# Patient Record
Sex: Female | Born: 1950 | Race: White | Hispanic: No | Smoking: Never smoker
Health system: Southern US, Community
[De-identification: ages and names within clinical notes are randomized; demographics above are authoritative.]

## PROBLEM LIST (undated history)

## (undated) DIAGNOSIS — E785 Hyperlipidemia, unspecified: Secondary | ICD-10-CM

## (undated) DIAGNOSIS — K219 Gastro-esophageal reflux disease without esophagitis: Secondary | ICD-10-CM

## (undated) DIAGNOSIS — Z8719 Personal history of other diseases of the digestive system: Secondary | ICD-10-CM

## (undated) DIAGNOSIS — I10 Essential (primary) hypertension: Secondary | ICD-10-CM

## (undated) DIAGNOSIS — M797 Fibromyalgia: Secondary | ICD-10-CM

## (undated) DIAGNOSIS — M1990 Unspecified osteoarthritis, unspecified site: Secondary | ICD-10-CM

## (undated) HISTORY — DX: Fibromyalgia: M79.7

## (undated) HISTORY — DX: Hyperlipidemia, unspecified: E78.5

## (undated) HISTORY — DX: Gastro-esophageal reflux disease without esophagitis: K21.9

## (undated) HISTORY — PX: TUBAL LIGATION: SHX77

## (undated) HISTORY — PX: UPPER GASTROINTESTINAL ENDOSCOPY: SHX188

## (undated) HISTORY — PX: CARPAL TUNNEL RELEASE: SHX101

## (undated) HISTORY — PX: OTHER SURGICAL HISTORY: SHX169

## (undated) HISTORY — PX: JOINT REPLACEMENT: SHX530

## (undated) HISTORY — PX: REDUCTION MAMMAPLASTY: SUR839

## (undated) HISTORY — PX: CHOLECYSTECTOMY: SHX55

## (undated) HISTORY — DX: Essential (primary) hypertension: I10

---

## 1997-12-29 ENCOUNTER — Other Ambulatory Visit: Admission: RE | Admit: 1997-12-29 | Discharge: 1997-12-29 | Payer: Self-pay | Admitting: Otolaryngology

## 1998-10-02 ENCOUNTER — Other Ambulatory Visit: Admission: RE | Admit: 1998-10-02 | Discharge: 1998-10-02 | Payer: Self-pay | Admitting: Family Medicine

## 1998-11-11 ENCOUNTER — Encounter: Payer: Self-pay | Admitting: Family Medicine

## 1998-11-11 ENCOUNTER — Encounter: Admission: RE | Admit: 1998-11-11 | Discharge: 1998-11-11 | Payer: Self-pay | Admitting: Family Medicine

## 1999-01-18 HISTORY — PX: COLONOSCOPY: SHX174

## 1999-06-23 ENCOUNTER — Encounter: Payer: Self-pay | Admitting: Emergency Medicine

## 1999-06-23 ENCOUNTER — Emergency Department (HOSPITAL_COMMUNITY): Admission: EM | Admit: 1999-06-23 | Discharge: 1999-06-23 | Payer: Self-pay | Admitting: Emergency Medicine

## 1999-11-02 ENCOUNTER — Other Ambulatory Visit: Admission: RE | Admit: 1999-11-02 | Discharge: 1999-11-02 | Payer: Self-pay | Admitting: Family Medicine

## 1999-11-16 ENCOUNTER — Other Ambulatory Visit: Admission: RE | Admit: 1999-11-16 | Discharge: 1999-11-16 | Payer: Self-pay | Admitting: Gastroenterology

## 1999-11-16 ENCOUNTER — Encounter (INDEPENDENT_AMBULATORY_CARE_PROVIDER_SITE_OTHER): Payer: Self-pay

## 1999-11-17 ENCOUNTER — Ambulatory Visit (HOSPITAL_BASED_OUTPATIENT_CLINIC_OR_DEPARTMENT_OTHER): Admission: RE | Admit: 1999-11-17 | Discharge: 1999-11-17 | Payer: Self-pay | Admitting: *Deleted

## 1999-11-23 ENCOUNTER — Encounter: Payer: Self-pay | Admitting: Family Medicine

## 1999-11-23 ENCOUNTER — Encounter: Admission: RE | Admit: 1999-11-23 | Discharge: 1999-11-23 | Payer: Self-pay | Admitting: Family Medicine

## 2000-04-04 ENCOUNTER — Other Ambulatory Visit: Admission: RE | Admit: 2000-04-04 | Discharge: 2000-04-04 | Payer: Self-pay | Admitting: Family Medicine

## 2000-08-17 ENCOUNTER — Encounter: Payer: Self-pay | Admitting: Internal Medicine

## 2000-08-17 ENCOUNTER — Ambulatory Visit (HOSPITAL_COMMUNITY): Admission: RE | Admit: 2000-08-17 | Discharge: 2000-08-17 | Payer: Self-pay | Admitting: Internal Medicine

## 2000-09-05 ENCOUNTER — Encounter: Admission: RE | Admit: 2000-09-05 | Discharge: 2000-09-05 | Payer: Self-pay | Admitting: Family Medicine

## 2000-09-05 ENCOUNTER — Encounter: Payer: Self-pay | Admitting: Family Medicine

## 2001-02-05 ENCOUNTER — Other Ambulatory Visit: Admission: RE | Admit: 2001-02-05 | Discharge: 2001-02-05 | Payer: Self-pay | Admitting: Dermatology

## 2001-04-23 ENCOUNTER — Other Ambulatory Visit: Admission: RE | Admit: 2001-04-23 | Discharge: 2001-04-23 | Payer: Self-pay | Admitting: Family Medicine

## 2001-10-19 ENCOUNTER — Encounter: Admission: RE | Admit: 2001-10-19 | Discharge: 2001-10-19 | Payer: Self-pay | Admitting: Family Medicine

## 2001-10-19 ENCOUNTER — Encounter: Payer: Self-pay | Admitting: Family Medicine

## 2001-11-15 ENCOUNTER — Encounter: Payer: Self-pay | Admitting: Rheumatology

## 2001-11-15 ENCOUNTER — Encounter (HOSPITAL_COMMUNITY): Admission: RE | Admit: 2001-11-15 | Discharge: 2001-12-15 | Payer: Self-pay | Admitting: Rheumatology

## 2002-02-21 ENCOUNTER — Encounter (HOSPITAL_COMMUNITY): Admission: RE | Admit: 2002-02-21 | Discharge: 2002-03-23 | Payer: Self-pay | Admitting: Rheumatology

## 2002-04-25 ENCOUNTER — Encounter: Payer: Self-pay | Admitting: Rheumatology

## 2002-04-25 ENCOUNTER — Encounter (HOSPITAL_COMMUNITY): Admission: RE | Admit: 2002-04-25 | Discharge: 2002-05-25 | Payer: Self-pay | Admitting: Rheumatology

## 2002-05-14 ENCOUNTER — Other Ambulatory Visit: Admission: RE | Admit: 2002-05-14 | Discharge: 2002-05-14 | Payer: Self-pay | Admitting: Obstetrics and Gynecology

## 2002-05-22 ENCOUNTER — Other Ambulatory Visit: Admission: RE | Admit: 2002-05-22 | Discharge: 2002-05-22 | Payer: Self-pay | Admitting: Dermatology

## 2002-07-25 ENCOUNTER — Encounter (HOSPITAL_COMMUNITY): Admission: RE | Admit: 2002-07-25 | Discharge: 2002-08-24 | Payer: Self-pay | Admitting: Rheumatology

## 2002-09-12 ENCOUNTER — Encounter (HOSPITAL_COMMUNITY): Admission: RE | Admit: 2002-09-12 | Discharge: 2002-10-12 | Payer: Self-pay | Admitting: Rheumatology

## 2002-11-07 ENCOUNTER — Encounter (HOSPITAL_COMMUNITY): Admission: RE | Admit: 2002-11-07 | Discharge: 2002-12-07 | Payer: Self-pay | Admitting: Rheumatology

## 2003-05-19 ENCOUNTER — Encounter: Admission: RE | Admit: 2003-05-19 | Discharge: 2003-05-19 | Payer: Self-pay | Admitting: Obstetrics and Gynecology

## 2003-06-05 ENCOUNTER — Other Ambulatory Visit: Admission: RE | Admit: 2003-06-05 | Discharge: 2003-06-05 | Payer: Self-pay | Admitting: Obstetrics and Gynecology

## 2004-06-21 ENCOUNTER — Other Ambulatory Visit: Admission: RE | Admit: 2004-06-21 | Discharge: 2004-06-21 | Payer: Self-pay | Admitting: Obstetrics and Gynecology

## 2004-06-28 ENCOUNTER — Encounter: Admission: RE | Admit: 2004-06-28 | Discharge: 2004-06-28 | Payer: Self-pay | Admitting: Obstetrics and Gynecology

## 2005-07-19 ENCOUNTER — Encounter: Admission: RE | Admit: 2005-07-19 | Discharge: 2005-07-19 | Payer: Self-pay | Admitting: Obstetrics and Gynecology

## 2005-08-03 ENCOUNTER — Encounter: Admission: RE | Admit: 2005-08-03 | Discharge: 2005-08-03 | Payer: Self-pay | Admitting: Obstetrics and Gynecology

## 2007-01-22 ENCOUNTER — Emergency Department (HOSPITAL_COMMUNITY): Admission: EM | Admit: 2007-01-22 | Discharge: 2007-01-23 | Payer: Self-pay | Admitting: Family Medicine

## 2008-09-08 ENCOUNTER — Ambulatory Visit (HOSPITAL_COMMUNITY): Admission: RE | Admit: 2008-09-08 | Discharge: 2008-09-08 | Payer: Self-pay | Admitting: Obstetrics and Gynecology

## 2009-05-15 ENCOUNTER — Emergency Department (HOSPITAL_COMMUNITY): Admission: EM | Admit: 2009-05-15 | Discharge: 2009-05-15 | Payer: Self-pay | Admitting: Emergency Medicine

## 2009-08-31 ENCOUNTER — Ambulatory Visit: Payer: Self-pay | Admitting: Obstetrics & Gynecology

## 2009-09-01 ENCOUNTER — Encounter: Payer: Self-pay | Admitting: Obstetrics & Gynecology

## 2009-09-01 LAB — CONVERTED CEMR LAB

## 2009-09-04 ENCOUNTER — Ambulatory Visit (HOSPITAL_COMMUNITY): Admission: RE | Admit: 2009-09-04 | Discharge: 2009-09-04 | Payer: Self-pay | Admitting: Family Medicine

## 2009-09-11 ENCOUNTER — Ambulatory Visit (HOSPITAL_COMMUNITY): Admission: RE | Admit: 2009-09-11 | Discharge: 2009-09-11 | Payer: Self-pay | Admitting: Obstetrics and Gynecology

## 2009-09-17 ENCOUNTER — Ambulatory Visit: Payer: Self-pay | Admitting: Obstetrics & Gynecology

## 2010-04-06 LAB — URINALYSIS, ROUTINE W REFLEX MICROSCOPIC
Glucose, UA: NEGATIVE mg/dL
Ketones, ur: NEGATIVE mg/dL
Nitrite: NEGATIVE
Urobilinogen, UA: 0.2 mg/dL (ref 0.0–1.0)
pH: 6.5 (ref 5.0–8.0)

## 2010-04-06 LAB — DIFFERENTIAL
Basophils Absolute: 0 10*3/uL (ref 0.0–0.1)
Basophils Relative: 1 % (ref 0–1)
Eosinophils Relative: 0 % (ref 0–5)
Lymphs Abs: 3.1 10*3/uL (ref 0.7–4.0)
Monocytes Absolute: 0.6 10*3/uL (ref 0.1–1.0)
Neutro Abs: 4.4 10*3/uL (ref 1.7–7.7)

## 2010-04-06 LAB — BASIC METABOLIC PANEL
Chloride: 106 mEq/L (ref 96–112)
Creatinine, Ser: 0.79 mg/dL (ref 0.4–1.2)
GFR calc Af Amer: >60 mL/min (ref >60)
Potassium: 3.7 mEq/L (ref 3.5–5.1)

## 2010-04-06 LAB — CBC
MCHC: 35.5 g/dL (ref 30.0–36.0)
MCV: 88.4 fL (ref 78.0–100.0)
WBC: 8.2 10*3/uL (ref 4.0–10.5)

## 2010-04-06 LAB — URINE MICROSCOPIC-ADD ON

## 2010-06-01 NOTE — Consult Note (Signed)
NAMECHARLIEE, KRENZ NO.:  [id number]   MEDICAL RECORD NO.:  [id number]          PATIENT TYPE:  EMS   LOCATION:  [facility]JO                         FACILITY:  [facility]   PHYSICIAN:  Alvy Beal, MD    DATE OF BIRTH:  04-25-1950   DATE OF CONSULTATION:  01/23/2007  DATE OF DISCHARGE:                                 CONSULTATION   HISTORY:  She was seen at urgent care after a fall while cleaning out  the gutters.  She presented there to Dr. Penni Bombard with complaints of  significant sacral pain.  X-rays demonstrated a sacral S4 fracture.  I  was asked to evaluate the patient.  I reviewed the films and I indicated  that she needed a CT scan.  The patient was sent to the emergency room,  a CT scan was done, and I evaluated the patient in the ER.   At this point in time, she has no other significant complaints, just the  gluteal pain.   PAST MEDICAL SURGICAL FAMILY SOCIAL HISTORY:  She has no known drug  allergies.  She has a history of hypertension, arthritis, hiatal hernia,  hyperlipidemia.  She has had a carpal tunnel release, cholecystectomy,  tubal ligation.  She is a nonsmoker.   She is on no current medications.   Per Dr. Markus Jarvis review, her rectal tone was normal, perianal sensation  was intact.   PHYSICAL EXAMINATION:  On my physical examination, she is alert and  oriented x3.  She has no upper or lower extremity tenderness.  She has  full range of motion in the hip, knee and ankle with no pain.  She is  neurologically intact with no focal motor or sensory deficits.  She has  a moderate-sized hematoma on the right gluteal interfold which is  tender.  There is a small superficial abrasion.   LABORATORY DATA:  CT scan demonstrates an angulated S4 fracture that  does involve the sacral foramen, it is a transverse fracture but is  minimally displaced.   ASSESSMENT AND RECOMMENDATIONS:  At this point in time, the patient is  neurologically intact with no  evidence of any sacral/neurological  deficits.  At this point, she will be discharged to home with  appropriate instructions.  She will ice, try and keep from direct  pressure over the site.  She already has crutches.  She will use them as  needed for assisted ambulation.  She will follow-up with me on Friday  for repeat wound evaluation to ensure that the hematoma is not  expanding.  If she has incontinence of bowel and bladder or pain with  bowel movements or increasing difficulties, she knows she can contact my  office and be seen sooner.      Alvy Beal, MD  Electronically Signed     DDB/MEDQ  D:  01/23/2007  T:  01/23/2007  Job:  [id number]

## 2010-06-04 NOTE — Consult Note (Signed)
NAMEMYLEE, FALIN NO.:  [id number]   MEDICAL RECORD NO.:  [id number]                   PATIENT TYPE:   LOCATION:                                       FACILITY:   PHYSICIAN:  Aundra Dubin, M.D.            DATE OF BIRTH:   DATE OF CONSULTATION:  11/07/2002  DATE OF DISCHARGE:                                   CONSULTATION   CHIEF COMPLAINT:  Hands, insomnia.   Ms. Picariello returns reporting that she is doing fairly well.  She has mild  achiness to the knees and hands.  She finds the Clinoril works well for  these areas.  Her weight is stable although up one pound.  There is no URIs,  fever, cough, shortness of breath, or rashes.  She sleeps well with the  Ambien.   MEDICATIONS:  1. Ambien 10 mg h.s.  2. Clinoril 200 mg every day - b.i.d. p.r.n.  3. Pravachol every day.  4. Aciphex 20 mg every day.  5. Effexor.  6. Tylenol #3 p.r.n.  7. HCTZ every day.   PHYSICAL EXAMINATION:  Weight 164 pounds, blood pressure 120/60,  respirations 16.  GENERAL:  She appears well.  LUNGS:  Clear.  NECK:  Negative JVD.  HEART:  Regular.  LOWER EXTREMITIES:  No edema.  MUSCULOSKELETAL:  There is some mild degenerative nodularity to the DIPs and  slightly to the PIPs.  These areas are mildly tender.  The MCPs have  fullness and some mild tenderness.  The wrists are non-swollen and  nontender.  Elbows, shoulders, knees, ankles and feet are all cool, non-  swollen, and nontender with full range of motion.   ASSESSMENT AND PLAN:  1. Mild osteoarthritis.  This is becoming more evident to the fingers.  I do     not believe that she has an inflammatory arthritis at this time.  She     will continue with the Clinoril every day - b.i.d.  She is cautioned that     this medicine can bother her stomach.  The Aciphex will also help against     this potential side effect.  2. Insomnia.  She will continue with the Ambien p.r.n.   I believe she has stable  problems.  I have worked with her for about one  year.  She does not have an inflammatory arthritis.  I will return her care  back to her PCP.  She will return on a p.r.n. basis.      ___________________________________________                                            Aundra Dubin, M.D.   WWT/MEDQ  D:  11/07/2002  T:  11/07/2002  Job:  [id number]   cc:  Magnus Sinning. Dimple Casey, M.D.  [address] [place]  [place] [address]  Fax: [phone]

## 2010-06-04 NOTE — Consult Note (Signed)
NAME:  [name], [name]                         ACCOUNT NO.:  [id number]   MEDICAL RECORD NO.:  [id number]                   PATIENT TYPE:  PREC   LOCATION:                                       FACILITY:  [facility]   PHYSICIAN:  Aundra Dubin, M.D.            DATE OF BIRTH:  November 23, 1950   DATE OF CONSULTATION:  04/25/2002  DATE OF DISCHARGE:                                   CONSULTATION   CHIEF COMPLAINT:  Hand, neck and knee pain.   HISTORY OF PRESENT ILLNESS:  The patient returns reporting that the Ambien  is helping her to sleep.  However, she is still aching and great deal around  the shoulders and neck.  Sometimes, the pain will keep her awake.  She tries  heat to this area.  She is also aching in the hands and she feels that they  are somewhat swollen.  Her knees also ache, but there has been no swelling.  Her weight is down 10 pounds.  There has been no URI, fever, cough, rashes,  chest pain or shortness of breath.   MEDICATIONS:  1. Tylenol #3 one daily  2. Ambien 10 mg q.h.s.  3. Pravachol 80 mg daily.  4. Aciphex 20 mg b.i.d.  5. Effexor 75 mg daily.  6. HCTZ 12.5 mg daily.  7. ASA 81 mg daily.  8. Valium 10 mg p.r.n.  9. Ditropan daily.  10.      Colace p.r.n.   PHYSICAL EXAMINATION:  VITAL SIGNS:  Weight 162 pounds, blood pressure  100/60, respirations 16.  GENERAL APPEARANCE:  No distress.  LUNGS:  Clear.  HEART:  Regular, no murmur.  EXTREMITIES:  Lower extremities, no edema.  Trigger points to the elbow,  shoulder, occiput, anterior chest and upper paraspinous muscles have mild  discomfort and wincing.  MUSCULOSKELETAL:  The hands show slight degenerative nodularity to several  DIP's.  They were tender.  PIPs and MCPs were non-swollen and nontender.  Wrist, elbows, shoulders, knees, ankles and feet have a good range of  motion, all were cool and nontender.   ASSESSMENT/PLAN:  1. Polyarthralgia and insomnia.  I have discussed again that her symptoms  suggest a fibromyalgia-type syndrome.  She will continue with Ambien and     Tylenol #3 as above.  2. Possible OA of hands.  We will have her hands x-rayed.  I will try on her     Clinoril 200 mg b.i.d. presently.  3.     Neck pain.  She has significantly tight, sore muscles.  We will have her go      to physical therapy for one or two sessions for her to learn a range of     motion to try and help with this.   FOLLOWUP:  She will return in three months.  Aundra Dubin, M.D.    WWT/MEDQ  D:  04/25/2002  T:  04/26/2002  Job:  [id number]   cc:   Denny Peon. Ulice Brilliant, D.P.M.  [address]  [place]  [place] [address]  Fax: [phone]   Magnus Sinning. Dimple Casey, M.D.  [address] [place]  [place] [address]  Fax: [phone]

## 2010-06-04 NOTE — Consult Note (Signed)
NAME:  ALAURA, [name] NO.:  [id number]   MEDICAL RECORD NO.:  [id number]                   PATIENT TYPE:   LOCATION:                                       FACILITY:   PHYSICIAN:  Aundra Dubin, M.D.            DATE OF BIRTH:   DATE OF CONSULTATION:  07/25/2002  DATE OF DISCHARGE:                                   CONSULTATION   CHIEF COMPLAINT:  Hands and feet, hips and back.   [name] returns informing me that she is still aching in a great deal of  joint areas.  Her elbows and hands are some of the worst areas.  She did go  to physical therapy and her neck is slightly better but about the same.  I  reviewed her hand x-rays which showed slight soft tissue swelling at the  PIPs and the PIPs have slight narrowing.  The MCPs are unremarkable.  There  could be slight DIP joint narrowing.  Feet are also unremarkable except for  some minor degenerative changes at the first MTPs.  Her weight is down 3  pounds.  There has been no fever, cough, URIs, rashes, or significant  headaches.   MEDICINES:  1. Ditropan 5 mg daily.  2. Pravachol 80 mg daily.  3. Aciphex 20 mg b.i.d.  4. Effexor 150 mg daily.  5. Tylenol No. 3 p.r.n.  6. Ambien p.r.n.   PHYSICAL EXAMINATION:  VITAL SIGNS:  Weight 159 pounds, blood pressure  120/70, respirations 16.  GENERAL:  No distress.  SKIN:  Clear.  LUNGS:  Clear.  HEART:  Regular with no murmur.  MUSCULOSKELETAL:  The hands are warm bilaterally.  There seems to be  swelling of the MCPs and she is tender at these areas.  Also, the PIPs  appear more swollen than I have seen at other times and they are tender.  The wrists also have some mild swelling and are tender.  Elbows are tender  at the trigger points and at the epicondyles.  Shoulders move well without  stiffness.  Knees are cool and flex easily to 130 degrees.  The ankles were  nontender.  There was mild MTP tenderness.   ASSESSMENT AND PLAN:  1.  Questionable polyarthritis versus continued polyarthralgia.  I am     electing to place her on low-dose prednisone to see how she will respond.     She will take prednisone 30 mg for three days, then 20 mg and 15 mg each     for one-week intervals, and lower to 10 mg a day.  She is cautioned that     prednisone can increase her appetite and be associated with weight gain.     She is advised to snack on whole fruits to avoid weight gain.  As I     reviewed the x-     rays of her hands, there is a  possibility of slight periarticular     osteopenia of the MCPs in addition.  2. Insomnia.  She will continue with p.r.n. Ambien.   She will return in three to four weeks.                                               Aundra Dubin, M.D.    WWT/MEDQ  D:  07/25/2002  T:  07/25/2002  Job:  [id number]   cc:   [name], M.D.  [address] [place]  [place] [address]  Fax: [phone]

## 2010-06-04 NOTE — Op Note (Signed)
[place]  Patient:    [name], [name]                      MRN: [id number] Proc. Date: 11/17/99 Adm. Date:  [id number] Attending:  Kendell Bane                           Operative Report  PREOPERATIVE DIAGNOSIS:  Left carpal tunnel syndrome.  POSTOPERATIVE DIAGNOSIS:  Left carpal tunnel syndrome.  PROCEDURE:  Decompression of median nerve, left carpal tunnel.  SURGEON:  Lowell Bouton, M.D.  ANESTHESIA:  Half percent Marcaine local with sedation.  OPERATIVE FINDINGS:  The patient had a fairly narrow carpal canal.  There were no masses present and the motor branch was intact.  DESCRIPTION OF PROCEDURE:  Under 0.5% Marcaine local anesthesia with the tourniquet on the left arm, the left hand was prepped and draped in the usual fashion.  After exsanguinating the limb, the tourniquet was inflated to 250 mmHg.  A 3 cm longitudinal incision was made in the palm just ulnar to the thenar crease.  Sharp dissection was carried through the subcutaneous tissues and bleeding points were coagulated.  Blunt dissection was carried through the superficial palmar fascia ______ to the transverse carpal ligament.  A hemostat was then placed in the carpal canal under the transverse carpal ligament, up against the hook of the hamate.  The transverse carpal ligament was then divided sharply on the ulnar border of the median nerve.  The proximal end of the ligament was divided with the scissors after dissecting the nerve away from the under surface of the ligament.  The carpal canal was then palpated and was found to be adequately decompressed.  The nerve was examined and the motor branch was identified.  There were no masses present in the carpal tunnel.  The wound was irrigated with saline and the skin was closed with 4-0 nylon suture.  Sterile dressings were applied followed by a volar wrist splint.  The patient tolerated the procedure  well and went to the recovery room awake, stable, and in good condition. DD:  11/17/99 TD:  11/17/99 Job: [id number]

## 2010-06-04 NOTE — Group Therapy Note (Signed)
NAMEMARGIA, WIESEN NO.:  [id number]   MEDICAL RECORD NO.:  [id number]                   PATIENT TYPE:   LOCATION:                                       FACILITY:  [facility]   PHYSICIAN:  Aundra Dubin, M.D.            DATE OF BIRTH:   DATE OF PROCEDURE:  09/12/2002  DATE OF DISCHARGE:                                   PROGRESS NOTE   CHIEF COMPLAINT:  Polyarthralgia versus polyarthritis.   HISTORY:  As I put Ms. Whitsel on the prednisone she improved.  She felt that  she was nearly 100% better.  She did not return as scheduled and cancelled  and moved an appointment.  She has now run out of the prednisone and says  she is aching more.  Her hands and wrists seem to be the worst, along with  across her toes.  She has lost two pounds during this period of being on  prednisone.  She continues to have some difficulties with sleep.  She finds  that Clinoril helps her knees.  There has been no URI, fever, cough,  shortness of breath, polyuria or polydipsia.   MEDICATIONS:  1. Ditropan 5 mg daily.  2. Clinoril 200 mg b.i.d.  3. Pravachol 80 mg daily.  4. Effexor 150 mg daily.   PHYSICAL EXAMINATION:  VITAL SIGNS:  Weight 157 pounds, blood pressure  120/70, respirations 18.  GENERAL:  No distress.  LUNGS:  Clear.  HEART:  Regular.  No murmur.  EXTREMITIES:  Lower extremities:  No edema.  MUSCULOSKELETAL:  There is fullness and also mild tenderness across the  MCPs.  There may be slight warmth.  The wrist also had mild tenderness.  She  has nodularity to the DIPs.  Elbows, shoulders good range of motion.  Back  nontender.  Knees flex easily to 130 degrees, and they are nontender.  The  ankles are nontender.  She has bunion formation and moderate tenderness  across the MTPs.   ASSESSMENT/PLAN:  1. Polyarthralgia versus polyarthritis:  As I have discussed with Ms. Boulanger     each time that I have set an appointment, something has occurred and I  see her back at a longer point.  I am expecting her to keep her     appointments on time as best she can.  I would not place her back on     prednisone at this time, but I am having some concerns that this is an     inflammatory arthritis that not only needs prednisone for a period of     time but possibly a rheumatic medicine.  She could possibly have     rheumatoid arthritis.  This seems to have developed over time, and it was     felt that this was more of a fibromyalgia-type process until the last     couple of times of  seeing her.  She will continue with the Clinoril.  2. Insomnia:  I have written for Ambien 10 mg at bedtime.   I have encouraged her to return and have set up an appointment for two  months.                                               Aundra Dubin, M.D.    WWT/MEDQ  D:  09/12/2002  T:  09/12/2002  Job:  [id number]   cc:   Magnus Sinning. Dimple Casey, M.D.  [address] [place]  [place] [address]  Fax: [phone]

## 2010-06-04 NOTE — Consult Note (Signed)
NAME:  [name], [name].                        ACCOUNT NO.:  [id number]   MEDICAL RECORD NO.:  [id number]                   PATIENT TYPE:   LOCATION:                                       FACILITY:   PHYSICIAN:  Aundra Dubin, M.D.            DATE OF BIRTH:   DATE OF CONSULTATION:  02/21/2002  DATE OF DISCHARGE:                                   CONSULTATION   CHIEF COMPLAINT:  Feet, polyarthralgia.   HISTORY OF PRESENT ILLNESS:  This patient did not followup for her  appointment in early January.  She is here today.  She did not find that the  Flexeril greatly helped with her sleep.  She still had some nonrestorative  sleep, but this is not as pronounced as it was.  The injection the right  third trigger finger helped a great deal and this is not bothering her.  She  is still aching a great deal diffusely around the shoulder neck, low back  and the legs.  Her feet continue to ache.  I reviewed the bilateral feet x-  rays which show slight degenerative changes to the first metatarsal head.  There is mild calcaneal spurring.  Her weight is up 6 pounds.  There is no  fever.  She has had some headaches and relates this to sinus problems.  She  has been on antibiotics for a tooth extraction. Her energy level is  moderately low.  Her knees ache a great deal.  She feels that she is swollen  to the hands and points to the MCPs.   MEDICATIONS:  1. Pravachol 80 mg daily.  2. Aciphex 20 mg b.i.d.  3. Effexor 75 mg daily.  4. Tylenol #3 p.r.n.  5. HCTZ 12.5 mg daily.  6. ASA 81 mg daily.  7. Valium 10 mg p.r.n.  8. Ditropan daily.  9. Colace p.r.n.   PAST MEDICAL HISTORY, ALLERGIES, SOCIAL HISTORY:  The past medical history,  drug intolerances are all reviewed and unchanged from 11/15/01.   PHYSICAL EXAMINATION:  VITAL SIGNS:  Weight 173 pounds.  Blood pressure  108/64, respirations 16, pulse 102.  GENERAL:  No distress.  SKIN:  Clear.  HEENT:  PERL/EOMI.  Mouth clear.  NECK:  Negative JVD.  Normal thyroid.  LUNGS:  Clear.  HEART:  Regular.  No murmurs.  ABDOMEN:  Negative HSM, nontender.  MUSCULOSKELETAL: The hands show mild degenerative changes to the DIPs with  nodularity.  The PIPs are not swollen and nontender.  There is fullness and  some tenderness across the MCPs.  The wrists are nonswollen and nontender.  Elbows and shoulders have good range of motion.  The neck is mildly stiff.  He has slight tenderness. Trigger points of the elbows, shoulders, neck,  __________ anterior chest, or paraspinal muscles are tender.  The muscles of  the neck are tight.  The low back was also tender.  The knees had  trace  warmth.  They were not swollen, but stiff.  The ankles and feet had  tenderness, but no swelling.  NEUROLOGIC:  Strength 5/5.  DTRs 2+ throughout negative SLRs.   ASSESSMENT AND PLAN:  1. Mild OA.  Seeing the nodularity to the DIPs suggests this.  She has been     on Vioxx in the past, but has been taken off for this.  I am going to     allow her to use Tylenol No. 3 b.i.d. because this has worked better for     her.  She felt that the Darvocet did not particularly help.  2. Polyarthralgia and insomnia.  She has positive trigger points.  I suspect     that there is some degree of a fibromyalgia-type process going on.  I     placed her on Ambien 10 mg h.s. to see how this may help.  3. Foot pain.  This is a separate issue and I suspected that Dr. Ulice Brilliant will     be able to help her more with these types of problems than I will.  May     consider adding an NSAID on return.  4. Trigger finger.  This is now resolved.  5. Positive ANA.  6. She will return in 2 months.                                               Aundra Dubin, M.D.    WWT/MEDQ  D:  02/21/2002  T:  02/21/2002  Job:  [id number]   cc:   Denny Peon. Ulice Brilliant, D.P.M.  [address]  [place]  [place] [address]  Fax: [phone]   Magnus Sinning. Dimple Casey, M.D.  [address] [place]  [place]  [address]  Fax: [phone]

## 2010-06-04 NOTE — Consult Note (Signed)
NAME:  [name], [name].                        ACCOUNT NO.:  [id number]   MEDICAL RECORD NO.:  [id number]                   PATIENT TYPE:   LOCATION:                                       FACILITY:   PHYSICIAN:  Aundra Dubin, M.D.            DATE OF BIRTH:   DATE OF CONSULTATION:  11/15/2001  DATE OF DISCHARGE:                                   CONSULTATION   CHIEF COMPLAINT:  Arthritis.   Dear Dr. Ulice Brilliant:   Thank you for this consultation.  [name] is a 60 year old white female  who has a significant number of varied musculoskeletal problems.  As you are  well aware, she has hurt in her feet for many years and this is as much as  10-15 years.  She hurts with walking.  She particularly hurts in the big  toes.  This is a chronic everyday type of achiness and does not suggest  gout.  She had previously been treated by Dr. Pricilla Holm and Dr. Barnett Applebaum.  She  feels that the feet have never been swollen.  Another problem is a likely  trigger finger to the right third MCP.  In the last two months, she has had  to pry the finger open about 6-8 times.  She feels that the hands have been  swollen and hurts mostly over the PIP's.  She is here also for an evaluation  of possible lupus.  Labs on September 07, 2001 showed an ANA 1:80, speckle.  She had a negative RF and an ESR was 17 and a CRP was 0.2.   Early on in our conversation, she says she hurt all over.  She aches in the  neck and shoulder and she feels that there is some grinding to the neck.  She also hurts to the lateral right hip area and this can hurt quite  severely.  Sometimes, driving will make this worse.  She had been on Zocor  but this was stopped six weeks ago.   She describes her energy level as being low and terrible.  She has  frequent nonrestorative sleep because she cannot become comfortable.  She is  stiff in the mornings for 2-3 hours.  There has been no fever or rashes.  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  There has been no  psoriasis, photosensitivity, oral ulcers, alopecia, pleurisy, or Raynaud's.  She rarely has headaches.  There is no problem with diarrhea, constipation,  blood, or mucus to the bowel movement.  She has chest pain but attributes  this to her hiatal hernia.  No shortness of breath.  Her low back hurts a  mild degree.   PAST MEDICAL/SURGICAL HISTORY:  1. Lichen planus.  2. Hypertension.  3. Bilateral carpal tunnel surgery in 1997, 2000.  4. Cholecystectomy in 1997.  5. Tubal ligation in 1981.  6. Hypercholesterolemia.   MEDICATIONS:  1. Pravachol  40 mg q.d.  2. Mobic 7.5 mg p.r.n.  3. Aciphex 20 mg b.i.d.  4. Effexor 75 mg q.d.  5. Tylenol No.3 p.r.n.  6. Benadryl p.r.n.  7. Folic acid 1 mg q.d.  8. Aspirin 81 mg q.d.  9. Valium 10 mg p.r.n.   DRUG INTOLERANCES:  MERIDIA.   FAMILY HISTORY:  Her father is 70 years of age and has had a CABG.  Her  mother is 66 years of age and has had an MI and surgery to one of her heart  valves.   SOCIAL HISTORY:  She is a native of [facility].  She has been widowed  for 14 years.  She has two grown daughters and grandchildren.  She never  smoked and drinks a glass of wine occasionally.  She is presently not  working.   PHYSICAL EXAMINATION:  VITAL SIGNS:  Weight 166 pounds, blood pressure  130/80, respirations 16.  GENERAL:  She is healthy appearing.  SKIN:  There is no malar rash, nailfold dilatation, or sclerodermal changes.  HEENT:  Normal hair pattern.  PERL/EOMI.  Mouth clear.  NECK:  Negative JVD.  Normal thyroid.  LUNGS:  Clear.  HEART:  Regular.  No murmur.  ABDOMEN:  Negative HSM, nontender.  MUSCULOSKELETAL:  She has some triggering and click to the right third  palmar MCP consistent with a trigger finger.  The MCP's appear vaguely full  versus swollen.  The PIP's have mild possible swelling.  Wrists, elbows,  shoulders:  Good range of motion.  The neck had some mild stiffness but  was  nontender.  Trigger points at the elbows, shoulder, neck, occiput, anterior  chest, and upper paraspinous muscles were tender.  The low back was  nontender.  She had good forward flexion and could touch her toes fairly  easily.  Hips, knees, ankles all had a good range of motion and showed no  synovitis.  The dorsal right foot was slightly warm but not tender.  Movement of the big toes was quite tender but there was no real tenderness.  Palpation across the MTP's showed mild tenderness but this may have been  pain at the first toes.  Palpation into the midfoot also found tenderness.  NEUROLOGIC:  Strength is 5/5.  DTR's are 2+ throughout.  Negative SLR's.   PROCEDURE:  Right third trigger finger injection.  The skin was cleaned with  multiple alcohol swabs and cooled with ethyl chloride.  Using a 27-gauge  needle, 80 mg of Depo-Medrol and 0.25 cc of 2% lidocaine was injected.   ASSESSMENT AND PLAN:  1. Myalgias and polyarthralgia.  I am not seeing a great deal of swelling to     her joints, although there is some sense of swelling to the hands.  I     gave some consideration and discussed with her using prednisone; however,     the injection for the trigger finger is large enough that if this were an     inflammatory arthritis she would see some improvement transiently.  She     has mild to moderately positive trigger points and moderate insomnia.  I     am placing her on a small amount of Flexeril, which she might increase to     10-20 mg h.s. p.r.n.  I have also given her Darvocet.  She can use 1-2     tablets a day p.r.n.  She has been on several nonsteroidals without much     improvement.  2. Positive ANA.  At the present time, I would not diagnosis lupus and have     some mild sense that she could have an inflammatory arthritis.  I will     need to follow her course to see if anything changes.  She is 60 years of    age, which starts to make a diagnosis of lupus somewhat less  likely.  I     am not seeing rashes and other symptoms to greatly suggest this.  3. Right trigger finger.  This has been injected, as above.  4.     Foot pain.  I will have her feet x-rayed to evaluate for arthritic changes.   5. Hypercholesterolemia.  I suspect the type of achiness that she has had is     not related to prior use of Zocor.   She will return in about six weeks.                                               Aundra Dubin, M.D.    WWT/MEDQ  D:  11/15/2001  T:  11/16/2001  Job:  [id number]   cc:   Denny Peon. Ulice Brilliant, M.D.  [address]  [place]  [place] [address]  Fax: [phone]   Magnus Sinning. Dimple Casey, M.D.  [address] [place]  [place] [address]  Fax: [phone]

## 2010-08-02 ENCOUNTER — Other Ambulatory Visit (HOSPITAL_COMMUNITY): Payer: Self-pay | Admitting: Family Medicine

## 2010-08-02 DIAGNOSIS — Z139 Encounter for screening, unspecified: Secondary | ICD-10-CM

## 2010-09-14 ENCOUNTER — Ambulatory Visit (HOSPITAL_COMMUNITY)
Admission: RE | Admit: 2010-09-14 | Discharge: 2010-09-14 | Disposition: A | Discharge status: Home / Self Care | Payer: Self-pay | Source: Ambulatory Visit | Attending: Family Medicine | Admitting: Family Medicine

## 2010-09-14 DIAGNOSIS — Z139 Encounter for screening, unspecified: Secondary | ICD-10-CM

## 2011-01-03 ENCOUNTER — Other Ambulatory Visit: Payer: Self-pay | Admitting: Obstetrics and Gynecology

## 2011-01-03 DIAGNOSIS — Z1231 Encounter for screening mammogram for malignant neoplasm of breast: Secondary | ICD-10-CM

## 2011-01-04 ENCOUNTER — Ambulatory Visit (INDEPENDENT_AMBULATORY_CARE_PROVIDER_SITE_OTHER): Payer: Self-pay | Admitting: *Deleted

## 2011-01-04 ENCOUNTER — Ambulatory Visit (HOSPITAL_COMMUNITY): Payer: Self-pay | Attending: Obstetrics and Gynecology

## 2011-01-04 VITALS — BP 130/76 | HR 76 | Temp 97.7°F | Resp 16 | Ht 64.0 in | Wt 184.8 lb

## 2011-01-04 DIAGNOSIS — Z01419 Encounter for gynecological examination (general) (routine) without abnormal findings: Secondary | ICD-10-CM

## 2011-01-04 NOTE — Patient Instructions (Signed)
Taught patient how to perform BSE and gave educational materials to take home. Patient did not need a mammogram today since last mammogram was 09/14/10 and was normal. Last Pap smear was 04/13/08. Completed Pap smear today due to patient stating has a history of abnormal Pap smears. Patient thinks last Pap smear was normal. Let patient know will not need another Pap smear for 2-3 years if this Pap smear comes back normal.  Let patient know will follow up with her within the next couple weeks with results by letter or phone. Patient verbalized understanding.

## 2011-01-04 NOTE — Progress Notes (Signed)
Patient complained of 2 episodes of AUB in last 8 years. Patient stated did follow up after episodes and everything came back normal. Offered to refer patient for follow up and patient refused. Patient stated has followed up and will call me if has anymore bleeding.  Pap Smear: Completed Pap smear today. Last Pap smear was 04/13/08 and was normal. Patient stated has a history of an abnormal Pap smear with colposcopy and questionable LEEP for follow up. Pap smear result is in EPIC from 04/13/08.   Physical exam: Breasts Breasts symmetrical. No skin abnormalities bilateral breasts. No nipple retraction bilateral breasts. No nipple discharge bilateral breasts. No lymphadenopathy. No lumps palpated bilateral breasts. No complaints of pain or tenderness on palpation. Last mammogram was 09/14/10 at [facility] and normal.          Pelvic/Bimanual   Ext Genitalia No lesions, no swelling and no discharge observed on external genitalia.         Vagina Vagina pink and normal texture. No lesions or discharge observed in vagina.          Cervix Cervix is present. Cervix pink and of normal texture. No discharge observed from cervical os.       Uterus Uterus is present and palpable. Uterus in normal position and normal size.       Adnexae Bilateral ovaries present and palpable. No tenderness on palpation.        Rectovaginal No rectal exam completed today since patient had no rectal complaints. No skin abnormalities observed.

## 2011-01-19 ENCOUNTER — Encounter: Payer: Self-pay | Admitting: Obstetrics and Gynecology

## 2011-01-21 ENCOUNTER — Telehealth: Payer: Self-pay | Admitting: *Deleted

## 2011-01-21 NOTE — Telephone Encounter (Signed)
Patient called and left me voicemail to follow up on her Pap smear results. Called patient back and let her know her Pap smear was normal. Told patient she will need her next Pap smear in 3 years since this Pap smear was normal and her pap smear in 2010 was normal. Patient does not want to wait three years. Patient stated she has had an abnormal Pap smear in the past. Told patient she can come to one of our free Pap smear screenings in February 2014 if feels uncomfortable waiting 3 years. Also, reminded patient she will need a mammogram the end of August. Patient verbalized understanding.

## 2011-02-21 DIAGNOSIS — D499 Neoplasm of unspecified behavior of unspecified site: Secondary | ICD-10-CM | POA: Insufficient documentation

## 2011-02-25 DIAGNOSIS — E785 Hyperlipidemia, unspecified: Secondary | ICD-10-CM | POA: Insufficient documentation

## 2011-03-15 DIAGNOSIS — D480 Neoplasm of uncertain behavior of bone and articular cartilage: Secondary | ICD-10-CM | POA: Insufficient documentation

## 2011-10-04 ENCOUNTER — Other Ambulatory Visit: Payer: Self-pay | Admitting: Family Medicine

## 2011-10-21 ENCOUNTER — Other Ambulatory Visit: Payer: Self-pay | Admitting: Family Medicine

## 2011-10-21 DIAGNOSIS — Z1231 Encounter for screening mammogram for malignant neoplasm of breast: Secondary | ICD-10-CM

## 2011-11-10 ENCOUNTER — Ambulatory Visit (HOSPITAL_COMMUNITY)
Admission: RE | Admit: 2011-11-10 | Discharge: 2011-11-10 | Disposition: A | Discharge status: Home / Self Care | Payer: Self-pay | Source: Ambulatory Visit | Attending: Family Medicine | Admitting: Family Medicine

## 2011-11-10 DIAGNOSIS — Z1231 Encounter for screening mammogram for malignant neoplasm of breast: Secondary | ICD-10-CM

## 2012-10-18 ENCOUNTER — Other Ambulatory Visit: Payer: Self-pay | Admitting: Physician Assistant

## 2012-10-18 DIAGNOSIS — Z1231 Encounter for screening mammogram for malignant neoplasm of breast: Secondary | ICD-10-CM

## 2012-11-12 ENCOUNTER — Ambulatory Visit (HOSPITAL_COMMUNITY): Payer: Self-pay | Attending: Physician Assistant

## 2012-11-20 ENCOUNTER — Ambulatory Visit (HOSPITAL_COMMUNITY)
Admission: RE | Admit: 2012-11-20 | Discharge: 2012-11-20 | Disposition: A | Discharge status: Home / Self Care | Payer: BC Managed Care – PPO | Source: Ambulatory Visit | Attending: Physician Assistant | Admitting: Physician Assistant

## 2012-11-20 DIAGNOSIS — Z1231 Encounter for screening mammogram for malignant neoplasm of breast: Secondary | ICD-10-CM | POA: Insufficient documentation

## 2012-11-28 ENCOUNTER — Ambulatory Visit (INDEPENDENT_AMBULATORY_CARE_PROVIDER_SITE_OTHER): Payer: BC Managed Care – PPO | Admitting: General Practice

## 2012-11-28 ENCOUNTER — Ambulatory Visit (INDEPENDENT_AMBULATORY_CARE_PROVIDER_SITE_OTHER): Payer: BC Managed Care – PPO

## 2012-11-28 ENCOUNTER — Encounter (INDEPENDENT_AMBULATORY_CARE_PROVIDER_SITE_OTHER): Payer: Self-pay

## 2012-11-28 ENCOUNTER — Encounter: Payer: Self-pay | Admitting: General Practice

## 2012-11-28 VITALS — BP 147/87 | HR 93 | Temp 98.5°F | Ht 64.0 in | Wt 189.0 lb

## 2012-11-28 DIAGNOSIS — R0789 Other chest pain: Secondary | ICD-10-CM

## 2012-11-28 DIAGNOSIS — Z23 Encounter for immunization: Secondary | ICD-10-CM

## 2012-11-28 DIAGNOSIS — R071 Chest pain on breathing: Secondary | ICD-10-CM

## 2012-11-28 NOTE — Progress Notes (Signed)
  Subjective:    Patient ID: [name], female    DOB: Sep 08, 1950, 62 y.o.   MRN: [id number]  HPI Patient presents today for shingles vaccination. She reports a history of hypertension, GERD, and hyperlipidemia. She reports chronic health conditions are being managed by local health department. She is considering seeking a primary provider at this office. She reports having left lateral chest pain, that has been present for five years and continues to worsen. She denies shortness of breath or radiating of pain. Also reports periodic ankle swelling, which is noticed after sitting several hours at work. Reports once getting up and moving around, swelling decreases and is totally resolved upon waking in am.     Review of Systems  Constitutional: Negative for fever and chills.  Respiratory: Negative for chest tightness and shortness of breath.   Cardiovascular: Negative for chest pain and palpitations.  Gastrointestinal: Negative for vomiting, abdominal pain, diarrhea and constipation.  Genitourinary: Negative for hematuria and difficulty urinating.  Musculoskeletal: Negative for back pain.  Neurological: Negative for dizziness, weakness and headaches.  Psychiatric/Behavioral: The patient is not nervous/anxious.        Objective:   Physical Exam  Constitutional: She is oriented to person, place, and time. She appears well-developed and well-nourished.  Cardiovascular: Normal rate, regular rhythm and normal heart sounds.   Pulmonary/Chest: Effort normal and breath sounds normal.  Neurological: She is alert and oriented to person, place, and time.  Skin: Skin is warm and dry.    .[facility] reading (PRIMARY) by Coralie Keens, FNP-C, no acute process noted.       Assessment & Plan:  1. Need for shingles vaccine -information sheet provided about vaccination  2. Left-sided chest wall pain  - DG Chest 2 View; Future RTO if symptoms worsen or unresolved -Patient verbalized  understanding -Coralie Keens, FNP-C

## 2012-11-28 NOTE — Patient Instructions (Signed)
Shingles Vaccine  What You Need to Know  WHAT IS SHINGLES?  · Shingles is a painful skin rash, often with blisters. It is also called Herpes Zoster or just Zoster.  · A shingles rash usually appears on one side of the face or body and lasts from 2 to 4 weeks. Its main symptom is pain, which can be quite severe. Other symptoms of shingles can include fever, headache, chills, and upset stomach. Very rarely, a shingles infection can lead to pneumonia, hearing problems, blindness, brain inflammation (encephalitis), or death.  · For about 1 person in 5, severe pain can continue even after the rash clears up. This is called post-herpetic neuralgia.  · Shingles is caused by the Varicella Zoster virus. This is the same virus that causes chickenpox. Only someone who has had a case of chickenpox or rarely, has gotten chickenpox vaccine, can get shingles. The virus stays in your body. It can reappear many years later to cause a case of shingles.  · You cannot catch shingles from another person with shingles. However, a person who has never had chickenpox (or chickenpox vaccine) could get chickenpox from someone with shingles. This is not very common.  · Shingles is far more common in people 50 and older than in younger people. It is also more common in people whose immune systems are weakened because of a disease such as cancer or drugs such as steroids or chemotherapy.  · At least 1 million people get shingles per year in the [place].  SHINGLES VACCINE  · A vaccine for shingles was licensed in 2006. In clinical trials, the vaccine reduced the risk of shingles by 50%. It can also reduce the pain in people who still get shingles after being vaccinated.  · A single dose of shingles vaccine is recommended for adults 60 years of age and older.  SOME PEOPLE SHOULD NOT GET SHINGLES VACCINE OR SHOULD WAIT  A person should not get shingles vaccine if he or she:  · Has ever had a life-threatening allergic reaction to gelatin, the  antibiotic neomycin, or any other component of shingles vaccine. Tell your caregiver if you have any severe allergies.  · Has a weakened immune system because of current:  · AIDS or another disease that affects the immune system.  · Treatment with drugs that affect the immune system, such as prolonged use of high-dose steroids.  · Cancer treatment, such as radiation or chemotherapy.  · Cancer affecting the bone marrow or lymphatic system, such as leukemia or lymphoma.  · Is pregnant, or might be pregnant. Women should not become pregnant until at least 4 weeks after getting shingles vaccine.  Someone with a minor illness, such as a cold, may be vaccinated. Anyone with a moderate or severe acute illness should usually wait until he or she recovers before getting the vaccine. This includes anyone with a temperature of 101.3° F (38° C) or higher.  WHAT ARE THE RISKS FROM SHINGLES VACCINE?  · A vaccine, like any medicine, could possibly cause serious problems, such as severe allergic reactions. However, the risk of a vaccine causing serious harm, or death, is extremely small.  · No serious problems have been identified with shingles vaccine.  Mild Problems  · Redness, soreness, swelling, or itching at the site of the injection (about 1 person in 3).  · Headache (about 1 person in 70).  Like all vaccines, shingles vaccine is being closely monitored for unusual or severe problems.  WHAT IF   THERE IS A MODERATE OR SEVERE REACTION?  What should I look for?  Any unusual condition, such as a severe allergic reaction or a high fever. If a severe allergic reaction occurred, it would be within a few minutes to an hour after the shot. Signs of a serious allergic reaction can include difficulty breathing, weakness, hoarseness or wheezing, a fast heartbeat, hives, dizziness, paleness, or swelling of the throat.  What should I do?  · Call your caregiver, or get the person to a caregiver right away.  · Tell the caregiver what  happened, the date and time it happened, and when the vaccination was given.  · Ask the caregiver to report the reaction by filing a Vaccine Adverse Event Reporting System (VAERS) form. Or, you can file this report through the VAERS web site at www.vaers.hhs.gov or by calling 1-800-822-7967.  VAERS does not provide medical advice.  HOW CAN I LEARN MORE?  · Ask your caregiver. He or she can give you the vaccine package insert or suggest other sources of information.  · Contact the Centers for Disease Control and Prevention (CDC):  · Call 1-800-232-4636 (1-800-CDC-INFO).  · Visit the CDC website at www.cdc.gov/vaccines  CDC Shingles Vaccine VIS (10/23/07)  Document Released: 10/31/2005 Document Revised: 03/28/2011 Document Reviewed: 04/25/2012  ExitCare® Patient Information ©2014 [employer].

## 2013-11-12 ENCOUNTER — Other Ambulatory Visit: Payer: Self-pay | Admitting: Physician Assistant

## 2013-11-12 DIAGNOSIS — Z1231 Encounter for screening mammogram for malignant neoplasm of breast: Secondary | ICD-10-CM

## 2013-11-18 ENCOUNTER — Encounter: Payer: Self-pay | Admitting: General Practice

## 2013-11-21 ENCOUNTER — Ambulatory Visit (HOSPITAL_COMMUNITY)
Admission: RE | Admit: 2013-11-21 | Discharge: 2013-11-21 | Disposition: A | Discharge status: Home / Self Care | Payer: BC Managed Care – PPO | Source: Ambulatory Visit | Attending: Physician Assistant | Admitting: Physician Assistant

## 2013-11-21 DIAGNOSIS — Z1231 Encounter for screening mammogram for malignant neoplasm of breast: Secondary | ICD-10-CM | POA: Insufficient documentation

## 2014-02-17 ENCOUNTER — Telehealth: Payer: Self-pay | Admitting: Family

## 2014-02-17 NOTE — Telephone Encounter (Signed)
Pt given appt with Christy 2/5 @ 9:30.

## 2014-02-21 ENCOUNTER — Encounter: Payer: Self-pay | Admitting: Family

## 2014-02-21 ENCOUNTER — Ambulatory Visit (INDEPENDENT_AMBULATORY_CARE_PROVIDER_SITE_OTHER): Payer: 59 | Admitting: Family

## 2014-02-21 VITALS — BP 142/101 | HR 79 | Temp 97.6°F | Ht 64.0 in | Wt 178.2 lb

## 2014-02-21 DIAGNOSIS — I10 Essential (primary) hypertension: Secondary | ICD-10-CM

## 2014-02-21 DIAGNOSIS — E785 Hyperlipidemia, unspecified: Secondary | ICD-10-CM | POA: Insufficient documentation

## 2014-02-21 DIAGNOSIS — Z1321 Encounter for screening for nutritional disorder: Secondary | ICD-10-CM

## 2014-02-21 DIAGNOSIS — Z1211 Encounter for screening for malignant neoplasm of colon: Secondary | ICD-10-CM

## 2014-02-21 DIAGNOSIS — K219 Gastro-esophageal reflux disease without esophagitis: Secondary | ICD-10-CM

## 2014-02-21 MED ORDER — PRAVASTATIN SODIUM 20 MG PO TABS: 20.0000 mg | ORAL_TABLET | Freq: Every day | ORAL | Status: DC

## 2014-02-21 MED ORDER — OMEPRAZOLE 20 MG PO CPDR: 20.0000 mg | DELAYED_RELEASE_CAPSULE | Freq: Every day | ORAL | Status: DC

## 2014-02-21 MED ORDER — HYDROCHLOROTHIAZIDE 25 MG PO TABS: 25.0000 mg | ORAL_TABLET | Freq: Every day | ORAL | Status: DC

## 2014-02-21 MED ORDER — METOPROLOL SUCCINATE ER 50 MG PO TB24: 50.0000 mg | ORAL_TABLET | Freq: Every day | ORAL | Status: DC

## 2014-02-21 NOTE — Patient Instructions (Signed)

## 2014-02-21 NOTE — Progress Notes (Signed)
Subjective:    Patient ID: [name], female    DOB: February 06, 1950, 64 y.o.   MRN: [id number]  Hypertension This is a chronic problem. The current episode started more than 1 year ago. The problem has been waxing and waning ("Pt has not taken meds the last 4 weeks) since onset. The problem is uncontrolled. Associated symptoms include peripheral edema ("at times"). Pertinent negatives include no anxiety, blurred vision, headaches, palpitations or shortness of breath. Risk factors for coronary artery disease include dyslipidemia, obesity, sedentary lifestyle and family history. Past treatments include diuretics and beta blockers. There is no history of kidney disease, CAD/MI, CVA, heart failure or a thyroid problem. There is no history of sleep apnea.  Hyperlipidemia This is a chronic problem. The current episode started more than 1 year ago. The problem is controlled. Recent lipid tests were reviewed and are normal. She has no history of diabetes or hypothyroidism. Pertinent negatives include no leg pain, myalgias or shortness of breath. Current antihyperlipidemic treatment includes statins. The current treatment provides significant improvement of lipids. Risk factors for coronary artery disease include dyslipidemia, diabetes mellitus, hypertension, family history and a sedentary lifestyle.  Gastrophageal Reflux She reports no belching, no coughing, no heartburn, no sore throat or no wheezing. This is a chronic problem. The current episode started more than 1 year ago. The problem occurs rarely. The problem has been resolved. The symptoms are aggravated by certain foods. Pertinent negatives include no fatigue. She has tried a PPI for the symptoms. The treatment provided significant relief.      Review of Systems  Constitutional: Negative.  Negative for fatigue.  HENT: Negative.  Negative for sore throat.   Eyes: Negative.  Negative for blurred vision.  Respiratory: Negative.  Negative for  cough, shortness of breath and wheezing.   Cardiovascular: Negative.  Negative for palpitations.  Gastrointestinal: Negative.  Negative for heartburn.  Endocrine: Negative.   Genitourinary: Negative.   Musculoskeletal: Negative.  Negative for myalgias.  Neurological: Negative.  Negative for headaches.  Hematological: Negative.   Psychiatric/Behavioral: Negative.   All other systems reviewed and are negative.      Objective:   Physical Exam  Constitutional: She is oriented to person, place, and time. She appears well-developed and well-nourished. No distress.  HENT:  Head: Normocephalic and atraumatic.  Right Ear: External ear normal.  Left Ear: External ear normal.  Nose: Nose normal.  Mouth/Throat: Oropharynx is clear and moist.  Eyes: Pupils are equal, round, and reactive to light.  Neck: Normal range of motion. Neck supple. No thyromegaly present.  Cardiovascular: Normal rate, regular rhythm, normal heart sounds and intact distal pulses.   No murmur heard. Pulmonary/Chest: Effort normal and breath sounds normal. No respiratory distress. She has no wheezes.  Abdominal: Soft. Bowel sounds are normal. She exhibits no distension. There is no tenderness.  Musculoskeletal: Normal range of motion. She exhibits no edema or tenderness.  Neurological: She is alert and oriented to person, place, and time. She has normal reflexes. No cranial nerve deficit.  Skin: Skin is warm and dry.  Psychiatric: She has a normal mood and affect. Her behavior is normal. Judgment and thought content normal.  Vitals reviewed.    BP 142/101 mmHg  Pulse 79  Temp(Src) 97.6 F (36.4 C) (Oral)  Ht 5' 4" (1.626 m)  Wt 178 lb 3.2 oz (80.831 kg)  BMI 30.57 kg/m2      Assessment & Plan:  1. Essential hypertension -Pt has not taken any BP  meds for the last 4-5 weeks-RX sent to pharamcy and pt to RTO in 2 weeks to recheck BP  CMP14+EGFR - hydrochlorothiazide (HYDRODIURIL) 25 MG tablet; Take 1 tablet (25  mg total) by mouth daily.  Dispense: 90 tablet; Refill: 4 - metoprolol succinate (TOPROL-XL) 50 MG 24 hr tablet; Take 1 tablet (50 mg total) by mouth daily.  Dispense: 90 tablet; Refill: 4  2. Hyperlipidemia - CMP14+EGFR - Lipid panel - pravastatin (PRAVACHOL) 20 MG tablet; Take 1 tablet (20 mg total) by mouth daily.  Dispense: 90 tablet; Refill: 4  3. Gastroesophageal reflux disease, esophagitis presence not specified - CMP14+EGFR - omeprazole (PRILOSEC) 20 MG capsule; Take 1 capsule (20 mg total) by mouth daily. Over the counter.  Dispense: 90 capsule; Refill: 4  4. Encounter for vitamin deficiency screening - Vit D  25 hydroxy (rtn osteoporosis monitoring)   Continue all meds Labs pending Health Maintenance reviewed- Pt to call about TDAP Diet and exercise encouraged RTO 2 weeks for blood pressure recheck  Evelina Dun, FNP

## 2014-02-22 LAB — CMP14+EGFR
ALK PHOS: 62 IU/L (ref 39–117)
ALT: 34 IU/L — AB (ref 0–32)
AST: 22 IU/L (ref 0–40)
Albumin/Globulin Ratio: 2.2 (ref 1.1–2.5)
Albumin: 4.4 g/dL (ref 3.6–4.8)
BUN / CREAT RATIO: 13 (ref 11–26)
BUN: 11 mg/dL (ref 8–27)
CO2: 24 mmol/L (ref 18–29)
Calcium: 9.9 mg/dL (ref 8.7–10.3)
Chloride: 101 mmol/L (ref 97–108)
Creatinine, Ser: 0.86 mg/dL (ref 0.57–1.00)
GFR calc Af Amer: 84 mL/min/{1.73_m2} (ref >59)
GFR, EST NON AFRICAN AMERICAN: 73 mL/min/{1.73_m2} (ref >59)
GLOBULIN, TOTAL: 2 g/dL (ref 1.5–4.5)
Glucose: 107 mg/dL — ABNORMAL HIGH (ref 65–99)
POTASSIUM: 4 mmol/L (ref 3.5–5.2)
SODIUM: 141 mmol/L (ref 134–144)
Total Bilirubin: 0.2 mg/dL (ref 0.0–1.2)
Total Protein: 6.4 g/dL (ref 6.0–8.5)

## 2014-02-22 LAB — VITAMIN D 25 HYDROXY (VIT D DEFICIENCY, FRACTURES): VIT D 25 HYDROXY: 13 ng/mL — AB (ref 30.0–100.0)

## 2014-02-22 LAB — LIPID PANEL
CHOLESTEROL TOTAL: 226 mg/dL — AB (ref 100–199)
Chol/HDL Ratio: 4.6 ratio units — ABNORMAL HIGH (ref 0.0–4.4)
HDL: 49 mg/dL (ref >39)
LDL Calculated: 141 mg/dL — ABNORMAL HIGH (ref 0–99)
Triglycerides: 180 mg/dL — ABNORMAL HIGH (ref 0–149)
VLDL CHOLESTEROL CAL: 36 mg/dL (ref 5–40)

## 2014-02-24 ENCOUNTER — Other Ambulatory Visit: Payer: Self-pay | Admitting: Family

## 2014-02-24 DIAGNOSIS — E559 Vitamin D deficiency, unspecified: Secondary | ICD-10-CM | POA: Insufficient documentation

## 2014-02-24 MED ORDER — ATORVASTATIN CALCIUM 40 MG PO TABS: 40.0000 mg | ORAL_TABLET | Freq: Every day | ORAL | Status: DC

## 2014-02-24 MED ORDER — VITAMIN D (ERGOCALCIFEROL) 1.25 MG (50000 UNIT) PO CAPS: 50000.0000 [IU] | ORAL_CAPSULE | ORAL | Status: DC

## 2014-02-25 ENCOUNTER — Telehealth: Payer: Self-pay | Admitting: Family

## 2014-02-25 NOTE — Telephone Encounter (Signed)
Spoke with patient.

## 2014-03-03 ENCOUNTER — Encounter: Payer: Self-pay | Admitting: Internal Medicine

## 2014-03-10 ENCOUNTER — Encounter: Payer: Self-pay | Admitting: Family

## 2014-03-10 ENCOUNTER — Ambulatory Visit (INDEPENDENT_AMBULATORY_CARE_PROVIDER_SITE_OTHER): Payer: 59 | Admitting: Family

## 2014-03-10 VITALS — BP 124/71 | HR 83 | Temp 98.1°F | Ht 64.0 in | Wt 182.0 lb

## 2014-03-10 DIAGNOSIS — Z111 Encounter for screening for respiratory tuberculosis: Secondary | ICD-10-CM

## 2014-03-10 DIAGNOSIS — Z23 Encounter for immunization: Secondary | ICD-10-CM

## 2014-03-10 DIAGNOSIS — I10 Essential (primary) hypertension: Secondary | ICD-10-CM

## 2014-03-10 NOTE — Progress Notes (Signed)
   Subjective:    Patient ID: [name], female    DOB: 10/19/1950, 64 y.o.   MRN: [id number]  Pt presents to the office for follow up on hypertension. Pt's BP is at goal today! Hypertension This is a chronic problem. The current episode started more than 1 year ago. The problem has been resolved since onset. The problem is controlled. Associated symptoms include peripheral edema ("some"). Pertinent negatives include no anxiety, headaches, palpitations or shortness of breath. Risk factors for coronary artery disease include dyslipidemia, post-menopausal state, sedentary lifestyle and family history. Past treatments include beta blockers and diuretics. The current treatment provides moderate improvement. There is no history of kidney disease, CAD/MI, CVA, heart failure or a thyroid problem. There is no history of sleep apnea.      Review of Systems  Constitutional: Negative.   HENT: Negative.   Eyes: Negative.   Respiratory: Negative.  Negative for shortness of breath.   Cardiovascular: Negative.  Negative for palpitations.  Gastrointestinal: Negative.   Endocrine: Negative.   Genitourinary: Negative.   Musculoskeletal: Negative.   Neurological: Negative.  Negative for headaches.  Hematological: Negative.   Psychiatric/Behavioral: Negative.   All other systems reviewed and are negative.      Objective:   Physical Exam  Constitutional: She is oriented to person, place, and time. She appears well-developed and well-nourished. No distress.  HENT:  Head: Normocephalic and atraumatic.  Right Ear: External ear normal.  Mouth/Throat: Oropharynx is clear and moist.  Eyes: Pupils are equal, round, and reactive to light.  Neck: Normal range of motion. Neck supple. No thyromegaly present.  Cardiovascular: Normal rate, regular rhythm, normal heart sounds and intact distal pulses.   No murmur heard. Pulmonary/Chest: Effort normal and breath sounds normal. No respiratory distress. She  has no wheezes.  Abdominal: Soft. Bowel sounds are normal. She exhibits no distension. There is no tenderness.  Musculoskeletal: Normal range of motion. She exhibits no edema or tenderness.  Neurological: She is alert and oriented to person, place, and time. She has normal reflexes. No cranial nerve deficit.  Skin: Skin is warm and dry.  Psychiatric: She has a normal mood and affect. Her behavior is normal. Judgment and thought content normal.  Vitals reviewed.   BP 124/71 mmHg  Pulse 83  Temp(Src) 98.1 F (36.7 C)  Ht 5\' 4"  (1.626 m)  Wt 182 lb (82.555 kg)  BMI 31.22 kg/m2       Assessment & Plan:  1. Essential hypertension -Daily blood pressure log given with instructions on how to fill out and told to bring to next visit -Dash diet information given -Exercise encouraged - Stress Management  -Continue current meds     Continue all meds Health Maintenance reviewed-TDAP given today Diet and exercise encouraged RTO 6 months  Evelina Dun, FNP

## 2014-03-10 NOTE — Addendum Note (Signed)
Addended by: Jamelle Haring on: 03/10/2014 03:54 PM   Modules accepted: Orders, SmartSet

## 2014-03-10 NOTE — Patient Instructions (Signed)

## 2014-03-12 ENCOUNTER — Encounter: Payer: Self-pay | Admitting: *Deleted

## 2014-03-12 LAB — TB SKIN TEST
INDURATION: 0 mm
TB SKIN TEST: NEGATIVE

## 2014-04-16 ENCOUNTER — Ambulatory Visit (AMBULATORY_SURGERY_CENTER): Payer: Self-pay | Admitting: *Deleted

## 2014-04-16 VITALS — Ht 64.0 in | Wt 188.0 lb

## 2014-04-16 DIAGNOSIS — Z1211 Encounter for screening for malignant neoplasm of colon: Secondary | ICD-10-CM

## 2014-04-16 MED ORDER — MOVIPREP 100 G PO SOLR: ORAL | Status: DC

## 2014-04-16 NOTE — Progress Notes (Signed)
Patient denies any allergies to eggs or soy. Patient denies any problems with anesthesia/sedation. Patient denies any oxygen use at home and does not take any diet/weight loss medications. Patient declined EMMI information.

## 2014-04-28 ENCOUNTER — Encounter: Payer: Self-pay | Admitting: Internal Medicine

## 2014-04-28 ENCOUNTER — Ambulatory Visit (AMBULATORY_SURGERY_CENTER): Payer: 59 | Admitting: Internal Medicine

## 2014-04-28 VITALS — BP 127/79 | HR 69 | Temp 96.8°F | Resp 15 | Ht 64.0 in | Wt 188.0 lb

## 2014-04-28 DIAGNOSIS — Z1211 Encounter for screening for malignant neoplasm of colon: Secondary | ICD-10-CM | POA: Diagnosis present

## 2014-04-28 MED ORDER — SODIUM CHLORIDE 0.9 % IV SOLN: 500.0000 mL | INTRAVENOUS | Status: DC

## 2014-04-28 NOTE — Op Note (Signed)
[facility]  [address] [place], [id number]   COLONOSCOPY PROCEDURE REPORT  PATIENT: [name], [name]  MR#: [id number] BIRTHDATE: 1950-04-23 , 64  yrs. old GENDER: female ENDOSCOPIST: Eustace Quail, MD REFERRED HQ:[id number] [place], [place]:  04/28/2014 PROCEDURE:   Colonoscopy, screening First Screening Colonoscopy - Avg.  risk and is 50 yrs.  old or older - No.  Prior Negative Screening - Now for repeat screening. 10 or more years since last screening  History of Adenoma - Now for follow-up colonoscopy & has been > or = to 3 yrs.  N/A ASA CLASS:   Class II INDICATIONS:Screening for colonic neoplasia and Colorectal Neoplasm Risk Assessment for this procedure is average risk1. Colonoscopy with Dr. Velora Heckler 2001 negative for neoplasia. Patient reports colonoscopy elsewhere 10 years ago (results unknown). MEDICATIONS: Monitored anesthesia care and Propofol 250 mg IV  DESCRIPTION OF PROCEDURE:   After the risks benefits and alternatives of the procedure were thoroughly explained, informed consent was obtained.  The digital rectal exam revealed no abnormalities of the rectum.   The [id number]  endoscope was introduced through the anus and advanced to the cecum, which was identified by both the appendix and ileocecal valve. No adverse events experienced.   The quality of the prep was good.  (MoviPrep was used)  The instrument was then slowly withdrawn as the colon was fully examined.    COLON FINDINGS: There was moderate diverticulosis noted in the left colon.   The examination was otherwise normal.  Retroflexed views revealed internal hemorrhoids. The time to cecum = 4.3 Withdrawal time = 11.7   The scope was withdrawn and the procedure completed. COMPLICATIONS: There were no immediate complications.  ENDOSCOPIC IMPRESSION: 1.   Moderate diverticulosis was noted in the left colon 2.   The examination was otherwise  normal  RECOMMENDATIONS: 1. Continue current colorectal screening recommendations for "routine risk" patients with a repeat colonoscopy in 10 years.  eSigned:  Eustace Quail, MD 04/28/2014 10:48 AM   cc: The Patient and Evelina Dun, FNP

## 2014-04-28 NOTE — Patient Instructions (Signed)
YOU HAD AN ENDOSCOPIC PROCEDURE TODAY AT THE [facility]:   Refer to the procedure report that was given to you for any specific questions about what was found during the examination.  If the procedure report does not answer your questions, please call your gastroenterologist to clarify.  If you requested that your care partner not be given the details of your procedure findings, then the procedure report has been included in a sealed envelope for you to review at your convenience later.  YOU SHOULD EXPECT: Some feelings of bloating in the abdomen. Passage of more gas than usual.  Walking can help get rid of the air that was put into your GI tract during the procedure and reduce the bloating. If you had a lower endoscopy (such as a colonoscopy or flexible sigmoidoscopy) you may notice spotting of blood in your stool or on the toilet paper. If you underwent a bowel prep for your procedure, you may not have a normal bowel movement for a few days.  Please Note:  You might notice some irritation and congestion in your nose or some drainage.  This is from the oxygen used during your procedure.  There is no need for concern and it should clear up in a day or so.  SYMPTOMS TO REPORT IMMEDIATELY:   Following lower endoscopy (colonoscopy or flexible sigmoidoscopy):  Excessive amounts of blood in the stool  Significant tenderness or worsening of abdominal pains  Swelling of the abdomen that is new, acute  Fever of 100F or higher   For urgent or emergent issues, a gastroenterologist can be reached at any hour by calling [phone].   DIET: Your first meal following the procedure should be a small meal and then it is ok to progress to your normal diet. Heavy or fried foods are harder to digest and may make you feel nauseous or bloated.  Likewise, meals heavy in dairy and vegetables can increase bloating.  Drink plenty of fluids but you should avoid alcoholic beverages for 24  hours.  ACTIVITY:  You should plan to take it easy for the rest of today and you should NOT DRIVE or use heavy machinery until tomorrow (because of the sedation medicines used during the test).    FOLLOW UP: Our staff will call the number listed on your records the next business day following your procedure to check on you and address any questions or concerns that you may have regarding the information given to you following your procedure. If we do not reach you, we will leave a message.  However, if you are feeling well and you are not experiencing any problems, there is no need to return our call.  We will assume that you have returned to your regular daily activities without incident.  If any biopsies were taken you will be contacted by phone or by letter within the next 1-3 weeks.  Please call us at [phone] if you have not heard about the biopsies in 3 weeks.    SIGNATURES/CONFIDENTIALITY: You and/or your care partner have signed paperwork which will be entered into your electronic medical record.  These signatures attest to the fact that that the information above on your After Visit Summary has been reviewed and is understood.  Full responsibility of the confidentiality of this discharge information lies with you and/or your care-partner. 

## 2014-04-28 NOTE — Progress Notes (Signed)
To recovery, report to [place], RN, VSS 

## 2014-04-29 ENCOUNTER — Telehealth: Payer: Self-pay

## 2014-04-29 NOTE — Telephone Encounter (Signed)
  Follow up Call-  Call back number 04/28/2014  Post procedure Call Back phone  # [phone]  Permission to leave phone message Yes     Patient questions:  Do you have a fever, pain , or abdominal swelling? No. Pain Score  0 *  Have you tolerated food without any problems? Yes.    Have you been able to return to your normal activities? Yes.    Do you have any questions about your discharge instructions: Diet   No. Medications  No. Follow up visit  No.  Do you have questions or concerns about your Care? No.  Actions: * If pain score is 4 or above: No action needed, pain <4.

## 2014-05-13 ENCOUNTER — Telehealth: Payer: Self-pay | Admitting: Family

## 2014-05-13 DIAGNOSIS — M79673 Pain in unspecified foot: Secondary | ICD-10-CM

## 2014-05-13 NOTE — Telephone Encounter (Signed)
Referral sent per pt's request

## 2014-05-26 ENCOUNTER — Telehealth: Payer: Self-pay | Admitting: Family

## 2014-06-09 ENCOUNTER — Ambulatory Visit (INDEPENDENT_AMBULATORY_CARE_PROVIDER_SITE_OTHER): Payer: 59 | Admitting: Family

## 2014-06-09 ENCOUNTER — Encounter: Payer: Self-pay | Admitting: Family

## 2014-06-09 ENCOUNTER — Ambulatory Visit: Payer: 59 | Admitting: Family

## 2014-06-09 VITALS — BP 135/75 | HR 79 | Temp 96.5°F | Ht 64.0 in | Wt 188.4 lb

## 2014-06-09 DIAGNOSIS — Z01419 Encounter for gynecological examination (general) (routine) without abnormal findings: Secondary | ICD-10-CM | POA: Diagnosis not present

## 2014-06-09 DIAGNOSIS — R5383 Other fatigue: Secondary | ICD-10-CM

## 2014-06-09 DIAGNOSIS — E559 Vitamin D deficiency, unspecified: Secondary | ICD-10-CM

## 2014-06-09 DIAGNOSIS — K219 Gastro-esophageal reflux disease without esophagitis: Secondary | ICD-10-CM

## 2014-06-09 DIAGNOSIS — E785 Hyperlipidemia, unspecified: Secondary | ICD-10-CM

## 2014-06-09 DIAGNOSIS — I10 Essential (primary) hypertension: Secondary | ICD-10-CM

## 2014-06-09 DIAGNOSIS — Z0000 Encounter for general adult medical examination without abnormal findings: Secondary | ICD-10-CM

## 2014-06-09 LAB — POCT UA - MICROSCOPIC ONLY
Bacteria, U Microscopic: NEGATIVE
Casts, Ur, LPF, POC: NEGATIVE
Crystals, Ur, HPF, POC: NEGATIVE
MUCUS UA: NEGATIVE
RBC, urine, microscopic: NEGATIVE
Yeast, UA: NEGATIVE

## 2014-06-09 LAB — POCT URINALYSIS DIPSTICK
BILIRUBIN UA: NEGATIVE
Glucose, UA: NEGATIVE
Ketones, UA: NEGATIVE
Nitrite, UA: NEGATIVE
PH UA: 7
PROTEIN UA: NEGATIVE
RBC UA: NEGATIVE
SPEC GRAV UA: 1.01
Urobilinogen, UA: NEGATIVE

## 2014-06-09 NOTE — Progress Notes (Signed)
Subjective:    Patient ID: [name], female    DOB: 1950-09-30, 64 y.o.   MRN: [id number]  Pt presents to the office today CPE with pap.  Gynecologic Exam Pertinent negatives include no headaches or sore throat.  Hypertension This is a chronic problem. The current episode started more than 1 year ago. The problem has been waxing and waning ("Pt has not taken meds the last 4 weeks) since onset. The problem is uncontrolled. Associated symptoms include peripheral edema ("at times"). Pertinent negatives include no anxiety, blurred vision, headaches, palpitations or shortness of breath. Risk factors for coronary artery disease include dyslipidemia, obesity, sedentary lifestyle and family history. Past treatments include diuretics and beta blockers. There is no history of kidney disease, CAD/MI, CVA, heart failure or a thyroid problem. There is no history of sleep apnea.  Hyperlipidemia This is a chronic problem. The current episode started more than 1 year ago. The problem is controlled. Recent lipid tests were reviewed and are normal. She has no history of diabetes or hypothyroidism. Pertinent negatives include no leg pain, myalgias or shortness of breath. Current antihyperlipidemic treatment includes statins. The current treatment provides significant improvement of lipids. Risk factors for coronary artery disease include dyslipidemia, diabetes mellitus, hypertension, family history and a sedentary lifestyle.  Gastrophageal Reflux She reports no belching, no coughing, no heartburn, no sore throat or no wheezing. This is a chronic problem. The current episode started more than 1 year ago. The problem occurs rarely. The problem has been resolved. The symptoms are aggravated by certain foods. Associated symptoms include fatigue. She has tried a PPI for the symptoms. The treatment provided significant relief.      Review of Systems  Constitutional: Positive for fatigue.  HENT: Negative.   Negative for sore throat.   Eyes: Negative.  Negative for blurred vision.  Respiratory: Negative.  Negative for cough, shortness of breath and wheezing.   Cardiovascular: Negative.  Negative for palpitations.  Gastrointestinal: Negative.  Negative for heartburn.  Endocrine: Negative.   Genitourinary: Negative.   Musculoskeletal: Negative.  Negative for myalgias.  Neurological: Negative.  Negative for headaches.  Hematological: Negative.   Psychiatric/Behavioral: Negative.   All other systems reviewed and are negative.      Objective:   Physical Exam  Constitutional: She is oriented to person, place, and time. She appears well-developed and well-nourished. No distress.  HENT:  Head: Normocephalic and atraumatic.  Right Ear: External ear normal.  Left Ear: External ear normal.  Nose: Nose normal.  Mouth/Throat: Oropharynx is clear and moist.  Eyes: Pupils are equal, round, and reactive to light.  Neck: Normal range of motion. Neck supple. No thyromegaly present.  Cardiovascular: Normal rate, regular rhythm, normal heart sounds and intact distal pulses.   No murmur heard. Pulmonary/Chest: Effort normal and breath sounds normal. No respiratory distress. She has no wheezes. Right breast exhibits no inverted nipple, no mass, no nipple discharge, no skin change and no tenderness. Left breast exhibits no inverted nipple, no mass, no nipple discharge, no skin change and no tenderness. Breasts are symmetrical.  Abdominal: Soft. Bowel sounds are normal. She exhibits no distension. There is no tenderness.  Genitourinary: Vagina normal.  Bimanual exam- no adnexal masses or tenderness, ovaries nonpalpable   Cervix parous and pink- No discharge   Musculoskeletal: Normal range of motion. She exhibits no edema or tenderness.  Neurological: She is alert and oriented to person, place, and time. She has normal reflexes. No cranial nerve deficit.  Skin:  Skin is warm and dry.  Psychiatric: She has a  normal mood and affect. Her behavior is normal. Judgment and thought content normal.  Vitals reviewed.    BP 135/75 mmHg  Pulse 79  Temp(Src) 96.5 F (35.8 C) (Oral)  Ht _0  (1.626 m)  Wt 188 lb 6.4 oz (85.458 kg)  BMI 32.32 kg/m2      Assessment & Plan:  1. Encounter for routine gynecological examination - POCT UA - Microscopic Only - POCT urinalysis dipstick - CMP14+EGFR - Pap IG w/ reflex to HPV when ASC-U  2. Essential hypertension - CMP14+EGFR  3. Gastroesophageal reflux disease, esophagitis presence not specified - CMP14+EGFR  4. Hyperlipidemia - CMP14+EGFR - Lipid panel  5. Vitamin D deficiency - CMP14+EGFR - Vit D  25 hydroxy (rtn osteoporosis monitoring)  6. Annual physical exam - CMP14+EGFR - Lipid panel - Thyroid Panel With TSH - Vit D  25 hydroxy (rtn osteoporosis monitoring) - Anemia Profile B - Pap IG w/ reflex to HPV when ASC-U  7. Other fatigue - CMP14+EGFR - Thyroid Panel With TSH - Anemia Profile B   Continue all meds Labs pending Health Maintenance reviewed Diet and exercise encouraged RTO 6 months  Evelina Dun, FNP

## 2014-06-09 NOTE — Patient Instructions (Signed)

## 2014-06-10 LAB — ANEMIA PROFILE B
Basophils Absolute: 0 10*3/uL (ref 0.0–0.2)
Basos: 0 %
EOS (ABSOLUTE): 0.1 10*3/uL (ref 0.0–0.4)
EOS: 1 %
FOLATE: 11.1 ng/mL (ref >3.0)
Ferritin: 40 ng/mL (ref 15–150)
HEMATOCRIT: 40.2 % (ref 34.0–46.6)
HEMOGLOBIN: 13.7 g/dL (ref 11.1–15.9)
Immature Grans (Abs): 0 10*3/uL (ref 0.0–0.1)
Immature Granulocytes: 0 %
Iron Saturation: 30 % (ref 15–55)
Iron: 118 ug/dL (ref 27–139)
LYMPHS: 45 %
Lymphocytes Absolute: 3.7 10*3/uL — ABNORMAL HIGH (ref 0.7–3.1)
MCH: 29.8 pg (ref 26.6–33.0)
MCHC: 34.1 g/dL (ref 31.5–35.7)
MCV: 88 fL (ref 79–97)
MONOCYTES: 6 %
Monocytes Absolute: 0.5 10*3/uL (ref 0.1–0.9)
NEUTROS PCT: 48 %
Neutrophils Absolute: 3.8 10*3/uL (ref 1.4–7.0)
PLATELETS: 385 10*3/uL — AB (ref 150–379)
RBC: 4.59 x10E6/uL (ref 3.77–5.28)
RDW: 14.1 % (ref 12.3–15.4)
Retic Ct Pct: 1.3 % (ref 0.6–2.6)
TIBC: 391 ug/dL (ref 250–450)
UIBC: 273 ug/dL (ref 118–369)
Vitamin B-12: 398 pg/mL (ref 211–946)
WBC: 8.1 10*3/uL (ref 3.4–10.8)

## 2014-06-10 LAB — LIPID PANEL
Chol/HDL Ratio: 3 ratio units (ref 0.0–4.4)
Cholesterol, Total: 188 mg/dL (ref 100–199)
HDL: 62 mg/dL (ref >39)
LDL Calculated: 91 mg/dL (ref 0–99)
Triglycerides: 174 mg/dL — ABNORMAL HIGH (ref 0–149)
VLDL Cholesterol Cal: 35 mg/dL (ref 5–40)

## 2014-06-10 LAB — CMP14+EGFR
A/G RATIO: 2.2 (ref 1.1–2.5)
ALT: 29 IU/L (ref 0–32)
AST: 22 IU/L (ref 0–40)
Albumin: 4.6 g/dL (ref 3.6–4.8)
Alkaline Phosphatase: 62 IU/L (ref 39–117)
BUN/Creatinine Ratio: 13 (ref 11–26)
BUN: 11 mg/dL (ref 8–27)
Bilirubin Total: 0.4 mg/dL (ref 0.0–1.2)
CHLORIDE: 98 mmol/L (ref 97–108)
CO2: 26 mmol/L (ref 18–29)
CREATININE: 0.87 mg/dL (ref 0.57–1.00)
Calcium: 10.1 mg/dL (ref 8.7–10.3)
GFR calc Af Amer: 82 mL/min/{1.73_m2} (ref >59)
GFR, EST NON AFRICAN AMERICAN: 71 mL/min/{1.73_m2} (ref >59)
Globulin, Total: 2.1 g/dL (ref 1.5–4.5)
Glucose: 118 mg/dL — ABNORMAL HIGH (ref 65–99)
POTASSIUM: 3.9 mmol/L (ref 3.5–5.2)
SODIUM: 143 mmol/L (ref 134–144)
Total Protein: 6.7 g/dL (ref 6.0–8.5)

## 2014-06-10 LAB — THYROID PANEL WITH TSH
Free Thyroxine Index: 2.4 (ref 1.2–4.9)
T3 UPTAKE RATIO: 24 % (ref 24–39)
T4, Total: 10 ug/dL (ref 4.5–12.0)
TSH: 2.18 u[IU]/mL (ref 0.450–4.500)

## 2014-06-10 LAB — VITAMIN D 25 HYDROXY (VIT D DEFICIENCY, FRACTURES): Vit D, 25-Hydroxy: 35.7 ng/mL (ref 30.0–100.0)

## 2014-06-11 LAB — PAP IG W/ RFLX HPV ASCU: PAP SMEAR COMMENT: 0

## 2014-12-03 ENCOUNTER — Other Ambulatory Visit: Payer: Self-pay

## 2014-12-03 DIAGNOSIS — Z1231 Encounter for screening mammogram for malignant neoplasm of breast: Secondary | ICD-10-CM

## 2014-12-15 ENCOUNTER — Ambulatory Visit: Payer: 59 | Admitting: Family

## 2015-01-07 ENCOUNTER — Ambulatory Visit: Payer: 59

## 2015-01-09 ENCOUNTER — Other Ambulatory Visit: Payer: Self-pay

## 2015-01-09 DIAGNOSIS — Z1231 Encounter for screening mammogram for malignant neoplasm of breast: Secondary | ICD-10-CM

## 2015-01-15 ENCOUNTER — Ambulatory Visit (INDEPENDENT_AMBULATORY_CARE_PROVIDER_SITE_OTHER): Payer: 59 | Admitting: Family

## 2015-01-15 ENCOUNTER — Encounter: Payer: Self-pay | Admitting: Family

## 2015-01-15 ENCOUNTER — Ambulatory Visit: Payer: 59 | Admitting: Family

## 2015-01-15 VITALS — BP 133/74 | HR 78 | Temp 97.4°F | Ht 64.0 in | Wt 194.0 lb

## 2015-01-15 DIAGNOSIS — E559 Vitamin D deficiency, unspecified: Secondary | ICD-10-CM

## 2015-01-15 DIAGNOSIS — Z1159 Encounter for screening for other viral diseases: Secondary | ICD-10-CM | POA: Diagnosis not present

## 2015-01-15 DIAGNOSIS — K219 Gastro-esophageal reflux disease without esophagitis: Secondary | ICD-10-CM

## 2015-01-15 DIAGNOSIS — I10 Essential (primary) hypertension: Secondary | ICD-10-CM | POA: Diagnosis not present

## 2015-01-15 DIAGNOSIS — E785 Hyperlipidemia, unspecified: Secondary | ICD-10-CM | POA: Diagnosis not present

## 2015-01-15 DIAGNOSIS — Z23 Encounter for immunization: Secondary | ICD-10-CM | POA: Diagnosis not present

## 2015-01-15 MED ORDER — HYDROCHLOROTHIAZIDE 25 MG PO TABS: 25.0000 mg | ORAL_TABLET | Freq: Every day | ORAL | Status: DC

## 2015-01-15 MED ORDER — METOPROLOL SUCCINATE ER 50 MG PO TB24: 50.0000 mg | ORAL_TABLET | Freq: Every day | ORAL | Status: DC

## 2015-01-15 MED ORDER — GABAPENTIN 100 MG PO CAPS: 100.0000 mg | ORAL_CAPSULE | Freq: Two times a day (BID) | ORAL | Status: DC

## 2015-01-15 MED ORDER — VITAMIN D (ERGOCALCIFEROL) 1.25 MG (50000 UNIT) PO CAPS: 50000.0000 [IU] | ORAL_CAPSULE | ORAL | Status: DC

## 2015-01-15 MED ORDER — ATORVASTATIN CALCIUM 40 MG PO TABS: 40.0000 mg | ORAL_TABLET | Freq: Every day | ORAL | Status: DC

## 2015-01-15 MED ORDER — OMEPRAZOLE 20 MG PO CPDR: 20.0000 mg | DELAYED_RELEASE_CAPSULE | Freq: Every day | ORAL | Status: DC

## 2015-01-15 NOTE — Progress Notes (Signed)
Subjective:    Patient ID: [name], female    DOB: 1950-02-20, 64 y.o.   MRN: [id number]  Pt presents to the office today for chronic follow up.  Hypertension This is a chronic problem. The current episode started more than 1 year ago. The problem has been resolved since onset. The problem is controlled. Associated symptoms include peripheral edema ("at times"). Pertinent negatives include no anxiety, blurred vision, headaches, palpitations or shortness of breath. Risk factors for coronary artery disease include dyslipidemia, obesity, sedentary lifestyle and family history. Past treatments include diuretics and beta blockers. There is no history of kidney disease, CAD/MI, CVA, heart failure or a thyroid problem. There is no history of sleep apnea.  Hyperlipidemia This is a chronic problem. The current episode started more than 1 year ago. The problem is controlled. Recent lipid tests were reviewed and are normal. She has no history of diabetes or hypothyroidism. Pertinent negatives include no leg pain, myalgias or shortness of breath. Current antihyperlipidemic treatment includes statins. The current treatment provides significant improvement of lipids. Risk factors for coronary artery disease include dyslipidemia, diabetes mellitus, hypertension, family history and a sedentary lifestyle.  Gastroesophageal Reflux She reports no belching, no coughing, no heartburn or no wheezing. This is a chronic problem. The current episode started more than 1 year ago. The problem occurs rarely. The problem has been resolved. The symptoms are aggravated by certain foods. Associated symptoms include fatigue. She has tried a PPI for the symptoms. The treatment provided significant relief.      Review of Systems  Constitutional: Positive for fatigue.  HENT: Negative.   Eyes: Negative.  Negative for blurred vision.  Respiratory: Negative.  Negative for cough, shortness of breath and wheezing.     Cardiovascular: Negative.  Negative for palpitations.  Gastrointestinal: Negative.  Negative for heartburn.  Endocrine: Negative.   Genitourinary: Negative.   Musculoskeletal: Negative.  Negative for myalgias.  Neurological: Negative.  Negative for headaches.  Hematological: Negative.   Psychiatric/Behavioral: Negative.   All other systems reviewed and are negative.      Objective:   Physical Exam  Constitutional: She is oriented to person, place, and time. She appears well-developed and well-nourished. No distress.  HENT:  Head: Normocephalic and atraumatic.  Right Ear: External ear normal.  Mouth/Throat: Oropharynx is clear and moist.  Eyes: Pupils are equal, round, and reactive to light.  Neck: Normal range of motion. Neck supple. No thyromegaly present.  Cardiovascular: Normal rate, regular rhythm, normal heart sounds and intact distal pulses.   No murmur heard. Pulmonary/Chest: Effort normal and breath sounds normal. No respiratory distress. She has no wheezes.  Abdominal: Soft. Bowel sounds are normal. She exhibits no distension. There is no tenderness.  Musculoskeletal: Normal range of motion. She exhibits no edema or tenderness.  Neurological: She is alert and oriented to person, place, and time. She has normal reflexes. No cranial nerve deficit.  Skin: Skin is warm and dry.  Psychiatric: She has a normal mood and affect. Her behavior is normal. Judgment and thought content normal.  Vitals reviewed.   BP 133/74 mmHg  Pulse 78  Temp(Src) 97.4 F (36.3 C) (Oral)  Ht '5\' 4"'  (1.626 m)  Wt 194 lb (87.998 kg)  BMI 33.28 kg/m2       Assessment & Plan:  1. Essential hypertension - CMP14+EGFR - hydrochlorothiazide (HYDRODIURIL) 25 MG tablet; Take 1 tablet (25 mg total) by mouth daily.  Dispense: 90 tablet; Refill: 4 - metoprolol succinate (TOPROL-XL) 50  MG 24 hr tablet; Take 1 tablet (50 mg total) by mouth daily.  Dispense: 90 tablet; Refill: 4  2. Gastroesophageal  reflux disease, esophagitis presence not specified - CMP14+EGFR - omeprazole (PRILOSEC) 20 MG capsule; Take 1 capsule (20 mg total) by mouth daily. Over the counter.  Dispense: 90 capsule; Refill: 4  3. Vitamin D deficiency - CMP14+EGFR - VITAMIN D 25 Hydroxy (Vit-D Deficiency, Fractures) - Vitamin D, Ergocalciferol, (DRISDOL) 50000 units CAPS capsule; Take 1 capsule (50,000 Units total) by mouth every 7 (seven) days.  Dispense: 12 capsule; Refill: 3  4. Hyperlipidemia - CMP14+EGFR - Lipid panel - atorvastatin (LIPITOR) 40 MG tablet; Take 1 tablet (40 mg total) by mouth daily.  Dispense: 90 tablet; Refill: 3  5. Need for hepatitis C screening test  - CMP14+EGFR - Hepatitis C antibody   Continue all meds Labs pending Health Maintenance reviewed Diet and exercise encouraged RTO 6 months  Evelina Dun, FNP

## 2015-01-15 NOTE — Patient Instructions (Signed)
Health Maintenance, Female Adopting a healthy lifestyle and getting preventive care can go a long way to promote health and wellness. Talk with your health care provider about what schedule of regular examinations is right for you. This is a good chance for you to check in with your provider about disease prevention and staying healthy. In between checkups, there are plenty of things you can do on your own. Experts have done a lot of research about which lifestyle changes and preventive measures are most likely to keep you healthy. Ask your health care provider for more information. WEIGHT AND DIET  Eat a healthy diet  Be sure to include plenty of vegetables, fruits, low-fat dairy products, and lean protein.  Do not eat a lot of foods high in solid fats, added sugars, or salt.  Get regular exercise. This is one of the most important things you can do for your health.  Most adults should exercise for at least 150 minutes each week. The exercise should increase your heart rate and make you sweat (moderate-intensity exercise).  Most adults should also do strengthening exercises at least twice a week. This is in addition to the moderate-intensity exercise.  Maintain a healthy weight  Body mass index (BMI) is a measurement that can be used to identify possible weight problems. It estimates body fat based on height and weight. Your health care provider can help determine your BMI and help you achieve or maintain a healthy weight.  For females 20 years of age and older:   A BMI below 18.5 is considered underweight.  A BMI of 18.5 to 24.9 is normal.  A BMI of 25 to 29.9 is considered overweight.  A BMI of 30 and above is considered obese.  Watch levels of cholesterol and blood lipids  You should start having your blood tested for lipids and cholesterol at 63 years of age, then have this test every 5 years.  You may need to have your cholesterol levels checked more often if:  Your lipid  or cholesterol levels are high.  You are older than 63 years of age.  You are at high risk for heart disease.  CANCER SCREENING   Lung Cancer  Lung cancer screening is recommended for adults 55-80 years old who are at high risk for lung cancer because of a history of smoking.  A yearly low-dose CT scan of the lungs is recommended for people who:  Currently smoke.  Have quit within the past 15 years.  Have at least a 30-pack-year history of smoking. A pack year is smoking an average of one pack of cigarettes a day for 1 year.  Yearly screening should continue until it has been 15 years since you quit.  Yearly screening should stop if you develop a health problem that would prevent you from having lung cancer treatment.  Breast Cancer  Practice breast self-awareness. This means understanding how your breasts normally appear and feel.  It also means doing regular breast self-exams. Let your health care provider know about any changes, no matter how small.  If you are in your 20s or 30s, you should have a clinical breast exam (CBE) by a health care provider every 1-3 years as part of a regular health exam.  If you are 40 or older, have a CBE every year. Also consider having a breast X-ray (mammogram) every year.  If you have a family history of breast cancer, talk to your health care provider about genetic screening.  If you   are at high risk for breast cancer, talk to your health care provider about having an MRI and a mammogram every year.  Breast cancer gene (BRCA) assessment is recommended for women who have family members with BRCA-related cancers. BRCA-related cancers include:  Breast.  Ovarian.  Tubal.  Peritoneal cancers.  Results of the assessment will determine the need for genetic counseling and BRCA1 and BRCA2 testing. Cervical Cancer Your health care provider may recommend that you be screened regularly for cancer of the pelvic organs (ovaries, uterus, and  vagina). This screening involves a pelvic examination, including checking for microscopic changes to the surface of your cervix (Pap test). You may be encouraged to have this screening done every 3 years, beginning at age 21.  For women ages 30-65, health care providers may recommend pelvic exams and Pap testing every 3 years, or they may recommend the Pap and pelvic exam, combined with testing for human papilloma virus (HPV), every 5 years. Some types of HPV increase your risk of cervical cancer. Testing for HPV may also be done on women of any age with unclear Pap test results.  Other health care providers may not recommend any screening for nonpregnant women who are considered low risk for pelvic cancer and who do not have symptoms. Ask your health care provider if a screening pelvic exam is right for you.  If you have had past treatment for cervical cancer or a condition that could lead to cancer, you need Pap tests and screening for cancer for at least 20 years after your treatment. If Pap tests have been discontinued, your risk factors (such as having a new sexual partner) need to be reassessed to determine if screening should resume. Some women have medical problems that increase the chance of getting cervical cancer. In these cases, your health care provider may recommend more frequent screening and Pap tests. Colorectal Cancer  This type of cancer can be detected and often prevented.  Routine colorectal cancer screening usually begins at 63 years of age and continues through 63 years of age.  Your health care provider may recommend screening at an earlier age if you have risk factors for colon cancer.  Your health care provider may also recommend using home test kits to check for hidden blood in the stool.  A small camera at the end of a tube can be used to examine your colon directly (sigmoidoscopy or colonoscopy). This is done to check for the earliest forms of colorectal  cancer.  Routine screening usually begins at age 50.  Direct examination of the colon should be repeated every 5-10 years through 63 years of age. However, you may need to be screened more often if early forms of precancerous polyps or small growths are found. Skin Cancer  Check your skin from head to toe regularly.  Tell your health care provider about any new moles or changes in moles, especially if there is a change in a mole's shape or color.  Also tell your health care provider if you have a mole that is larger than the size of a pencil eraser.  Always use sunscreen. Apply sunscreen liberally and repeatedly throughout the day.  Protect yourself by wearing long sleeves, pants, a wide-brimmed hat, and sunglasses whenever you are outside. HEART DISEASE, DIABETES, AND HIGH BLOOD PRESSURE   High blood pressure causes heart disease and increases the risk of stroke. High blood pressure is more likely to develop in:  People who have blood pressure in the high end   of the normal range (130-139/85-89 mm Hg).  People who are overweight or obese.  People who are African American.  If you are 38-23 years of age, have your blood pressure checked every 3-5 years. If you are 61 years of age or older, have your blood pressure checked every year. You should have your blood pressure measured twice--once when you are at a hospital or clinic, and once when you are not at a hospital or clinic. Record the average of the two measurements. To check your blood pressure when you are not at a hospital or clinic, you can use:  An automated blood pressure machine at a pharmacy.  A home blood pressure monitor.  If you are between 45 years and 39 years old, ask your health care provider if you should take aspirin to prevent strokes.  Have regular diabetes screenings. This involves taking a blood sample to check your fasting blood sugar level.  If you are at a normal weight and have a low risk for diabetes,  have this test once every three years after 63 years of age.  If you are overweight and have a high risk for diabetes, consider being tested at a younger age or more often. PREVENTING INFECTION  Hepatitis B  If you have a higher risk for hepatitis B, you should be screened for this virus. You are considered at high risk for hepatitis B if:  You were born in a country where hepatitis B is common. Ask your health care provider which countries are considered high risk.  Your parents were born in a high-risk country, and you have not been immunized against hepatitis B (hepatitis B vaccine).  You have HIV or AIDS.  You use needles to inject street drugs.  You live with someone who has hepatitis B.  You have had sex with someone who has hepatitis B.  You get hemodialysis treatment.  You take certain medicines for conditions, including cancer, organ transplantation, and autoimmune conditions. Hepatitis C  Blood testing is recommended for:  Everyone born from [birth year] through 1964.  Anyone with known risk factors for hepatitis C. Sexually transmitted infections (STIs)  You should be screened for sexually transmitted infections (STIs) including gonorrhea and chlamydia if:  You are sexually active and are younger than 63 years of age.  You are older than 63 years of age and your health care provider tells you that you are at risk for this type of infection.  Your sexual activity has changed since you were last screened and you are at an increased risk for chlamydia or gonorrhea. Ask your health care provider if you are at risk.  If you do not have HIV, but are at risk, it may be recommended that you take a prescription medicine daily to prevent HIV infection. This is called pre-exposure prophylaxis (PrEP). You are considered at risk if:  You are sexually active and do not regularly use condoms or know the HIV status of your partner(s).  You take drugs by injection.  You are sexually  active with a partner who has HIV. Talk with your health care provider about whether you are at high risk of being infected with HIV. If you choose to begin PrEP, you should first be tested for HIV. You should then be tested every 3 months for as long as you are taking PrEP.  PREGNANCY   If you are premenopausal and you may become pregnant, ask your health care provider about preconception counseling.  If you may  become pregnant, take 400 to 800 micrograms (mcg) of folic acid every day.  If you want to prevent pregnancy, talk to your health care provider about birth control (contraception). OSTEOPOROSIS AND MENOPAUSE   Osteoporosis is a disease in which the bones lose minerals and strength with aging. This can result in serious bone fractures. Your risk for osteoporosis can be identified using a bone density scan.  If you are 61 years of age or older, or if you are at risk for osteoporosis and fractures, ask your health care provider if you should be screened.  Ask your health care provider whether you should take a calcium or vitamin D supplement to lower your risk for osteoporosis.  Menopause may have certain physical symptoms and risks.  Hormone replacement therapy may reduce some of these symptoms and risks. Talk to your health care provider about whether hormone replacement therapy is right for you.  HOME CARE INSTRUCTIONS   Schedule regular health, dental, and eye exams.  Stay current with your immunizations.   Do not use any tobacco products including cigarettes, chewing tobacco, or electronic cigarettes.  If you are pregnant, do not drink alcohol.  If you are breastfeeding, limit how much and how often you drink alcohol.  Limit alcohol intake to no more than 1 drink per day for nonpregnant women. One drink equals 12 ounces of beer, 5 ounces of wine, or 1 ounces of hard liquor.  Do not use street drugs.  Do not share needles.  Ask your health care provider for help if  you need support or information about quitting drugs.  Tell your health care provider if you often feel depressed.  Tell your health care provider if you have ever been abused or do not feel safe at home.   This information is not intended to replace advice given to you by your health care provider. Make sure you discuss any questions you have with your health care provider.   Document Released: 07/19/2010 Document Revised: 01/24/2014 Document Reviewed: 12/05/2012 Elsevier Interactive Patient Education [employer].

## 2015-01-16 ENCOUNTER — Ambulatory Visit
Admission: RE | Admit: 2015-01-16 | Discharge: 2015-01-16 | Disposition: A | Discharge status: Home / Self Care | Payer: 59 | Source: Ambulatory Visit

## 2015-01-16 DIAGNOSIS — Z1231 Encounter for screening mammogram for malignant neoplasm of breast: Secondary | ICD-10-CM

## 2015-01-16 LAB — CMP14+EGFR
ALK PHOS: 60 IU/L (ref 39–117)
ALT: 35 IU/L — ABNORMAL HIGH (ref 0–32)
AST: 23 IU/L (ref 0–40)
Albumin/Globulin Ratio: 2.4 (ref 1.1–2.5)
Albumin: 4.4 g/dL (ref 3.6–4.8)
BUN/Creatinine Ratio: 10 — ABNORMAL LOW (ref 11–26)
BUN: 8 mg/dL (ref 8–27)
Bilirubin Total: 0.4 mg/dL (ref 0.0–1.2)
CO2: 27 mmol/L (ref 18–29)
CREATININE: 0.83 mg/dL (ref 0.57–1.00)
Calcium: 9.8 mg/dL (ref 8.7–10.3)
Chloride: 97 mmol/L (ref 96–106)
GFR calc non Af Amer: 75 mL/min/{1.73_m2} (ref >59)
GFR, EST AFRICAN AMERICAN: 87 mL/min/{1.73_m2} (ref >59)
GLUCOSE: 92 mg/dL (ref 65–99)
Globulin, Total: 1.8 g/dL (ref 1.5–4.5)
Potassium: 4.1 mmol/L (ref 3.5–5.2)
Sodium: 141 mmol/L (ref 134–144)
Total Protein: 6.2 g/dL (ref 6.0–8.5)

## 2015-01-16 LAB — LIPID PANEL
CHOLESTEROL TOTAL: 177 mg/dL (ref 100–199)
Chol/HDL Ratio: 3.3 ratio units (ref 0.0–4.4)
HDL: 53 mg/dL (ref >39)
LDL CALC: 75 mg/dL (ref 0–99)
Triglycerides: 245 mg/dL — ABNORMAL HIGH (ref 0–149)
VLDL CHOLESTEROL CAL: 49 mg/dL — AB (ref 5–40)

## 2015-01-16 LAB — VITAMIN D 25 HYDROXY (VIT D DEFICIENCY, FRACTURES): VIT D 25 HYDROXY: 28.9 ng/mL — AB (ref 30.0–100.0)

## 2015-01-16 LAB — HEPATITIS C ANTIBODY: HEP C VIRUS AB: 0.1 {s_co_ratio} (ref 0.0–0.9)

## 2015-01-16 NOTE — Progress Notes (Signed)
Patient aware.

## 2015-07-17 ENCOUNTER — Ambulatory Visit: Payer: 59 | Admitting: Family

## 2015-08-04 DIAGNOSIS — M25551 Pain in right hip: Secondary | ICD-10-CM | POA: Insufficient documentation

## 2015-08-12 ENCOUNTER — Ambulatory Visit: Payer: Self-pay | Admitting: Family

## 2015-11-12 ENCOUNTER — Telehealth: Payer: Self-pay | Admitting: Family

## 2015-11-12 ENCOUNTER — Encounter: Payer: Self-pay | Admitting: Family

## 2015-11-12 ENCOUNTER — Ambulatory Visit (INDEPENDENT_AMBULATORY_CARE_PROVIDER_SITE_OTHER): Payer: BLUE CROSS/BLUE SHIELD | Admitting: Family

## 2015-11-12 VITALS — BP 133/77 | HR 77 | Temp 97.2°F | Ht 64.0 in | Wt 197.8 lb

## 2015-11-12 DIAGNOSIS — K219 Gastro-esophageal reflux disease without esophagitis: Secondary | ICD-10-CM

## 2015-11-12 DIAGNOSIS — Z0000 Encounter for general adult medical examination without abnormal findings: Secondary | ICD-10-CM | POA: Diagnosis not present

## 2015-11-12 DIAGNOSIS — E782 Mixed hyperlipidemia: Secondary | ICD-10-CM

## 2015-11-12 DIAGNOSIS — Z23 Encounter for immunization: Secondary | ICD-10-CM

## 2015-11-12 DIAGNOSIS — I10 Essential (primary) hypertension: Secondary | ICD-10-CM

## 2015-11-12 DIAGNOSIS — E669 Obesity, unspecified: Secondary | ICD-10-CM

## 2015-11-12 DIAGNOSIS — E559 Vitamin D deficiency, unspecified: Secondary | ICD-10-CM

## 2015-11-12 DIAGNOSIS — E663 Overweight: Secondary | ICD-10-CM | POA: Insufficient documentation

## 2015-11-12 MED ORDER — GABAPENTIN 100 MG PO CAPS: 100.0000 mg | ORAL_CAPSULE | Freq: Two times a day (BID) | ORAL | 2 refills | Status: DC

## 2015-11-12 MED ORDER — ATORVASTATIN CALCIUM 40 MG PO TABS: 40.0000 mg | ORAL_TABLET | Freq: Every day | ORAL | 3 refills | Status: DC

## 2015-11-12 MED ORDER — HYDROCHLOROTHIAZIDE 25 MG PO TABS: 25.0000 mg | ORAL_TABLET | Freq: Every day | ORAL | 4 refills | Status: DC

## 2015-11-12 MED ORDER — VITAMIN D (ERGOCALCIFEROL) 1.25 MG (50000 UNIT) PO CAPS: 50000.0000 [IU] | ORAL_CAPSULE | ORAL | 3 refills | Status: DC

## 2015-11-12 MED ORDER — METOPROLOL SUCCINATE ER 50 MG PO TB24: 50.0000 mg | ORAL_TABLET | Freq: Every day | ORAL | 4 refills | Status: DC

## 2015-11-12 NOTE — Telephone Encounter (Signed)
Refilled medications sent to pharmacy

## 2015-11-12 NOTE — Progress Notes (Signed)
Subjective:    Patient ID: [name], female    DOB: 16-Sep-1950, 65 y.o.   MRN: [id number]  Pt presents to the office today for CPE without pap. Hypertension  This is a chronic problem. The current episode started more than 1 year ago. The problem has been resolved since onset. The problem is controlled. Associated symptoms include peripheral edema ("at times"). Pertinent negatives include no anxiety, blurred vision, headaches, palpitations or shortness of breath. Risk factors for coronary artery disease include dyslipidemia, obesity, sedentary lifestyle and family history. Past treatments include diuretics and beta blockers. There is no history of kidney disease, CAD/MI, CVA, heart failure or a thyroid problem. There is no history of sleep apnea.  Hyperlipidemia  This is a chronic problem. The current episode started more than 1 year ago. The problem is controlled. Recent lipid tests were reviewed and are normal. She has no history of diabetes or hypothyroidism. Pertinent negatives include no leg pain, myalgias or shortness of breath. Current antihyperlipidemic treatment includes statins. The current treatment provides significant improvement of lipids. Risk factors for coronary artery disease include dyslipidemia, diabetes mellitus, hypertension, family history and a sedentary lifestyle.  Gastroesophageal Reflux  She reports no belching, no coughing, no heartburn or no wheezing. This is a chronic problem. The current episode started more than 1 year ago. The problem occurs rarely. The problem has been resolved. The symptoms are aggravated by certain foods. Associated symptoms include fatigue. She has tried a PPI for the symptoms. The treatment provided significant relief.      Review of Systems  Constitutional: Positive for fatigue.  HENT: Negative.   Eyes: Negative.  Negative for blurred vision.  Respiratory: Negative.  Negative for cough, shortness of breath and wheezing.     Cardiovascular: Negative.  Negative for palpitations.  Gastrointestinal: Negative.  Negative for heartburn.  Endocrine: Negative.   Genitourinary: Negative.   Musculoskeletal: Negative.  Negative for myalgias.  Neurological: Negative.  Negative for headaches.  Hematological: Negative.   Psychiatric/Behavioral: Negative.   All other systems reviewed and are negative.      Objective:   Physical Exam  Constitutional: She is oriented to person, place, and time. She appears well-developed and well-nourished. No distress.  HENT:  Head: Normocephalic and atraumatic.  Right Ear: External ear normal.  Mouth/Throat: Oropharynx is clear and moist.  Eyes: Pupils are equal, round, and reactive to light.  Neck: Normal range of motion. Neck supple. No thyromegaly present.  Cardiovascular: Normal rate, regular rhythm, normal heart sounds and intact distal pulses.   No murmur heard. Pulmonary/Chest: Effort normal and breath sounds normal. No respiratory distress. She has no wheezes.  Abdominal: Soft. Bowel sounds are normal. She exhibits no distension. There is no tenderness.  Musculoskeletal: Normal range of motion. She exhibits no edema or tenderness.  Neurological: She is alert and oriented to person, place, and time. She has normal reflexes. No cranial nerve deficit.  Skin: Skin is warm and dry.  Psychiatric: She has a normal mood and affect. Her behavior is normal. Judgment and thought content normal.  Vitals reviewed.   BP 133/77   Pulse 77   Temp 97.2 F (36.2 C) (Oral)   Ht '5\' 4"'  (1.626 m)   Wt 197 lb 12.8 oz (89.7 kg)   BMI 33.95 kg/m        Assessment & Plan:  1. Essential hypertension - CMP14+EGFR  2. Gastroesophageal reflux disease, esophagitis presence not specified - CMP14+EGFR  3. Mixed hyperlipidemia - CMP14+EGFR -  Lipid panel  4. Vitamin D deficiency - CMP14+EGFR - VITAMIN D 25 Hydroxy (Vit-D Deficiency, Fractures)  5. Obesity (BMI 30-39.9) -  CMP14+EGFR - Lipid panel  6. Annual physical exam - CMP14+EGFR - Lipid panel - VITAMIN D 25 Hydroxy (Vit-D Deficiency, Fractures) - Thyroid Panel With TSH - CBC with Differential/Platelet   Continue all meds Labs pending Health Maintenance reviewed- FLU and Prevnar vaccine given today Diet and exercise encouraged RTO 6 months  Evelina Dun, FNP

## 2015-11-12 NOTE — Addendum Note (Signed)
Addended by: Shelbie Ammons on: 11/12/2015 02:12 PM   Modules accepted: Orders

## 2015-11-12 NOTE — Patient Instructions (Signed)
Health Maintenance, Female Adopting a healthy lifestyle and getting preventive care can go a long way to promote health and wellness. Talk with your health care provider about what schedule of regular examinations is right for you. This is a good chance for you to check in with your provider about disease prevention and staying healthy. In between checkups, there are plenty of things you can do on your own. Experts have done a lot of research about which lifestyle changes and preventive measures are most likely to keep you healthy. Ask your health care provider for more information. WEIGHT AND DIET  Eat a healthy diet  Be sure to include plenty of vegetables, fruits, low-fat dairy products, and lean protein.  Do not eat a lot of foods high in solid fats, added sugars, or salt.  Get regular exercise. This is one of the most important things you can do for your health.  Most adults should exercise for at least 150 minutes each week. The exercise should increase your heart rate and make you sweat (moderate-intensity exercise).  Most adults should also do strengthening exercises at least twice a week. This is in addition to the moderate-intensity exercise.  Maintain a healthy weight  Body mass index (BMI) is a measurement that can be used to identify possible weight problems. It estimates body fat based on height and weight. Your health care provider can help determine your BMI and help you achieve or maintain a healthy weight.  For females 20 years of age and older:   A BMI below 18.5 is considered underweight.  A BMI of 18.5 to 24.9 is normal.  A BMI of 25 to 29.9 is considered overweight.  A BMI of 30 and above is considered obese.  Watch levels of cholesterol and blood lipids  You should start having your blood tested for lipids and cholesterol at 64 years of age, then have this test every 5 years.  You may need to have your cholesterol levels checked more often if:  Your lipid  or cholesterol levels are high.  You are older than 64 years of age.  You are at high risk for heart disease.  CANCER SCREENING   Lung Cancer  Lung cancer screening is recommended for adults 55-80 years old who are at high risk for lung cancer because of a history of smoking.  A yearly low-dose CT scan of the lungs is recommended for people who:  Currently smoke.  Have quit within the past 15 years.  Have at least a 30-pack-year history of smoking. A pack year is smoking an average of one pack of cigarettes a day for 1 year.  Yearly screening should continue until it has been 15 years since you quit.  Yearly screening should stop if you develop a health problem that would prevent you from having lung cancer treatment.  Breast Cancer  Practice breast self-awareness. This means understanding how your breasts normally appear and feel.  It also means doing regular breast self-exams. Let your health care provider know about any changes, no matter how small.  If you are in your 20s or 30s, you should have a clinical breast exam (CBE) by a health care provider every 1-3 years as part of a regular health exam.  If you are 40 or older, have a CBE every year. Also consider having a breast X-ray (mammogram) every year.  If you have a family history of breast cancer, talk to your health care provider about genetic screening.  If you   are at high risk for breast cancer, talk to your health care provider about having an MRI and a mammogram every year.  Breast cancer gene (BRCA) assessment is recommended for women who have family members with BRCA-related cancers. BRCA-related cancers include:  Breast.  Ovarian.  Tubal.  Peritoneal cancers.  Results of the assessment will determine the need for genetic counseling and BRCA1 and BRCA2 testing. Cervical Cancer Your health care provider may recommend that you be screened regularly for cancer of the pelvic organs (ovaries, uterus, and  vagina). This screening involves a pelvic examination, including checking for microscopic changes to the surface of your cervix (Pap test). You may be encouraged to have this screening done every 3 years, beginning at age 21.  For women ages 30-65, health care providers may recommend pelvic exams and Pap testing every 3 years, or they may recommend the Pap and pelvic exam, combined with testing for human papilloma virus (HPV), every 5 years. Some types of HPV increase your risk of cervical cancer. Testing for HPV may also be done on women of any age with unclear Pap test results.  Other health care providers may not recommend any screening for nonpregnant women who are considered low risk for pelvic cancer and who do not have symptoms. Ask your health care provider if a screening pelvic exam is right for you.  If you have had past treatment for cervical cancer or a condition that could lead to cancer, you need Pap tests and screening for cancer for at least 20 years after your treatment. If Pap tests have been discontinued, your risk factors (such as having a new sexual partner) need to be reassessed to determine if screening should resume. Some women have medical problems that increase the chance of getting cervical cancer. In these cases, your health care provider may recommend more frequent screening and Pap tests. Colorectal Cancer  This type of cancer can be detected and often prevented.  Routine colorectal cancer screening usually begins at 64 years of age and continues through 64 years of age.  Your health care provider may recommend screening at an earlier age if you have risk factors for colon cancer.  Your health care provider may also recommend using home test kits to check for hidden blood in the stool.  A small camera at the end of a tube can be used to examine your colon directly (sigmoidoscopy or colonoscopy). This is done to check for the earliest forms of colorectal  cancer.  Routine screening usually begins at age 50.  Direct examination of the colon should be repeated every 5-10 years through 64 years of age. However, you may need to be screened more often if early forms of precancerous polyps or small growths are found. Skin Cancer  Check your skin from head to toe regularly.  Tell your health care provider about any new moles or changes in moles, especially if there is a change in a mole's shape or color.  Also tell your health care provider if you have a mole that is larger than the size of a pencil eraser.  Always use sunscreen. Apply sunscreen liberally and repeatedly throughout the day.  Protect yourself by wearing long sleeves, pants, a wide-brimmed hat, and sunglasses whenever you are outside. HEART DISEASE, DIABETES, AND HIGH BLOOD PRESSURE   High blood pressure causes heart disease and increases the risk of stroke. High blood pressure is more likely to develop in:  People who have blood pressure in the high end   of the normal range (130-139/85-89 mm Hg).  People who are overweight or obese.  People who are African American.  If you are 38-23 years of age, have your blood pressure checked every 3-5 years. If you are 61 years of age or older, have your blood pressure checked every year. You should have your blood pressure measured twice--once when you are at a hospital or clinic, and once when you are not at a hospital or clinic. Record the average of the two measurements. To check your blood pressure when you are not at a hospital or clinic, you can use:  An automated blood pressure machine at a pharmacy.  A home blood pressure monitor.  If you are between 45 years and 39 years old, ask your health care provider if you should take aspirin to prevent strokes.  Have regular diabetes screenings. This involves taking a blood sample to check your fasting blood sugar level.  If you are at a normal weight and have a low risk for diabetes,  have this test once every three years after 64 years of age.  If you are overweight and have a high risk for diabetes, consider being tested at a younger age or more often. PREVENTING INFECTION  Hepatitis B  If you have a higher risk for hepatitis B, you should be screened for this virus. You are considered at high risk for hepatitis B if:  You were born in a country where hepatitis B is common. Ask your health care provider which countries are considered high risk.  Your parents were born in a high-risk country, and you have not been immunized against hepatitis B (hepatitis B vaccine).  You have HIV or AIDS.  You use needles to inject street drugs.  You live with someone who has hepatitis B.  You have had sex with someone who has hepatitis B.  You get hemodialysis treatment.  You take certain medicines for conditions, including cancer, organ transplantation, and autoimmune conditions. Hepatitis C  Blood testing is recommended for:  Everyone born from [birth year] through 1964.  Anyone with known risk factors for hepatitis C. Sexually transmitted infections (STIs)  You should be screened for sexually transmitted infections (STIs) including gonorrhea and chlamydia if:  You are sexually active and are younger than 64 years of age.  You are older than 64 years of age and your health care provider tells you that you are at risk for this type of infection.  Your sexual activity has changed since you were last screened and you are at an increased risk for chlamydia or gonorrhea. Ask your health care provider if you are at risk.  If you do not have HIV, but are at risk, it may be recommended that you take a prescription medicine daily to prevent HIV infection. This is called pre-exposure prophylaxis (PrEP). You are considered at risk if:  You are sexually active and do not regularly use condoms or know the HIV status of your partner(s).  You take drugs by injection.  You are sexually  active with a partner who has HIV. Talk with your health care provider about whether you are at high risk of being infected with HIV. If you choose to begin PrEP, you should first be tested for HIV. You should then be tested every 3 months for as long as you are taking PrEP.  PREGNANCY   If you are premenopausal and you may become pregnant, ask your health care provider about preconception counseling.  If you may  become pregnant, take 400 to 800 micrograms (mcg) of folic acid every day.  If you want to prevent pregnancy, talk to your health care provider about birth control (contraception). OSTEOPOROSIS AND MENOPAUSE   Osteoporosis is a disease in which the bones lose minerals and strength with aging. This can result in serious bone fractures. Your risk for osteoporosis can be identified using a bone density scan.  If you are 61 years of age or older, or if you are at risk for osteoporosis and fractures, ask your health care provider if you should be screened.  Ask your health care provider whether you should take a calcium or vitamin D supplement to lower your risk for osteoporosis.  Menopause may have certain physical symptoms and risks.  Hormone replacement therapy may reduce some of these symptoms and risks. Talk to your health care provider about whether hormone replacement therapy is right for you.  HOME CARE INSTRUCTIONS   Schedule regular health, dental, and eye exams.  Stay current with your immunizations.   Do not use any tobacco products including cigarettes, chewing tobacco, or electronic cigarettes.  If you are pregnant, do not drink alcohol.  If you are breastfeeding, limit how much and how often you drink alcohol.  Limit alcohol intake to no more than 1 drink per day for nonpregnant women. One drink equals 12 ounces of beer, 5 ounces of wine, or 1 ounces of hard liquor.  Do not use street drugs.  Do not share needles.  Ask your health care provider for help if  you need support or information about quitting drugs.  Tell your health care provider if you often feel depressed.  Tell your health care provider if you have ever been abused or do not feel safe at home.   This information is not intended to replace advice given to you by your health care provider. Make sure you discuss any questions you have with your health care provider.   Document Released: 07/19/2010 Document Revised: 01/24/2014 Document Reviewed: 12/05/2012 Elsevier Interactive Patient Education [employer].

## 2015-11-13 LAB — CMP14+EGFR
A/G RATIO: 1.9 (ref 1.2–2.2)
ALT: 40 IU/L — AB (ref 0–32)
AST: 23 IU/L (ref 0–40)
Albumin: 4.2 g/dL (ref 3.6–4.8)
Alkaline Phosphatase: 54 IU/L (ref 39–117)
BUN/Creatinine Ratio: 11 — ABNORMAL LOW (ref 12–28)
BUN: 10 mg/dL (ref 8–27)
Bilirubin Total: 0.3 mg/dL (ref 0.0–1.2)
CALCIUM: 9.7 mg/dL (ref 8.7–10.3)
CHLORIDE: 101 mmol/L (ref 96–106)
CO2: 26 mmol/L (ref 18–29)
Creatinine, Ser: 0.9 mg/dL (ref 0.57–1.00)
GFR calc Af Amer: 78 mL/min/{1.73_m2} (ref >59)
GFR, EST NON AFRICAN AMERICAN: 68 mL/min/{1.73_m2} (ref >59)
GLUCOSE: 101 mg/dL — AB (ref 65–99)
Globulin, Total: 2.2 g/dL (ref 1.5–4.5)
POTASSIUM: 3.6 mmol/L (ref 3.5–5.2)
Sodium: 142 mmol/L (ref 134–144)
Total Protein: 6.4 g/dL (ref 6.0–8.5)

## 2015-11-13 LAB — CBC WITH DIFFERENTIAL/PLATELET
BASOS ABS: 0 10*3/uL (ref 0.0–0.2)
Basos: 0 %
EOS (ABSOLUTE): 0.2 10*3/uL (ref 0.0–0.4)
Eos: 2 %
Hematocrit: 35.1 % (ref 34.0–46.6)
Hemoglobin: 11.2 g/dL (ref 11.1–15.9)
IMMATURE GRANS (ABS): 0 10*3/uL (ref 0.0–0.1)
IMMATURE GRANULOCYTES: 0 %
LYMPHS: 42 %
Lymphocytes Absolute: 3.8 10*3/uL — ABNORMAL HIGH (ref 0.7–3.1)
MCH: 27.9 pg (ref 26.6–33.0)
MCHC: 31.9 g/dL (ref 31.5–35.7)
MCV: 87 fL (ref 79–97)
Monocytes Absolute: 0.5 10*3/uL (ref 0.1–0.9)
Monocytes: 6 %
NEUTROS PCT: 50 %
Neutrophils Absolute: 4.4 10*3/uL (ref 1.4–7.0)
PLATELETS: 366 10*3/uL (ref 150–379)
RBC: 4.02 x10E6/uL (ref 3.77–5.28)
RDW: 14.7 % (ref 12.3–15.4)
WBC: 9 10*3/uL (ref 3.4–10.8)

## 2015-11-13 LAB — THYROID PANEL WITH TSH
FREE THYROXINE INDEX: 2.1 (ref 1.2–4.9)
T3 UPTAKE RATIO: 25 % (ref 24–39)
T4, Total: 8.2 ug/dL (ref 4.5–12.0)
TSH: 2.51 u[IU]/mL (ref 0.450–4.500)

## 2015-11-13 LAB — LIPID PANEL
CHOL/HDL RATIO: 3.2 ratio (ref 0.0–4.4)
Cholesterol, Total: 151 mg/dL (ref 100–199)
HDL: 47 mg/dL (ref >39)
LDL Calculated: 76 mg/dL (ref 0–99)
TRIGLYCERIDES: 139 mg/dL (ref 0–149)
VLDL Cholesterol Cal: 28 mg/dL (ref 5–40)

## 2015-11-13 LAB — VITAMIN D 25 HYDROXY (VIT D DEFICIENCY, FRACTURES): Vit D, 25-Hydroxy: 44.1 ng/mL (ref 30.0–100.0)

## 2016-01-07 ENCOUNTER — Telehealth: Payer: Self-pay | Admitting: Family

## 2016-01-07 NOTE — Telephone Encounter (Signed)
Pt was told by Dr in [name] that she has advanced arthritis in hips and will eventually need surgery she is wondering if she could get an Rx for Celebrex sent in to pharmacy.

## 2016-01-08 MED ORDER — CELECOXIB 100 MG PO CAPS
100.0000 mg | ORAL_CAPSULE | Freq: Two times a day (BID) | ORAL | 1 refills | Status: DC
Start: 2016-01-08 — End: 2017-05-18

## 2016-01-08 NOTE — Telephone Encounter (Signed)
Prescription sent to pharmacy.

## 2016-01-08 NOTE — Telephone Encounter (Signed)
Patient aware.

## 2016-02-02 ENCOUNTER — Ambulatory Visit (INDEPENDENT_AMBULATORY_CARE_PROVIDER_SITE_OTHER): Payer: Self-pay

## 2016-02-02 ENCOUNTER — Encounter (INDEPENDENT_AMBULATORY_CARE_PROVIDER_SITE_OTHER): Payer: Self-pay

## 2016-02-02 ENCOUNTER — Ambulatory Visit (INDEPENDENT_AMBULATORY_CARE_PROVIDER_SITE_OTHER): Payer: BLUE CROSS/BLUE SHIELD | Admitting: Orthopaedic Surgery

## 2016-02-02 DIAGNOSIS — G8929 Other chronic pain: Secondary | ICD-10-CM

## 2016-02-02 DIAGNOSIS — M25562 Pain in left knee: Secondary | ICD-10-CM

## 2016-02-02 DIAGNOSIS — M1712 Unilateral primary osteoarthritis, left knee: Secondary | ICD-10-CM | POA: Diagnosis not present

## 2016-02-02 DIAGNOSIS — M25552 Pain in left hip: Secondary | ICD-10-CM | POA: Diagnosis not present

## 2016-02-02 DIAGNOSIS — M25561 Pain in right knee: Secondary | ICD-10-CM | POA: Diagnosis not present

## 2016-02-02 DIAGNOSIS — M1612 Unilateral primary osteoarthritis, left hip: Secondary | ICD-10-CM | POA: Diagnosis not present

## 2016-02-02 DIAGNOSIS — M25551 Pain in right hip: Secondary | ICD-10-CM | POA: Diagnosis not present

## 2016-02-02 DIAGNOSIS — M1611 Unilateral primary osteoarthritis, right hip: Secondary | ICD-10-CM

## 2016-02-02 DIAGNOSIS — M1711 Unilateral primary osteoarthritis, right knee: Secondary | ICD-10-CM | POA: Diagnosis not present

## 2016-02-02 NOTE — Progress Notes (Signed)
Office Visit Note   Patient: [name]           Date of Birth: 03/22/1950           MRN: [id number] Visit Date: 02/02/2016              Requested by: [address], [place], [place] [address] PCP: Evelina Dun, FNP   Assessment & Plan: Visit Diagnoses:  1. Left hip pain   2. Pain in right hip   3. Chronic pain of left knee   4. Chronic pain of right knee   5. Unilateral primary osteoarthritis, left knee   6. Unilateral primary osteoarthritis, right knee   7. Unilateral primary osteoarthritis, left hip   8. Unilateral primary osteoarthritis, right hip     Plan: At this point I do feel that at some point she will need bilateral knee replacement surgeries and bilateral hip replacement surgery. Her right knee is lavaged her the most so we proceed with a right total knee arthroplasty first. She would need a revision component as a primary component of the tibia side due to her previous surgery that involve removing a bony tumor and replacement of that with bone cement. That would make that surgery little bit more, located and involved revision components. I spent a considerable amount time with her in this visit. We showed her knee and hip models and her x-rays and spent a significant amount of time explaining the risks and benefits of surgery and our goals. At this point she is failed all forms conservative treatment. Her pain is been 10 out of 10. It is daily and is detrimentally affected her activities daily living, her quality of life, and her mobility. She will call when she decides to have surgery on the right knee set up  Follow-Up Instructions: Return if symptoms worsen or fail to improve.   Orders:  Orders Placed This Encounter  Procedures  . XR Knee 1-2 Views Left  . XR Knee 1-2 Views Right  . XR HIP UNILAT W OR W/O PELVIS 1V LEFT  . XR HIP UNILAT W OR W/O PELVIS 1V RIGHT   No orders of the defined types were placed in this encounter.     Procedures: No procedures performed   Clinical Data: No additional findings.   Subjective: No chief complaint on file. She is a very pleasant individual comes in with chief complaint of bilateral hip pain and bilateral knee pain. The right knee hurts worse than the right hip and the right knee hurts worse than the left knee. The right hip hurts worse than the left hip. Her pain is daily and is detrimentally affected her mobility and her quality of life. 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  Physical Exam She is alert and oriented 3 in no acute distress Ortho Exam examination of her right knee shows painful range of motion of that knee. Involves the medial and lateral joint lines of the patellofemoral joint with [place] crepitation. The knee is ligamentously stable. Examination of left knee shows a ligamentously stable knee with medial joint line tenderness and patellofemoral crepitation with a mild varus deformity. Range of motion full that he is ligamentously stable. Examination of  both hips have pain in the groin with pain to internal/external rotation of both hips. Specialty Comments:  No specialty comments available.  Imaging: Xr Hip Unilat W Or W/o Pelvis 1v Left  Result Date: 02/02/2016 AP pelvis and lateral of her left hip shows significant arthritis. There is periarticular osteophytes and flattening of the femoral head. This appeared lateral joint space narrowing. They're sclerotic changes as well.  Xr Hip Unilat W Or W/o Pelvis 1v Right  Result Date: 02/02/2016 An AP pelvis lateral of her right hip show severe end-stage arthritis. Cystic changes in the femoral head. There is superior lateral joint space narrowing. There are sclerotic changes and  para-articular osteophytes.  Xr Knee 1-2 Views Left  Result Date: 02/02/2016 Her left knee shows a mild varus deformity. There is tricompartmental arthritic changes involving mainly the patellofemoral joint and the medial compartment of her knee.  Xr Knee 1-2 Views Right  Result Date: 02/02/2016 AP and lateral of the right knee shows tricompartmental arthritic changes. There is a large area of methylmethacrylate in her knee from where previous tumor surgery was performed. The overall alignment is well maintained.    PMFS History: Patient Active Problem List   Diagnosis Date Noted  . Unilateral primary osteoarthritis, left knee 02/02/2016  . Unilateral primary osteoarthritis, right hip 02/02/2016  . Obesity (BMI 30-39.9) 11/12/2015  . Vitamin D deficiency 02/24/2014  . Essential hypertension 02/21/2014  . Hyperlipidemia 02/21/2014  . GERD (gastroesophageal reflux disease) 02/21/2014   Past Medical History:  Diagnosis Date  . GERD (gastroesophageal reflux disease)   . Hiatal hernia   . Hyperlipidemia   . Hypertension     Family History  Problem Relation Age of Onset  . Hypertension Father   . Hypertension Mother   . Hypertension Brother   . Hypertension Sister   . Colon cancer Neg Hx     Past Surgical History:  Procedure Laterality Date  . CHOLECYSTECTOMY    . COLONOSCOPY  2001  . TUBAL LIGATION    . 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    . UPPER GASTROINTESTINAL ENDOSCOPY     Social History   Occupational History  . Not on file.   Social History Main Topics  . Smoking status: Never Smoker  . Smokeless tobacco: Never Used  . Alcohol use No  . Drug use: No  . Sexual activity: Not Currently

## 2016-04-04 ENCOUNTER — Other Ambulatory Visit: Payer: Self-pay | Admitting: Family

## 2016-04-22 ENCOUNTER — Other Ambulatory Visit: Payer: Self-pay | Admitting: Physician Assistant

## 2016-04-22 DIAGNOSIS — Z1231 Encounter for screening mammogram for malignant neoplasm of breast: Secondary | ICD-10-CM

## 2016-05-10 ENCOUNTER — Ambulatory Visit
Admission: RE | Admit: 2016-05-10 | Discharge: 2016-05-10 | Disposition: A | Discharge status: Home / Self Care | Payer: Medicare HMO | Source: Ambulatory Visit | Attending: Physician Assistant | Admitting: Physician Assistant

## 2016-05-10 DIAGNOSIS — Z1231 Encounter for screening mammogram for malignant neoplasm of breast: Secondary | ICD-10-CM

## 2016-05-11 ENCOUNTER — Other Ambulatory Visit: Payer: Self-pay | Admitting: Physician Assistant

## 2016-05-11 DIAGNOSIS — N63 Unspecified lump in unspecified breast: Secondary | ICD-10-CM

## 2016-05-12 ENCOUNTER — Ambulatory Visit (INDEPENDENT_AMBULATORY_CARE_PROVIDER_SITE_OTHER): Payer: Medicare HMO | Admitting: Family

## 2016-05-12 ENCOUNTER — Encounter: Payer: Self-pay | Admitting: Family

## 2016-05-12 VITALS — BP 120/74 | HR 85 | Temp 97.5°F | Ht 64.0 in | Wt 197.6 lb

## 2016-05-12 DIAGNOSIS — I10 Essential (primary) hypertension: Secondary | ICD-10-CM | POA: Diagnosis not present

## 2016-05-12 DIAGNOSIS — N6459 Other signs and symptoms in breast: Secondary | ICD-10-CM

## 2016-05-12 DIAGNOSIS — K219 Gastro-esophageal reflux disease without esophagitis: Secondary | ICD-10-CM | POA: Diagnosis not present

## 2016-05-12 DIAGNOSIS — R609 Edema, unspecified: Secondary | ICD-10-CM

## 2016-05-12 DIAGNOSIS — Z780 Asymptomatic menopausal state: Secondary | ICD-10-CM | POA: Diagnosis not present

## 2016-05-12 DIAGNOSIS — E559 Vitamin D deficiency, unspecified: Secondary | ICD-10-CM | POA: Diagnosis not present

## 2016-05-12 DIAGNOSIS — E782 Mixed hyperlipidemia: Secondary | ICD-10-CM | POA: Diagnosis not present

## 2016-05-12 DIAGNOSIS — E669 Obesity, unspecified: Secondary | ICD-10-CM

## 2016-05-12 DIAGNOSIS — M1611 Unilateral primary osteoarthritis, right hip: Secondary | ICD-10-CM

## 2016-05-12 DIAGNOSIS — M1712 Unilateral primary osteoarthritis, left knee: Secondary | ICD-10-CM

## 2016-05-12 LAB — CMP14+EGFR
A/G RATIO: 1.9 (ref 1.2–2.2)
ALBUMIN: 4.4 g/dL (ref 3.6–4.8)
ALT: 30 IU/L (ref 0–32)
AST: 21 IU/L (ref 0–40)
Alkaline Phosphatase: 70 IU/L (ref 39–117)
BILIRUBIN TOTAL: 0.3 mg/dL (ref 0.0–1.2)
BUN / CREAT RATIO: 12 (ref 12–28)
BUN: 10 mg/dL (ref 8–27)
CHLORIDE: 99 mmol/L (ref 96–106)
CO2: 26 mmol/L (ref 18–29)
Calcium: 9.9 mg/dL (ref 8.7–10.3)
Creatinine, Ser: 0.86 mg/dL (ref 0.57–1.00)
GFR calc non Af Amer: 71 mL/min/{1.73_m2} (ref >59)
GFR, EST AFRICAN AMERICAN: 82 mL/min/{1.73_m2} (ref >59)
GLUCOSE: 103 mg/dL — AB (ref 65–99)
Globulin, Total: 2.3 g/dL (ref 1.5–4.5)
Potassium: 4.5 mmol/L (ref 3.5–5.2)
Sodium: 142 mmol/L (ref 134–144)
TOTAL PROTEIN: 6.7 g/dL (ref 6.0–8.5)

## 2016-05-12 LAB — LIPID PANEL
CHOL/HDL RATIO: 3 ratio (ref 0.0–4.4)
Cholesterol, Total: 153 mg/dL (ref 100–199)
HDL: 51 mg/dL (ref >39)
LDL Calculated: 69 mg/dL (ref 0–99)
Triglycerides: 165 mg/dL — ABNORMAL HIGH (ref 0–149)
VLDL CHOLESTEROL CAL: 33 mg/dL (ref 5–40)

## 2016-05-12 NOTE — Patient Instructions (Signed)

## 2016-05-12 NOTE — Progress Notes (Signed)
Subjective:    Patient ID: [name], female    DOB: 1950/04/05, 66 y.o.   MRN: [id number]  Pt presents to the office today for chronic follow up. Pt states she went to have her mammogram two days ago and states she told them she thought she felt a "spot" on left breast. PT was told she needed to follow up her.  Hypertension  This is a chronic problem. The current episode started more than 1 year ago. The problem has been resolved since onset. The problem is controlled. Associated symptoms include peripheral edema. Pertinent negatives include no headaches or shortness of breath. Past treatments include beta blockers and diuretics. The current treatment provides moderate improvement.  Hyperlipidemia  This is a chronic problem. The current episode started more than 1 year ago. The problem is controlled. Recent lipid tests were reviewed and are normal. Pertinent negatives include no shortness of breath. Current antihyperlipidemic treatment includes statins. The current treatment provides moderate improvement of lipids.  Gastroesophageal Reflux  She complains of heartburn. She reports no belching. This is a chronic problem. The current episode started more than 1 year ago. The problem occurs occasionally. Risk factors include obesity. She has tried a PPI for the symptoms. The treatment provided significant relief.  Arthritis  Presents for follow-up visit. She complains of pain and stiffness. The symptoms have been worsening. Affected locations include the right hip and left hip. Her pain is at a severity of 10/10.  Peripheral Edema PT states she has trace amount of swelling in BLE. She sits for long periods of time.     Review of Systems  Respiratory: Negative for shortness of breath.   Gastrointestinal: Positive for heartburn.  Musculoskeletal: Positive for arthralgias, arthritis and stiffness.  Neurological: Negative for headaches.  All other systems reviewed and are negative.        Objective:   Physical Exam  Constitutional: She is oriented to person, place, and time. She appears well-developed and well-nourished. No distress.  HENT:  Head: Normocephalic and atraumatic.  Right Ear: External ear normal.  Left Ear: External ear normal.  Mouth/Throat: Oropharynx is clear and moist.  Eyes: Pupils are equal, round, and reactive to light.  Neck: Normal range of motion. Neck supple. No thyromegaly present.  Cardiovascular: Normal rate, regular rhythm, normal heart sounds and intact distal pulses.   No murmur heard. Pulmonary/Chest: Effort normal and breath sounds normal. No respiratory distress. She has no wheezes. Right breast exhibits no inverted nipple, no mass and no tenderness. Left breast exhibits tenderness. Left breast exhibits no inverted nipple and no mass.  Abdominal: Soft. Bowel sounds are normal. She exhibits no distension. There is no tenderness.  Musculoskeletal: Normal range of motion. She exhibits edema (trace in BLE ankle). She exhibits no tenderness.  Neurological: She is alert and oriented to person, place, and time.  Skin: Skin is warm and dry.  Psychiatric: She has a normal mood and affect. Her behavior is normal. Judgment and thought content normal.  Vitals reviewed.     BP 120/74   Pulse 85   Temp 97.5 F (36.4 C) (Oral)   Ht _0  (1.626 m)   Wt 197 lb 9.6 oz (89.6 kg)   BMI 33.92 kg/m      Assessment & Plan:  1. Essential hypertension - CMP14+EGFR  2. Gastroesophageal reflux disease, esophagitis presence not specified - CMP14+EGFR  3. Unilateral primary osteoarthritis, left knee - CMP14+EGFR  4. Unilateral primary osteoarthritis, right hip - CMP14+EGFR  5. Vitamin D deficiency - CMP14+EGFR  6. Obesity (BMI 30-39.9) - CMP14+EGFR  7. Mixed hyperlipidemia - CMP14+EGFR - Lipid panel  8. Post-menopausal - CMP14+EGFR - DG [facility] DEXA  9. Abnormal breast finding - MM Digital Diagnostic Bilat; Future  10. Peripheral  edema -Low salt diet Keep elevated when possible - Compression stockings   Continue all meds Labs pending Health Maintenance reviewed Diet and exercise encouraged RTO 6 months  Evelina Dun, FNP

## 2016-05-13 ENCOUNTER — Ambulatory Visit: Payer: Self-pay

## 2016-05-19 ENCOUNTER — Other Ambulatory Visit: Payer: Self-pay | Admitting: Physician Assistant

## 2016-05-19 ENCOUNTER — Other Ambulatory Visit: Payer: Self-pay

## 2016-05-19 ENCOUNTER — Ambulatory Visit (INDEPENDENT_AMBULATORY_CARE_PROVIDER_SITE_OTHER): Payer: Medicare HMO

## 2016-05-19 DIAGNOSIS — Z780 Asymptomatic menopausal state: Secondary | ICD-10-CM

## 2016-05-19 DIAGNOSIS — N63 Unspecified lump in unspecified breast: Secondary | ICD-10-CM

## 2016-05-20 DIAGNOSIS — M9906 Segmental and somatic dysfunction of lower extremity: Secondary | ICD-10-CM | POA: Diagnosis not present

## 2016-05-20 DIAGNOSIS — M25551 Pain in right hip: Secondary | ICD-10-CM | POA: Diagnosis not present

## 2016-05-20 DIAGNOSIS — M5441 Lumbago with sciatica, right side: Secondary | ICD-10-CM | POA: Diagnosis not present

## 2016-05-20 DIAGNOSIS — M25561 Pain in right knee: Secondary | ICD-10-CM | POA: Diagnosis not present

## 2016-05-20 DIAGNOSIS — M9903 Segmental and somatic dysfunction of lumbar region: Secondary | ICD-10-CM | POA: Diagnosis not present

## 2016-05-20 DIAGNOSIS — M9902 Segmental and somatic dysfunction of thoracic region: Secondary | ICD-10-CM | POA: Diagnosis not present

## 2016-05-20 DIAGNOSIS — M9905 Segmental and somatic dysfunction of pelvic region: Secondary | ICD-10-CM | POA: Diagnosis not present

## 2016-05-23 DIAGNOSIS — M25561 Pain in right knee: Secondary | ICD-10-CM | POA: Diagnosis not present

## 2016-05-23 DIAGNOSIS — M9903 Segmental and somatic dysfunction of lumbar region: Secondary | ICD-10-CM | POA: Diagnosis not present

## 2016-05-23 DIAGNOSIS — M9905 Segmental and somatic dysfunction of pelvic region: Secondary | ICD-10-CM | POA: Diagnosis not present

## 2016-05-23 DIAGNOSIS — M5441 Lumbago with sciatica, right side: Secondary | ICD-10-CM | POA: Diagnosis not present

## 2016-05-23 DIAGNOSIS — M25551 Pain in right hip: Secondary | ICD-10-CM | POA: Diagnosis not present

## 2016-05-23 DIAGNOSIS — M9902 Segmental and somatic dysfunction of thoracic region: Secondary | ICD-10-CM | POA: Diagnosis not present

## 2016-05-23 DIAGNOSIS — M9906 Segmental and somatic dysfunction of lower extremity: Secondary | ICD-10-CM | POA: Diagnosis not present

## 2016-05-25 DIAGNOSIS — M25561 Pain in right knee: Secondary | ICD-10-CM | POA: Diagnosis not present

## 2016-05-25 DIAGNOSIS — M9902 Segmental and somatic dysfunction of thoracic region: Secondary | ICD-10-CM | POA: Diagnosis not present

## 2016-05-25 DIAGNOSIS — M9905 Segmental and somatic dysfunction of pelvic region: Secondary | ICD-10-CM | POA: Diagnosis not present

## 2016-05-25 DIAGNOSIS — M5441 Lumbago with sciatica, right side: Secondary | ICD-10-CM | POA: Diagnosis not present

## 2016-05-25 DIAGNOSIS — M25551 Pain in right hip: Secondary | ICD-10-CM | POA: Diagnosis not present

## 2016-05-25 DIAGNOSIS — M9903 Segmental and somatic dysfunction of lumbar region: Secondary | ICD-10-CM | POA: Diagnosis not present

## 2016-05-25 DIAGNOSIS — M9906 Segmental and somatic dysfunction of lower extremity: Secondary | ICD-10-CM | POA: Diagnosis not present

## 2016-05-31 DIAGNOSIS — M5441 Lumbago with sciatica, right side: Secondary | ICD-10-CM | POA: Diagnosis not present

## 2016-05-31 DIAGNOSIS — M9902 Segmental and somatic dysfunction of thoracic region: Secondary | ICD-10-CM | POA: Diagnosis not present

## 2016-05-31 DIAGNOSIS — M9905 Segmental and somatic dysfunction of pelvic region: Secondary | ICD-10-CM | POA: Diagnosis not present

## 2016-05-31 DIAGNOSIS — M25561 Pain in right knee: Secondary | ICD-10-CM | POA: Diagnosis not present

## 2016-05-31 DIAGNOSIS — M9903 Segmental and somatic dysfunction of lumbar region: Secondary | ICD-10-CM | POA: Diagnosis not present

## 2016-05-31 DIAGNOSIS — M25551 Pain in right hip: Secondary | ICD-10-CM | POA: Diagnosis not present

## 2016-05-31 DIAGNOSIS — M9906 Segmental and somatic dysfunction of lower extremity: Secondary | ICD-10-CM | POA: Diagnosis not present

## 2016-06-03 ENCOUNTER — Ambulatory Visit
Admission: RE | Admit: 2016-06-03 | Discharge: 2016-06-03 | Disposition: A | Discharge status: Home / Self Care | Payer: Medicare HMO | Source: Ambulatory Visit | Attending: Physician Assistant | Admitting: Physician Assistant

## 2016-06-03 DIAGNOSIS — N63 Unspecified lump in unspecified breast: Secondary | ICD-10-CM

## 2016-06-03 DIAGNOSIS — R928 Other abnormal and inconclusive findings on diagnostic imaging of breast: Secondary | ICD-10-CM | POA: Diagnosis not present

## 2016-06-03 DIAGNOSIS — N6489 Other specified disorders of breast: Secondary | ICD-10-CM | POA: Diagnosis not present

## 2016-06-06 DIAGNOSIS — M5441 Lumbago with sciatica, right side: Secondary | ICD-10-CM | POA: Diagnosis not present

## 2016-06-06 DIAGNOSIS — M9903 Segmental and somatic dysfunction of lumbar region: Secondary | ICD-10-CM | POA: Diagnosis not present

## 2016-06-06 DIAGNOSIS — M9905 Segmental and somatic dysfunction of pelvic region: Secondary | ICD-10-CM | POA: Diagnosis not present

## 2016-06-06 DIAGNOSIS — M25551 Pain in right hip: Secondary | ICD-10-CM | POA: Diagnosis not present

## 2016-06-06 DIAGNOSIS — M9902 Segmental and somatic dysfunction of thoracic region: Secondary | ICD-10-CM | POA: Diagnosis not present

## 2016-06-06 DIAGNOSIS — M25561 Pain in right knee: Secondary | ICD-10-CM | POA: Diagnosis not present

## 2016-06-06 DIAGNOSIS — M9906 Segmental and somatic dysfunction of lower extremity: Secondary | ICD-10-CM | POA: Diagnosis not present

## 2016-06-22 DIAGNOSIS — M5441 Lumbago with sciatica, right side: Secondary | ICD-10-CM | POA: Diagnosis not present

## 2016-06-22 DIAGNOSIS — M9903 Segmental and somatic dysfunction of lumbar region: Secondary | ICD-10-CM | POA: Diagnosis not present

## 2016-06-22 DIAGNOSIS — M9905 Segmental and somatic dysfunction of pelvic region: Secondary | ICD-10-CM | POA: Diagnosis not present

## 2016-06-22 DIAGNOSIS — M25561 Pain in right knee: Secondary | ICD-10-CM | POA: Diagnosis not present

## 2016-06-22 DIAGNOSIS — M9906 Segmental and somatic dysfunction of lower extremity: Secondary | ICD-10-CM | POA: Diagnosis not present

## 2016-06-22 DIAGNOSIS — M9902 Segmental and somatic dysfunction of thoracic region: Secondary | ICD-10-CM | POA: Diagnosis not present

## 2016-06-22 DIAGNOSIS — M25551 Pain in right hip: Secondary | ICD-10-CM | POA: Diagnosis not present

## 2016-08-02 DIAGNOSIS — H5200 Hypermetropia, unspecified eye: Secondary | ICD-10-CM | POA: Diagnosis not present

## 2016-09-08 ENCOUNTER — Other Ambulatory Visit: Payer: Self-pay | Admitting: Family

## 2016-09-08 DIAGNOSIS — E782 Mixed hyperlipidemia: Secondary | ICD-10-CM

## 2016-10-18 DIAGNOSIS — L03031 Cellulitis of right toe: Secondary | ICD-10-CM | POA: Diagnosis not present

## 2016-10-18 DIAGNOSIS — M79674 Pain in right toe(s): Secondary | ICD-10-CM | POA: Diagnosis not present

## 2016-10-25 DIAGNOSIS — R69 Illness, unspecified: Secondary | ICD-10-CM | POA: Diagnosis not present

## 2016-11-01 DIAGNOSIS — M79674 Pain in right toe(s): Secondary | ICD-10-CM | POA: Diagnosis not present

## 2016-11-01 DIAGNOSIS — L03031 Cellulitis of right toe: Secondary | ICD-10-CM | POA: Diagnosis not present

## 2016-11-02 ENCOUNTER — Other Ambulatory Visit: Payer: Self-pay | Admitting: Family

## 2016-11-02 DIAGNOSIS — E559 Vitamin D deficiency, unspecified: Secondary | ICD-10-CM

## 2016-11-22 ENCOUNTER — Encounter (HOSPITAL_COMMUNITY): Payer: Self-pay

## 2017-02-17 ENCOUNTER — Other Ambulatory Visit: Payer: Self-pay | Admitting: *Deleted

## 2017-02-17 DIAGNOSIS — E782 Mixed hyperlipidemia: Secondary | ICD-10-CM

## 2017-02-17 DIAGNOSIS — I10 Essential (primary) hypertension: Secondary | ICD-10-CM

## 2017-02-17 MED ORDER — HYDROCHLOROTHIAZIDE 25 MG PO TABS
25.0000 mg | ORAL_TABLET | Freq: Every day | ORAL | 0 refills | Status: DC
Start: 2017-02-17 — End: 2017-05-19

## 2017-02-17 MED ORDER — METOPROLOL SUCCINATE ER 50 MG PO TB24: 50.0000 mg | ORAL_TABLET | Freq: Every day | ORAL | 0 refills | Status: DC

## 2017-02-17 MED ORDER — ATORVASTATIN CALCIUM 40 MG PO TABS
40.0000 mg | ORAL_TABLET | Freq: Every day | ORAL | 0 refills | Status: DC
Start: 2017-02-17 — End: 2017-05-19

## 2017-04-03 ENCOUNTER — Encounter: Payer: Medicare HMO | Admitting: Family

## 2017-05-18 ENCOUNTER — Encounter: Payer: Self-pay | Admitting: Family

## 2017-05-18 ENCOUNTER — Ambulatory Visit (INDEPENDENT_AMBULATORY_CARE_PROVIDER_SITE_OTHER): Payer: Medicare HMO | Admitting: Family

## 2017-05-18 VITALS — BP 114/64 | HR 74 | Temp 97.1°F | Ht 64.0 in | Wt 185.4 lb

## 2017-05-18 DIAGNOSIS — E559 Vitamin D deficiency, unspecified: Secondary | ICD-10-CM

## 2017-05-18 DIAGNOSIS — Z0000 Encounter for general adult medical examination without abnormal findings: Secondary | ICD-10-CM | POA: Diagnosis not present

## 2017-05-18 DIAGNOSIS — Z23 Encounter for immunization: Secondary | ICD-10-CM

## 2017-05-18 DIAGNOSIS — E782 Mixed hyperlipidemia: Secondary | ICD-10-CM | POA: Diagnosis not present

## 2017-05-18 DIAGNOSIS — I10 Essential (primary) hypertension: Secondary | ICD-10-CM

## 2017-05-18 DIAGNOSIS — E669 Obesity, unspecified: Secondary | ICD-10-CM

## 2017-05-18 DIAGNOSIS — K219 Gastro-esophageal reflux disease without esophagitis: Secondary | ICD-10-CM

## 2017-05-18 NOTE — Progress Notes (Signed)
Subjective:    Patient ID: [name], female    DOB: Jul 25, 1950, 67 y.o.   MRN: [id number]  Chief Complaint  Patient presents with  . Annual Exam    Hypertension  This is a chronic problem. The current episode started more than 1 year ago. The problem has been resolved since onset. The problem is controlled. Pertinent negatives include no headaches, malaise/fatigue, peripheral edema or shortness of breath. Risk factors for coronary artery disease include dyslipidemia and sedentary lifestyle. The current treatment provides moderate improvement. There is no history of kidney disease, CAD/MI, CVA or heart failure.  Hyperlipidemia  This is a chronic problem. The current episode started more than 1 year ago. The problem is controlled. Recent lipid tests were reviewed and are normal. Exacerbating diseases include obesity. Pertinent negatives include no shortness of breath. Current antihyperlipidemic treatment includes statins. The current treatment provides moderate improvement of lipids. Risk factors for coronary artery disease include dyslipidemia, hypertension and a sedentary lifestyle.  Gastroesophageal Reflux  She reports no belching, no coughing or no heartburn. This is a chronic problem. The current episode started more than 1 year ago. The problem occurs occasionally. The problem has been waxing and waning. The symptoms are aggravated by certain foods. Risk factors include obesity. She has tried a PPI for the symptoms. The treatment provided moderate relief.      Review of Systems  Constitutional: Negative for malaise/fatigue.  Respiratory: Negative for cough and shortness of breath.   Gastrointestinal: Negative for heartburn.  Neurological: Negative for headaches.  All other systems reviewed and are negative.      Objective:   Physical Exam  Constitutional: She is oriented to person, place, and time. She appears well-developed and well-nourished. No distress.  HENT:  Head:  Normocephalic and atraumatic.  Right Ear: External ear normal.  Left Ear: External ear normal.  Mouth/Throat: Oropharynx is clear and moist.  Eyes: Pupils are equal, round, and reactive to light.  Neck: Normal range of motion. Neck supple. No thyromegaly present.  Cardiovascular: Normal rate, regular rhythm, normal heart sounds and intact distal pulses.  No murmur heard. Pulmonary/Chest: Effort normal and breath sounds normal. No respiratory distress. She has no wheezes.  Abdominal: Soft. Bowel sounds are normal. She exhibits no distension. There is no tenderness.  Musculoskeletal: Normal range of motion. She exhibits no edema or tenderness.  Neurological: She is alert and oriented to person, place, and time. She has normal reflexes.  Skin: Skin is warm and dry.  Psychiatric: She has a normal mood and affect. Her behavior is normal. Judgment and thought content normal.  Vitals reviewed.    BP 114/64   Pulse 74   Temp (!) 97.1 F (36.2 C) (Oral)   Ht '5\' 4"'  (1.626 m)   Wt 185 lb 6.4 oz (84.1 kg)   BMI 31.82 kg/m      Assessment & Plan:  Sue was seen today for annual exam.  Diagnoses and all orders for this visit:  Annual physical exam -     CBC with Differential/Platelet -     CMP14+EGFR -     Lipid panel -     TSH  Essential hypertension -     CBC with Differential/Platelet -     CMP14+EGFR  Gastroesophageal reflux disease, esophagitis presence not specified -     CBC with Differential/Platelet -     CMP14+EGFR  Mixed hyperlipidemia -     CBC with Differential/Platelet -     CMP14+EGFR -  Lipid panel  Obesity (BMI 30-39.9) -     CBC with Differential/Platelet -     CMP14+EGFR  Vitamin D deficiency -     CBC with Differential/Platelet -     CMP14+EGFR -     VITAMIN D 25 Hydroxy (Vit-D Deficiency, Fractures)   Continue all meds Labs pending Health Maintenance reviewed Diet and exercise encouraged RTO 6 months   Evelina Dun, FNP

## 2017-05-18 NOTE — Patient Instructions (Signed)

## 2017-05-18 NOTE — Addendum Note (Signed)
Addended by: Shelbie Ammons on: 05/18/2017 12:45 PM   Modules accepted: Orders

## 2017-05-19 ENCOUNTER — Other Ambulatory Visit: Payer: Self-pay | Admitting: Family

## 2017-05-19 DIAGNOSIS — E782 Mixed hyperlipidemia: Secondary | ICD-10-CM

## 2017-05-19 DIAGNOSIS — I10 Essential (primary) hypertension: Secondary | ICD-10-CM

## 2017-05-19 LAB — CBC WITH DIFFERENTIAL/PLATELET
Basophils Absolute: 0 x10E3/uL (ref 0.0–0.2)
Basos: 0 %
EOS (ABSOLUTE): 0.1 x10E3/uL (ref 0.0–0.4)
Eos: 1 %
Hematocrit: 39.1 % (ref 34.0–46.6)
Hemoglobin: 12.7 g/dL (ref 11.1–15.9)
Immature Grans (Abs): 0 x10E3/uL (ref 0.0–0.1)
Immature Granulocytes: 0 %
Lymphocytes Absolute: 3.7 x10E3/uL — ABNORMAL HIGH (ref 0.7–3.1)
Lymphs: 48 %
MCH: 27.9 pg (ref 26.6–33.0)
MCHC: 32.5 g/dL (ref 31.5–35.7)
MCV: 86 fL (ref 79–97)
Monocytes Absolute: 0.5 x10E3/uL (ref 0.1–0.9)
Monocytes: 6 %
Neutrophils Absolute: 3.5 x10E3/uL (ref 1.4–7.0)
Neutrophils: 45 %
Platelets: 383 x10E3/uL — ABNORMAL HIGH (ref 150–379)
RBC: 4.56 x10E6/uL (ref 3.77–5.28)
RDW: 16 % — ABNORMAL HIGH (ref 12.3–15.4)
WBC: 7.9 x10E3/uL (ref 3.4–10.8)

## 2017-05-19 LAB — CMP14+EGFR
ALK PHOS: 54 IU/L (ref 39–117)
ALT: 42 IU/L — AB (ref 0–32)
AST: 24 IU/L (ref 0–40)
Albumin/Globulin Ratio: 2.4 — ABNORMAL HIGH (ref 1.2–2.2)
Albumin: 4.7 g/dL (ref 3.6–4.8)
BUN/Creatinine Ratio: 18 (ref 12–28)
BUN: 15 mg/dL (ref 8–27)
Bilirubin Total: 0.4 mg/dL (ref 0.0–1.2)
CO2: 27 mmol/L (ref 20–29)
CREATININE: 0.85 mg/dL (ref 0.57–1.00)
Calcium: 9.8 mg/dL (ref 8.7–10.3)
Chloride: 100 mmol/L (ref 96–106)
GFR calc Af Amer: 83 mL/min/{1.73_m2} (ref >59)
GFR, EST NON AFRICAN AMERICAN: 72 mL/min/{1.73_m2} (ref >59)
Globulin, Total: 2 g/dL (ref 1.5–4.5)
Glucose: 109 mg/dL — ABNORMAL HIGH (ref 65–99)
POTASSIUM: 3.7 mmol/L (ref 3.5–5.2)
Sodium: 141 mmol/L (ref 134–144)
Total Protein: 6.7 g/dL (ref 6.0–8.5)

## 2017-05-19 LAB — LIPID PANEL
Chol/HDL Ratio: 2.6 ratio (ref 0.0–4.4)
Cholesterol, Total: 122 mg/dL (ref 100–199)
HDL: 47 mg/dL
LDL Calculated: 55 mg/dL (ref 0–99)
Triglycerides: 101 mg/dL (ref 0–149)
VLDL Cholesterol Cal: 20 mg/dL (ref 5–40)

## 2017-05-19 LAB — VITAMIN D 25 HYDROXY (VIT D DEFICIENCY, FRACTURES): Vit D, 25-Hydroxy: 59.4 ng/mL (ref 30.0–100.0)

## 2017-05-19 LAB — TSH: TSH: 2.26 u[IU]/mL (ref 0.450–4.500)

## 2017-05-19 MED ORDER — METOPROLOL SUCCINATE ER 50 MG PO TB24
50.0000 mg | ORAL_TABLET | Freq: Every day | ORAL | 1 refills | Status: DC
Start: 2017-05-19 — End: 2017-11-28

## 2017-05-19 MED ORDER — HYDROCHLOROTHIAZIDE 25 MG PO TABS: 25.0000 mg | ORAL_TABLET | Freq: Every day | ORAL | 1 refills | Status: DC

## 2017-05-19 MED ORDER — GABAPENTIN 100 MG PO CAPS: 100.0000 mg | ORAL_CAPSULE | Freq: Two times a day (BID) | ORAL | 1 refills | Status: DC

## 2017-05-19 MED ORDER — ATORVASTATIN CALCIUM 40 MG PO TABS: 40.0000 mg | ORAL_TABLET | Freq: Every day | ORAL | 1 refills | Status: DC

## 2017-05-19 NOTE — Telephone Encounter (Signed)
Pt aware refill sent to pharmacy 

## 2017-05-24 ENCOUNTER — Ambulatory Visit (INDEPENDENT_AMBULATORY_CARE_PROVIDER_SITE_OTHER): Payer: Medicare HMO

## 2017-05-24 VITALS — BP 120/65 | HR 74 | Ht 64.0 in | Wt 184.0 lb

## 2017-05-24 DIAGNOSIS — Z23 Encounter for immunization: Secondary | ICD-10-CM

## 2017-05-24 DIAGNOSIS — Z0000 Encounter for general adult medical examination without abnormal findings: Secondary | ICD-10-CM | POA: Diagnosis not present

## 2017-05-24 DIAGNOSIS — R739 Hyperglycemia, unspecified: Secondary | ICD-10-CM

## 2017-05-24 LAB — BAYER DCA HB A1C WAIVED: HB A1C: 6.3 % (ref <7.0)

## 2017-05-24 NOTE — Progress Notes (Addendum)
Subjective:   [name] is a 67 y.o. female who presents for an Initial Medicare Annual Wellness Visit. She is a pleasant lady who resides in [place]. She lives alone [place] farm there. She is a widow. Her husband passed away 30 years ago. 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 She has two daughters one that lives nearby in [place] and the other in [place]. She visits and talks with them on a regular basis. She has 4 grandchildren. She has recently aquired a cat since her daughters house is being redone after damage from a tree and the daughter needed a place for it to stay. She states that she is very independent and has had trouble with pain in both of her hips for quite some time. She saw an orthopedic quite some time ago and he suggested to get her set up for surgery and possible replacement. She has not went back for follow up. She states that she is too busy and can't handle the down time of recovery. She walks for exercise daily but is worried because of her hip pain and her living alone that she will fall or walk somewhere and be in too much pain to get back home. She had some questions today about recent lab work that she had done. I printed patient a copy and reviewed with her. She was concerned that her blood sugar was slightly elevated the last couple times she has been in for a visit. Went ahead and did a HgB A1C today and the result came back as 6.3. Counseled patient to keep an eye on sugar intake and also advised that I would discuss with her PCP. She verbalized understanding. Went ahead and updated her immunizations today with a shingrix vaccine. Patient tolerated well.   Review of Systems      Cardiac Risk Factors include: advanced age (>61men, >72 women);dyslipidemia;hypertension;obesity (BMI >30kg/m2);sedentary lifestyle     Objective:    Today's Vitals   05/24/17 1109 05/24/17 1110  BP:  120/65   Pulse: 74   Weight: 184 lb (83.5 kg)   Height: 5\' 4"  (1.626 m)   PainSc:  3    Body mass index is 31.58 kg/m.  Advanced Directives 05/24/2017 04/28/2014 04/16/2014  Does Patient Have a Medical Advance Directive? No;Yes No No  Does patient want to make changes to medical advance directive? No - Patient declined - -  Would patient like information on creating a medical advance directive? No - Patient declined No - patient declined information -   Patient states that she has a will but it does need to be updated and she is planning to do so.    Current Medications (verified) Outpatient Encounter Medications as of 05/24/2017  Medication Sig  . aspirin 81 MG tablet Take 162 mg by mouth daily.   Marland Kitchen atorvastatin (LIPITOR) 40 MG tablet Take 1 tablet (40 mg total) by mouth daily.  . Calcium Carbonate-Vitamin D (CALCIUM + D PO) Take 1 tablet by mouth daily.  Marland Kitchen gabapentin (NEURONTIN) 100 MG capsule Take 1 capsule (100 mg total) by mouth 2 (two) times daily.  . hydrochlorothiazide (HYDRODIURIL) 25 MG tablet Take 1 tablet (25 mg total) by mouth daily.  . metoprolol succinate (TOPROL-XL) 50 MG 24 hr tablet Take 1 tablet (50 mg total) by mouth daily.  . Multiple Vitamins-Minerals (HAIR/SKIN/NAILS PO) Take 1 tablet by mouth daily.  . Omega-3 Fatty  Acids (FISH OIL) 1200 MG CAPS Take 1,200 mg by mouth 2 (two) times daily.  . Vitamin D, Ergocalciferol, (DRISDOL) 50000 units CAPS capsule take 1 capsule by mouth every week   No facility-administered encounter medications on file as of 05/24/2017.     Allergies (verified) Patient has no known allergies.   History: Past Medical History:  Diagnosis Date  . Fibromyalgia   . GERD (gastroesophageal reflux disease)   . Hyperlipidemia   . Hypertension    Past Surgical History:  Procedure Laterality Date  . CARPAL TUNNEL RELEASE Bilateral   . CHOLECYSTECTOMY    . COLONOSCOPY  2001  . TUBAL LIGATION    . 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    . UPPER  GASTROINTESTINAL ENDOSCOPY     Family History  Problem Relation Age of Onset  . Hypertension Father   . Heart disease Father   . Cancer Father   . Hypertension Mother   . Heart disease Mother   . Diabetes Mother   . Hypertension Brother   . Hypertension Sister   . Colon cancer Neg Hx    Social History   Socioeconomic History  . Marital status: Widowed    Spouse name: Not on file  . Number of children: 2  . Years of education: Not on file  . Highest education level: High school graduate  Occupational History  . Not on file  Social Needs  . Financial resource strain: Not hard at all  . Food insecurity:    Worry: Never true    Inability: Never true  . Transportation needs:    Medical: No    Non-medical: No  Tobacco Use  . Smoking status: Never Smoker  . Smokeless tobacco: Never Used  Substance and Sexual Activity  . Alcohol use: No  . Drug use: No  . Sexual activity: Not Currently  Lifestyle  . Physical activity:    Days per week: 3 days    Minutes per session: 30 min  . Stress: Not at all  Relationships  . Social connections:    Talks on phone: More than three times a week    Gets together: More than three times a week    Attends religious service: Never    Active member of club or organization: No    Attends meetings of clubs or organizations: Never    Relationship status: Widowed  Other Topics Concern  . Not on file  Social History Narrative  . Not on file    Tobacco Counseling       Patient has never been a smoker   Clinical Intake:  Pre-visit preparation completed: No  Pain : 0-10 Pain Score: 3 (Has pain under left breast. Has been going on for 10 years. Has had it checked out and no one can find anything) Pain Type: Chronic pain Pain Location: Breast Pain Orientation: Left Pain Descriptors / Indicators: Sharp Pain Onset: More than a month ago Pain Frequency: Once a week     BMI - recorded: 31.81 Nutritional Status: BMI > 30   Obese Nutritional Risks: None Diabetes: No  How often do you need to have someone help you when you read instructions, pamphlets, or other written materials from your doctor or pharmacy?: 1 - Never What is the last grade level you completed in school?: High school graduate  Interpreter Needed?: No  Information entered by :: Theodoro Clock LPN   Activities of Daily Living In your present state of health, do you have any difficulty  performing the following activities: 05/24/2017  Hearing? N  Vision? Y  Comment Wears reading glasses  Difficulty concentrating or making decisions? N  Walking or climbing stairs? Y  Comment At times. Has pain in her hips  Dressing or bathing? N  Doing errands, shopping? N  Preparing Food and eating ? N  Using the Toilet? N  In the past six months, have you accidently leaked urine? N  Do you have problems with loss of bowel control? N  Managing your Medications? N  Managing your Finances? N  Housekeeping or managing your Housekeeping? N  Some recent data might be hidden     Immunizations and Health Maintenance Immunization History  Administered Date(s) Administered  . Influenza,inj,Quad PF,6+ Mos 01/15/2015, 11/12/2015, 10/25/2016  . Influenza-Unspecified 01/29/2014  . PPD Test 03/10/2014  . Pneumococcal Conjugate-13 11/12/2015  . Pneumococcal Polysaccharide-23 05/18/2017  . Tdap 03/10/2014  . Zoster 11/28/2012   Updated Shingrix today  There are no preventive care reminders to display for this patient.  Patient Care Team: Sharion Balloon, FNP as PCP - General (Nurse Practitioner) Mcarthur Rossetti, MD as Consulting Physician (Orthopedic Surgery)  Indicate any recent Medical Services you may have received from other than [facility] providers in the past year (date may be approximate).     Assessment:   This is a routine wellness examination for Denna.  Hearing/Vision screen No exam data present  Dietary issues and exercise  activities discussed: Current Exercise Habits: The patient does not participate in regular exercise at present, Exercise limited by: orthopedic condition(s)  Goals    . DIET - EAT MORE FRUITS AND VEGETABLES    . Exercise 150 min/wk Moderate Activity      Depression Screen PHQ 2/9 Scores 05/24/2017 05/18/2017 05/12/2016 02/21/2014  PHQ - 2 Score 0 0 0 0    Fall Risk Fall Risk  05/24/2017 05/18/2017 05/12/2016 02/21/2014  Falls in the past year? No No No No    Is the patient's home free of loose throw rugs in walkways, pet beds, electrical cords, etc?   yes      Grab bars in the bathroom? no      Handrails on the stairs?   yes      Adequate lighting?   yes    Cognitive Function: MMSE - Mini Mental State Exam 05/24/2017  Orientation to time 5  Orientation to Place 5  Registration 3  Attention/ Calculation 5  Recall 3  Language- name 2 objects 2  Language- repeat 1  Language- follow 3 step command 3  Language- read & follow direction 1  Write a sentence 1  Copy design 1  Total score 30        Screening Tests Health Maintenance  Topic Date Due  . INFLUENZA VACCINE  08/17/2017  . MAMMOGRAM  06/04/2018  . TETANUS/TDAP  03/10/2024  . COLONOSCOPY  04/27/2024  . DEXA SCAN  Completed  . Hepatitis C Screening  Completed  . PNA vac Low Risk Adult  Completed    Qualifies for Shingles Vaccine? Patient was given Shingrix today  Cancer Screenings: Lung: Low Dose CT Chest recommended if Age 63-80 years, 30 pack-year currently smoking OR have quit w/in 15years. Patient does not qualify. Breast: Up to date on Mammogram? Yes   Up to date of Bone Density/Dexa? Yes Colorectal: Patient is up to date  Additional Screenings:  Hepatitis C Screening:    Patient will have this drawn with next bloodwork     Plan:  I have personally reviewed and noted the following in the patient's chart:   . Medical and social history . Use of alcohol, tobacco or illicit drugs  . Current medications  and supplements . Functional ability and status . Nutritional status . Physical activity . Advanced directives . List of other physicians . Hospitalizations, surgeries, and ER visits in previous 12 months . Vitals . Screenings to include cognitive, depression, and falls . Referrals and appointments  In addition, I have reviewed and discussed with patient certain preventive protocols, quality metrics, and best practice recommendations. A written personalized care plan for preventive services as well as general preventive health recommendations were provided to patient.     Rolena Infante, LPN   04/17/4237    I have reviewed and agree with the above AWV documentation.   Evelina Dun, FNP

## 2017-05-24 NOTE — Patient Instructions (Signed)
  [name] , Thank you for taking time to come for your Medicare Wellness Visit. I appreciate your ongoing commitment to your health goals. Please review the following plan we discussed and let me know if I can assist you in the future.   These are the goals we discussed: Goals    . DIET - EAT MORE FRUITS AND VEGETABLES    . Exercise 150 min/wk Moderate Activity       This is a list of the screening recommended for you and due dates:  Health Maintenance  Topic Date Due  . Flu Shot  08/17/2017  . Mammogram  06/04/2018  . Tetanus Vaccine  03/10/2024  . Colon Cancer Screening  04/27/2024  . DEXA scan (bone density measurement)  Completed  .  Hepatitis C: One time screening is recommended by Center for Disease Control  (CDC) for  adults born from [birth year] through 1964.   Completed  . Pneumonia vaccines  Completed

## 2017-08-11 ENCOUNTER — Other Ambulatory Visit: Payer: Self-pay | Admitting: Physician Assistant

## 2017-08-11 DIAGNOSIS — Z1231 Encounter for screening mammogram for malignant neoplasm of breast: Secondary | ICD-10-CM

## 2017-08-14 ENCOUNTER — Ambulatory Visit: Payer: Medicare HMO

## 2017-08-14 ENCOUNTER — Ambulatory Visit: Payer: Medicare HMO | Admitting: *Deleted

## 2017-08-14 DIAGNOSIS — Z23 Encounter for immunization: Secondary | ICD-10-CM

## 2017-08-14 NOTE — Progress Notes (Signed)
Pt given 2nd Shingrix vaccine Tolerated well 

## 2017-09-04 ENCOUNTER — Ambulatory Visit: Payer: Medicare HMO

## 2017-09-29 ENCOUNTER — Ambulatory Visit
Admission: RE | Admit: 2017-09-29 | Discharge: 2017-09-29 | Disposition: A | Discharge status: Home / Self Care | Payer: Medicare HMO | Source: Ambulatory Visit | Attending: Physician Assistant | Admitting: Physician Assistant

## 2017-09-29 DIAGNOSIS — Z1231 Encounter for screening mammogram for malignant neoplasm of breast: Secondary | ICD-10-CM

## 2017-11-08 DIAGNOSIS — H5200 Hypermetropia, unspecified eye: Secondary | ICD-10-CM | POA: Diagnosis not present

## 2017-11-10 DIAGNOSIS — R69 Illness, unspecified: Secondary | ICD-10-CM | POA: Diagnosis not present

## 2017-11-28 ENCOUNTER — Other Ambulatory Visit: Payer: Self-pay | Admitting: Family

## 2017-11-28 DIAGNOSIS — E782 Mixed hyperlipidemia: Secondary | ICD-10-CM

## 2017-11-28 DIAGNOSIS — I10 Essential (primary) hypertension: Secondary | ICD-10-CM

## 2017-11-30 ENCOUNTER — Other Ambulatory Visit: Payer: Self-pay | Admitting: Family

## 2017-11-30 DIAGNOSIS — I10 Essential (primary) hypertension: Secondary | ICD-10-CM

## 2018-01-01 ENCOUNTER — Encounter: Payer: Self-pay | Admitting: Family

## 2018-01-01 ENCOUNTER — Ambulatory Visit (INDEPENDENT_AMBULATORY_CARE_PROVIDER_SITE_OTHER): Payer: Medicare HMO | Admitting: Family

## 2018-01-01 VITALS — BP 140/77 | HR 71 | Temp 97.2°F | Ht 64.0 in | Wt 191.2 lb

## 2018-01-01 DIAGNOSIS — E559 Vitamin D deficiency, unspecified: Secondary | ICD-10-CM

## 2018-01-01 DIAGNOSIS — E8881 Metabolic syndrome: Secondary | ICD-10-CM

## 2018-01-01 DIAGNOSIS — K219 Gastro-esophageal reflux disease without esophagitis: Secondary | ICD-10-CM | POA: Diagnosis not present

## 2018-01-01 DIAGNOSIS — E669 Obesity, unspecified: Secondary | ICD-10-CM

## 2018-01-01 DIAGNOSIS — E782 Mixed hyperlipidemia: Secondary | ICD-10-CM

## 2018-01-01 DIAGNOSIS — I10 Essential (primary) hypertension: Secondary | ICD-10-CM | POA: Diagnosis not present

## 2018-01-01 LAB — CMP14+EGFR
A/G RATIO: 1.9 (ref 1.2–2.2)
ALT: 27 IU/L (ref 0–32)
AST: 24 IU/L (ref 0–40)
Albumin: 4.5 g/dL (ref 3.6–4.8)
Alkaline Phosphatase: 60 IU/L (ref 39–117)
BILIRUBIN TOTAL: 0.5 mg/dL (ref 0.0–1.2)
BUN/Creatinine Ratio: 11 — ABNORMAL LOW (ref 12–28)
BUN: 10 mg/dL (ref 8–27)
CHLORIDE: 100 mmol/L (ref 96–106)
CO2: 24 mmol/L (ref 20–29)
Calcium: 9.7 mg/dL (ref 8.7–10.3)
Creatinine, Ser: 0.88 mg/dL (ref 0.57–1.00)
GFR calc Af Amer: 79 mL/min/{1.73_m2} (ref >59)
GFR calc non Af Amer: 69 mL/min/{1.73_m2} (ref >59)
GLOBULIN, TOTAL: 2.4 g/dL (ref 1.5–4.5)
Glucose: 112 mg/dL — ABNORMAL HIGH (ref 65–99)
POTASSIUM: 4.3 mmol/L (ref 3.5–5.2)
SODIUM: 142 mmol/L (ref 134–144)
Total Protein: 6.9 g/dL (ref 6.0–8.5)

## 2018-01-01 LAB — CBC WITH DIFFERENTIAL/PLATELET
Basophils Absolute: 0.1 10*3/uL (ref 0.0–0.2)
Basos: 1 %
EOS (ABSOLUTE): 0.2 10*3/uL (ref 0.0–0.4)
Eos: 2 %
HEMATOCRIT: 39.2 % (ref 34.0–46.6)
HEMOGLOBIN: 13.4 g/dL (ref 11.1–15.9)
Immature Grans (Abs): 0 10*3/uL (ref 0.0–0.1)
Immature Granulocytes: 0 %
LYMPHS ABS: 4.3 10*3/uL — AB (ref 0.7–3.1)
LYMPHS: 45 %
MCH: 29.1 pg (ref 26.6–33.0)
MCHC: 34.2 g/dL (ref 31.5–35.7)
MCV: 85 fL (ref 79–97)
Monocytes Absolute: 0.6 10*3/uL (ref 0.1–0.9)
Monocytes: 6 %
NEUTROS PCT: 46 %
Neutrophils Absolute: 4.2 10*3/uL (ref 1.4–7.0)
Platelets: 400 10*3/uL (ref 150–450)
RBC: 4.6 x10E6/uL (ref 3.77–5.28)
RDW: 13.8 % (ref 12.3–15.4)
WBC: 9.3 10*3/uL (ref 3.4–10.8)

## 2018-01-01 LAB — LIPID PANEL
CHOL/HDL RATIO: 2.8 ratio (ref 0.0–4.4)
Cholesterol, Total: 152 mg/dL (ref 100–199)
HDL: 55 mg/dL (ref >39)
LDL CALC: 72 mg/dL (ref 0–99)
Triglycerides: 123 mg/dL (ref 0–149)
VLDL Cholesterol Cal: 25 mg/dL (ref 5–40)

## 2018-01-01 MED ORDER — ATORVASTATIN CALCIUM 40 MG PO TABS: 40.0000 mg | ORAL_TABLET | Freq: Every day | ORAL | 3 refills | Status: DC

## 2018-01-01 MED ORDER — GABAPENTIN 100 MG PO CAPS: 100.0000 mg | ORAL_CAPSULE | Freq: Two times a day (BID) | ORAL | 3 refills | Status: DC

## 2018-01-01 MED ORDER — METOPROLOL SUCCINATE ER 50 MG PO TB24: 50.0000 mg | ORAL_TABLET | Freq: Every day | ORAL | 3 refills | Status: DC

## 2018-01-01 MED ORDER — HYDROCHLOROTHIAZIDE 25 MG PO TABS: 25.0000 mg | ORAL_TABLET | Freq: Every day | ORAL | 3 refills | Status: DC

## 2018-01-01 NOTE — Progress Notes (Signed)
Subjective:    Patient ID: [name], female    DOB: Apr 07, 1950, 67 y.o.   MRN: [id number]  Chief Complaint  Patient presents with  . Medical Management of Chronic Issues    six monthh recheck   Pt presents to the office today for chronic follow up. PT has bilateral hip pain and has seen Ortho in past and was told she needed hip replacements.  Hypertension  This is a chronic problem. The current episode started more than 1 year ago. The problem has been waxing and waning since onset. The problem is controlled. Associated symptoms include shortness of breath ("slightly"). Pertinent negatives include no headaches, malaise/fatigue or peripheral edema. Risk factors for coronary artery disease include obesity and sedentary lifestyle. The current treatment provides moderate improvement. There is no history of CAD/MI, CVA or heart failure.  Gastroesophageal Reflux  She complains of heartburn. She reports no belching, no coughing or no water brash. This is a chronic problem. The current episode started more than 1 year ago. The problem occurs occasionally. She has tried a PPI for the symptoms. The treatment provided moderate relief.  Arthritis  Presents for follow-up visit. She complains of pain and stiffness. Affected locations include the right knee, left knee, right MCP, left MCP, right hip and left hip. Her pain is at a severity of 8/10.  Hyperlipidemia  This is a chronic problem. The current episode started more than 1 year ago. The problem is controlled. Recent lipid tests were reviewed and are normal. Associated symptoms include shortness of breath ("slightly"). Current antihyperlipidemic treatment includes statins. The current treatment provides moderate improvement of lipids. Risk factors for coronary artery disease include diabetes mellitus, dyslipidemia, hypertension and a sedentary lifestyle.  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 She states she eats what she wants.     Review of Systems  Constitutional: Negative for malaise/fatigue.  Respiratory: Positive for shortness of breath ("slightly"). Negative for cough.   Gastrointestinal: Positive for heartburn.  Musculoskeletal: Positive for arthritis and stiffness.  Neurological: Negative for headaches.  All other systems reviewed and are negative.      Objective:   Physical Exam Vitals signs reviewed.  Constitutional:      General: She is not in acute distress.    Appearance: She is well-developed.  HENT:     Head: Normocephalic and atraumatic.     Right Ear: External ear normal.  Eyes:     Pupils: Pupils are equal, round, and reactive to light.  Neck:     Musculoskeletal: Normal range of motion and neck supple.     Thyroid: No thyromegaly.  Cardiovascular:     Rate and Rhythm: Normal rate and regular rhythm.     Heart sounds: Normal heart sounds. No murmur.  Pulmonary:     Effort: Pulmonary effort is normal. No respiratory distress.     Breath sounds: Normal breath sounds. No wheezing.  Abdominal:     General: Bowel sounds are normal. There is no distension.     Palpations: Abdomen is soft.     Tenderness: There is no abdominal tenderness.  Musculoskeletal: Normal range of motion.        General: No tenderness.  Skin:    General: Skin is warm and dry.  Neurological:     Mental Status: She is alert and oriented to person, place, and time.     Cranial Nerves: No cranial  nerve deficit.     Deep Tendon Reflexes: Reflexes are normal and symmetric.  Psychiatric:        Behavior: Behavior normal.        Thought Content: Thought content normal.        Judgment: Judgment normal.       BP 140/77   Pulse 71   Temp (!) 97.2 F (36.2 C) (Oral)   Ht _0  (1.626 m)   Wt 191 lb 3.2 oz (86.7 kg)   BMI 32.82 kg/m      Assessment & Plan:  MARDA BREIDENBACH comes in today with chief complaint of Medical Management of  Chronic Issues (six monthh recheck)   Diagnosis and orders addressed:  1. Essential hypertension - CMP14+EGFR - CBC with Differential/Platelet - hydrochlorothiazide (HYDRODIURIL) 25 MG tablet; Take 1 tablet (25 mg total) by mouth daily. (Needs to be seen before next refill)  Dispense: 90 tablet; Refill: 3 - metoprolol succinate (TOPROL-XL) 50 MG 24 hr tablet; Take 1 tablet (50 mg total) by mouth daily. (Needs to be seen before next refill)  Dispense: 90 tablet; Refill: 3  2. Gastroesophageal reflux disease, esophagitis presence not specified - CMP14+EGFR - CBC with Differential/Platelet  3. Mixed hyperlipidemia - CMP14+EGFR - CBC with Differential/Platelet - Lipid panel - atorvastatin (LIPITOR) 40 MG tablet; Take 1 tablet (40 mg total) by mouth daily. (Needs to be seen before next refill)  Dispense: 90 tablet; Refill: 3  4. Obesity (BMI 30-39.9) - CMP14+EGFR - CBC with Differential/Platelet  5. Vitamin D deficiency - CMP14+EGFR - CBC with Differential/Platelet  6. Metabolic syndrome - OKH99+HFSF - CBC with Differential/Platelet   Labs pending Health Maintenance reviewed Diet and exercise encouraged  Follow up plan: 6 months   Evelina Dun, FNP

## 2018-01-01 NOTE — Patient Instructions (Signed)

## 2018-02-15 ENCOUNTER — Encounter: Payer: Self-pay | Admitting: Physician Assistant

## 2018-02-15 ENCOUNTER — Ambulatory Visit (INDEPENDENT_AMBULATORY_CARE_PROVIDER_SITE_OTHER): Payer: Medicare HMO | Admitting: Physician Assistant

## 2018-02-15 VITALS — BP 134/71 | HR 86 | Temp 97.4°F | Ht 64.0 in | Wt 190.0 lb

## 2018-02-15 DIAGNOSIS — M9901 Segmental and somatic dysfunction of cervical region: Secondary | ICD-10-CM | POA: Diagnosis not present

## 2018-02-15 MED ORDER — METHOCARBAMOL 500 MG PO TABS: 500.0000 mg | ORAL_TABLET | Freq: Three times a day (TID) | ORAL | 1 refills | Status: DC

## 2018-02-15 MED ORDER — METHYLPREDNISOLONE ACETATE 80 MG/ML IJ SUSP
80.0000 mg | Freq: Once | INTRAMUSCULAR | Status: AC
  Administered 2018-02-15: 80 mg via INTRAMUSCULAR

## 2018-02-18 NOTE — Progress Notes (Signed)
BP 134/71   Pulse 86   Temp (!) 97.4 F (36.3 C) (Oral)   Ht 5\' 4"  (1.626 m)   Wt 190 lb (86.2 kg)   BMI 32.61 kg/m    Subjective:    Patient ID: [name], female    DOB: 09/16/1950, 68 y.o.   MRN: [id number]  HPI: [name] is a 68 y.o. female presenting on 02/15/2018 for Shoulder Pain (right)  She states that she has had significant pain in her neck and shoulder for about 2 days.  She has had no known injury.  She does not recall ever having this before.  She is tried some over-the-counter medications without much relief.  Past Medical History:  Diagnosis Date  . Fibromyalgia   . GERD (gastroesophageal reflux disease)   . Hyperlipidemia   . Hypertension    Relevant past medical, surgical, family and social history reviewed and updated as indicated. Interim medical history since our last visit reviewed. Allergies and medications reviewed and updated. DATA REVIEWED: CHART IN EPIC  Family History reviewed for pertinent findings.  Review of Systems  Constitutional: Negative.   HENT: Negative.   Eyes: Negative.   Respiratory: Negative.   Gastrointestinal: Negative.   Genitourinary: Negative.   Musculoskeletal: Positive for arthralgias, myalgias, neck pain and neck stiffness.    Allergies as of 02/15/2018   No Known Allergies     Medication List       Accurate as of February 15, 2018 11:59 PM. Always use your most recent med list.        aspirin 81 MG tablet Take 162 mg by mouth daily.   atorvastatin 40 MG tablet Commonly known as:  LIPITOR Take 1 tablet (40 mg total) by mouth daily. (Needs to be seen before next refill)   CALCIUM + D PO Take 1 tablet by mouth daily.   Fish Oil 1200 MG Caps Take 1,200 mg by mouth 2 (two) times daily.   gabapentin 100 MG capsule Commonly known as:  NEURONTIN Take 1 capsule (100 mg total) by mouth 2 (two) times daily.   HAIR/SKIN/NAILS PO Take 1 tablet by mouth daily.   hydrochlorothiazide 25 MG  tablet Commonly known as:  HYDRODIURIL Take 1 tablet (25 mg total) by mouth daily. (Needs to be seen before next refill)   methocarbamol 500 MG tablet Commonly known as:  ROBAXIN Take 1 tablet (500 mg total) by mouth 3 (three) times daily.   metoprolol succinate 50 MG 24 hr tablet Commonly known as:  TOPROL-XL Take 1 tablet (50 mg total) by mouth daily. (Needs to be seen before next refill)          Objective:    BP 134/71   Pulse 86   Temp (!) 97.4 F (36.3 C) (Oral)   Ht 5\' 4"  (1.626 m)   Wt 190 lb (86.2 kg)   BMI 32.61 kg/m   No Known Allergies  Wt Readings from Last 3 Encounters:  02/15/18 190 lb (86.2 kg)  01/01/18 191 lb 3.2 oz (86.7 kg)  05/24/17 184 lb (83.5 kg)    Physical Exam Constitutional:      Appearance: She is well-developed.  HENT:     Head: Normocephalic and atraumatic.  Eyes:     Conjunctiva/sclera: Conjunctivae normal.     Pupils: Pupils are equal, round, and reactive to light.  Cardiovascular:     Rate and Rhythm: Normal rate and regular rhythm.     Heart sounds: Normal heart sounds.  Pulmonary:     Effort: Pulmonary effort is normal.     Breath sounds: Normal breath sounds.  Abdominal:     General: Bowel sounds are normal.     Palpations: Abdomen is soft.  Musculoskeletal:     Cervical back: She exhibits decreased range of motion, tenderness, pain and spasm.       Back:  Skin:    General: Skin is warm and dry.     Findings: No rash.  Neurological:     Mental Status: She is alert and oriented to person, place, and time.     Deep Tendon Reflexes: Reflexes are normal and symmetric.  Psychiatric:        Behavior: Behavior normal.        Thought Content: Thought content normal.        Judgment: Judgment normal.         Assessment & Plan:   1. Cervical somatic dysfunction - methylPREDNISolone acetate (DEPO-MEDROL) injection 80 mg - methocarbamol (ROBAXIN) 500 MG tablet; Take 1 tablet (500 mg total) by mouth 3 (three) times  daily.  Dispense: 40 tablet; Refill: 1   Continue all other maintenance medications as listed above.  Follow up plan: No follow-ups on file.  Educational handout given for [place] PA-C [place] [address]  [place], [place] [address] [phone]   02/18/2018, 9:15 PM

## 2018-03-13 ENCOUNTER — Ambulatory Visit: Payer: Medicare HMO | Admitting: Family Medicine

## 2018-03-16 ENCOUNTER — Ambulatory Visit: Payer: Medicare HMO | Admitting: Family

## 2018-03-17 DIAGNOSIS — M5136 Other intervertebral disc degeneration, lumbar region: Secondary | ICD-10-CM | POA: Diagnosis not present

## 2018-03-17 DIAGNOSIS — M25559 Pain in unspecified hip: Secondary | ICD-10-CM | POA: Diagnosis not present

## 2018-03-17 DIAGNOSIS — M545 Low back pain, unspecified: Secondary | ICD-10-CM | POA: Insufficient documentation

## 2018-04-24 ENCOUNTER — Institutional Professional Consult (permissible substitution): Payer: Medicare HMO | Admitting: Plastic Surgery

## 2018-05-31 ENCOUNTER — Telehealth: Payer: Self-pay | Admitting: Plastic Surgery

## 2018-05-31 NOTE — Telephone Encounter (Signed)
Called patient to confirm appointment scheduled for tomorrow. Patient answered the following questions: °1.Has the patient traveled outside of the state of [place] at all within the past 6 weeks? No °2.Does the patient have a fever or cough at all? No °3.Has the patient been tested for COVID? Had a positive COVID test? No °4. Has the patient been in contact with anyone who has tested positive? No ° °

## 2018-06-01 ENCOUNTER — Ambulatory Visit: Payer: Medicare HMO | Admitting: Plastic Surgery

## 2018-06-01 ENCOUNTER — Encounter: Payer: Self-pay | Admitting: Plastic Surgery

## 2018-06-01 ENCOUNTER — Other Ambulatory Visit: Payer: Self-pay

## 2018-06-01 VITALS — BP 156/78 | HR 80 | Temp 98.3°F | Ht 64.0 in | Wt 193.0 lb

## 2018-06-01 DIAGNOSIS — M546 Pain in thoracic spine: Secondary | ICD-10-CM | POA: Diagnosis not present

## 2018-06-01 DIAGNOSIS — M542 Cervicalgia: Secondary | ICD-10-CM | POA: Diagnosis not present

## 2018-06-01 DIAGNOSIS — N62 Hypertrophy of breast: Secondary | ICD-10-CM | POA: Diagnosis not present

## 2018-06-01 DIAGNOSIS — G8929 Other chronic pain: Secondary | ICD-10-CM | POA: Diagnosis not present

## 2018-06-01 NOTE — Progress Notes (Signed)
Patient ID: [name], female    DOB: [birth year], 68 y.o.   MRN: [id number]   Chief Complaint  Patient presents with  . Breast Problem    Mammary Hyperplasia: The patient was referred by orthospine. The patient is a 68 y.o. female with a history of mammary hyperplasia for several years.  She has extremely large breasts causing symptoms that include the following: Back pain (upper and lower) and neck pain. She frequently pins bra cups higher on straps for better lift and relief. Notices relief when holding breast up in her hands. Shoulder straps causing grooves, pain occasionally requiring padding. Pain medication is sometimes required with motrin and tylenol.  Activities that are hindered by enlarged breasts include: running and exercise and simple activities around the house.  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  Her breasts are extremely large and fairly symmetric.  She has hyperpigmentation of the inframammary area on both sides.  The sternal to nipple distance on the right is 30 cm and the left is 29 cm.  She is 5 feet 4 inches tall and weighs 193 pounds.  Preoperative bra size = 42 DDD cup.  The estimated excess breast tissue to be removed at the time of surgery = 800 grams on the left and 800 grams on the right.  Mammogram history: within the past year and was negative.  We will need the results.  She is even walking and standing with an anterior tilt due to the pain and weight.  The patient has had anesthesia or sedation in the past.   The patient has not had problems with anesthesia  She has a family history of breast cancer - maternal aunt Her other medical history is remarkable for chronic back pain, hyperlipidemia Her surgical history is remarkable for back surgery.   Review of Systems  Constitutional: Positive for activity change. Negative for appetite change.  HENT: Negative.   Eyes: Negative.   Respiratory: Negative for chest  tightness and shortness of breath.   Cardiovascular: Negative.   Gastrointestinal: Negative.  Negative for abdominal distention and abdominal pain.  Endocrine: Negative.   Genitourinary: Negative.   Musculoskeletal: Positive for back pain and neck pain.  Skin: Negative for wound.  Psychiatric/Behavioral: Negative.     Past Medical History:  Diagnosis Date  . Fibromyalgia   . GERD (gastroesophageal reflux disease)   . Hyperlipidemia   . Hypertension     Past Surgical History:  Procedure Laterality Date  . CARPAL TUNNEL RELEASE Bilateral   . CHOLECYSTECTOMY    . COLONOSCOPY  2001  . TUBAL LIGATION    . 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    . UPPER GASTROINTESTINAL ENDOSCOPY        Current Outpatient Medications:  .  aspirin 81 MG tablet, Take 162 mg by mouth daily. , Disp: , Rfl:  .  atorvastatin (LIPITOR) 40 MG tablet, Take 1 tablet (40 mg total) by mouth daily. (Needs to be seen before next refill), Disp: 90 tablet, Rfl: 3 .  Calcium Carbonate-Vitamin D (CALCIUM + D PO), Take 1 tablet by mouth daily., Disp: , Rfl:  .  gabapentin (NEURONTIN) 100 MG capsule, Take 1 capsule (100 mg total) by mouth 2 (two) times daily., Disp: 180 capsule, Rfl: 3 .  hydrochlorothiazide (HYDRODIURIL) 25 MG tablet, Take 1 tablet (25 mg total) by mouth daily. (Needs to be seen before next refill), Disp: 90 tablet, Rfl: 3 .  methocarbamol (ROBAXIN) 500  MG tablet, Take 1 tablet (500 mg total) by mouth 3 (three) times daily. (Patient not taking: Reported on 06/01/2018), Disp: 40 tablet, Rfl: 1 .  metoprolol succinate (TOPROL-XL) 50 MG 24 hr tablet, Take 1 tablet (50 mg total) by mouth daily. (Needs to be seen before next refill), Disp: 90 tablet, Rfl: 3 .  Multiple Vitamins-Minerals (HAIR/SKIN/NAILS PO), Take 1 tablet by mouth daily., Disp: , Rfl:  .  Omega-3 Fatty Acids (FISH OIL) 1200 MG CAPS, Take 1,200 mg by mouth 2 (two) times daily., Disp: , Rfl:    Objective:   Vitals:   06/01/18 1054  BP: (!) 156/78   Pulse: 80  Temp: 98.3 F (36.8 C)  SpO2: 98%    Physical Exam Vitals signs and nursing note reviewed.  Constitutional:      Appearance: Normal appearance.  HENT:     Head: Normocephalic and atraumatic.     Nose: Nose normal.     Mouth/Throat:     Mouth: Mucous membranes are moist.  Cardiovascular:     Rate and Rhythm: Normal rate.  Pulmonary:     Effort: Pulmonary effort is normal. No respiratory distress.     Breath sounds: No wheezing.  Abdominal:     General: Abdomen is flat. There is no distension.     Tenderness: There is no abdominal tenderness.  Neurological:     General: No focal deficit present.     Mental Status: She is alert and oriented to person, place, and time.  Psychiatric:        Mood and Affect: Mood normal.        Behavior: Behavior normal.     Assessment & Plan:  Neck pain  Chronic bilateral thoracic back pain  Symptomatic mammary hypertrophy Recommend bilateral breast reduction.  Will need copy of mammogram prior to surgery. Pictures taken and placed in the chart with the patient permission.  [place], DO

## 2018-07-03 ENCOUNTER — Encounter: Payer: Self-pay | Admitting: Family

## 2018-07-03 ENCOUNTER — Ambulatory Visit (INDEPENDENT_AMBULATORY_CARE_PROVIDER_SITE_OTHER): Payer: Medicare HMO | Admitting: Family

## 2018-07-03 DIAGNOSIS — E782 Mixed hyperlipidemia: Secondary | ICD-10-CM | POA: Diagnosis not present

## 2018-07-03 DIAGNOSIS — E669 Obesity, unspecified: Secondary | ICD-10-CM

## 2018-07-03 DIAGNOSIS — M1712 Unilateral primary osteoarthritis, left knee: Secondary | ICD-10-CM

## 2018-07-03 DIAGNOSIS — M546 Pain in thoracic spine: Secondary | ICD-10-CM

## 2018-07-03 DIAGNOSIS — E8881 Metabolic syndrome: Secondary | ICD-10-CM

## 2018-07-03 DIAGNOSIS — G8929 Other chronic pain: Secondary | ICD-10-CM | POA: Diagnosis not present

## 2018-07-03 DIAGNOSIS — M1611 Unilateral primary osteoarthritis, right hip: Secondary | ICD-10-CM | POA: Diagnosis not present

## 2018-07-03 DIAGNOSIS — I10 Essential (primary) hypertension: Secondary | ICD-10-CM | POA: Diagnosis not present

## 2018-07-03 DIAGNOSIS — K219 Gastro-esophageal reflux disease without esophagitis: Secondary | ICD-10-CM

## 2018-07-03 MED ORDER — ATORVASTATIN CALCIUM 40 MG PO TABS: 40.0000 mg | ORAL_TABLET | Freq: Every day | ORAL | 3 refills | Status: DC

## 2018-07-03 MED ORDER — METOPROLOL SUCCINATE ER 50 MG PO TB24: 50.0000 mg | ORAL_TABLET | Freq: Every day | ORAL | 3 refills | Status: DC

## 2018-07-03 MED ORDER — HYDROCHLOROTHIAZIDE 25 MG PO TABS: 25.0000 mg | ORAL_TABLET | Freq: Every day | ORAL | 3 refills | Status: DC

## 2018-07-03 MED ORDER — GABAPENTIN 100 MG PO CAPS: 100.0000 mg | ORAL_CAPSULE | Freq: Two times a day (BID) | ORAL | 3 refills | Status: DC

## 2018-07-03 NOTE — Progress Notes (Signed)
Virtual Visit via telephone Note  I connected with [name] on 07/03/18 at 10:40 AM by telephone and verified that I am speaking with the correct person using two identifiers. [name] is currently located at home and no one is currently with her during visit. The provider, Evelina Dun, FNP is located in their office at time of visit.  I discussed the limitations, risks, security and privacy concerns of performing an evaluation and management service by telephone and the availability of in person appointments. I also discussed with the patient that there may be a patient responsible charge related to this service. The patient expressed understanding and agreed to proceed.   History and Present Illness:  Pt presents to the office today for chronic follow up. PT has bilateral hip pain and has seen Ortho in past and was told she needed hip replacements. This was postpone related to COVID. She is scheduled for breast reduction 08/09/18 to hopefully help with her back pain.  Hypertension This is a chronic problem. The current episode started more than 1 year ago. The problem has been resolved since onset. The problem is controlled. Pertinent negatives include no headaches, malaise/fatigue, peripheral edema or shortness of breath. Risk factors for coronary artery disease include dyslipidemia, family history and sedentary lifestyle. The current treatment provides moderate improvement. There is no history of kidney disease, CVA or heart failure.  Hyperlipidemia This is a chronic problem. The current episode started more than 1 year ago. The problem is controlled. Recent lipid tests were reviewed and are normal. Pertinent negatives include no shortness of breath. Current antihyperlipidemic treatment includes statins. The current treatment provides moderate improvement of lipids. Risk factors for coronary artery disease include dyslipidemia, hypertension, a sedentary lifestyle and  post-menopausal.  Arthritis Presents for follow-up visit. She complains of pain and stiffness. The symptoms have been stable. Affected locations include the right hip and left hip (back). Her pain is at a severity of 8/10.  Metabolic Syndrome  PT states she is unable to exercise like she should because of pain in her back and hips. She does try to eat healthy.     Review of Systems  Constitutional: Negative for malaise/fatigue.  Respiratory: Negative for shortness of breath.   Musculoskeletal: Positive for arthritis and stiffness.  Neurological: Negative for headaches.     Observations/Objective: No SOB or distress   Assessment and Plan: 1. Essential hypertension - hydrochlorothiazide (HYDRODIURIL) 25 MG tablet; Take 1 tablet (25 mg total) by mouth daily. (Needs to be seen before next refill)  Dispense: 90 tablet; Refill: 3 - metoprolol succinate (TOPROL-XL) 50 MG 24 hr tablet; Take 1 tablet (50 mg total) by mouth daily. (Needs to be seen before next refill)  Dispense: 90 tablet; Refill: 3  2. Gastroesophageal reflux disease, esophagitis presence not specified  3. Mixed hyperlipidemia - atorvastatin (LIPITOR) 40 MG tablet; Take 1 tablet (40 mg total) by mouth daily. (Needs to be seen before next refill)  Dispense: 90 tablet; Refill: 3  4. Chronic bilateral thoracic back pain  5. Metabolic syndrome - atorvastatin (LIPITOR) 40 MG tablet; Take 1 tablet (40 mg total) by mouth daily. (Needs to be seen before next refill)  Dispense: 90 tablet; Refill: 3  6. Obesity (BMI 30-39.9)  7. Unilateral primary osteoarthritis, right hip  8. Unilateral primary osteoarthritis, left knee   Keep Ortho appt  Keep Breast reduction surgery Continue medications  RTO in 3 months for follow up    I discussed the assessment  and treatment plan with the patient. The patient was provided an opportunity to ask questions and all were answered. The patient agreed with the plan and demonstrated an  understanding of the instructions.   The patient was advised to call back or seek an in-person evaluation if the symptoms worsen or if the condition fails to improve as anticipated.  The above assessment and management plan was discussed with the patient. The patient verbalized understanding of and has agreed to the management plan. Patient is aware to call the clinic if symptoms persist or worsen. Patient is aware when to return to the clinic for a follow-up visit. Patient educated on when it is appropriate to go to the emergency department.   Time call ended:  10:54 AM  I provided 14 minutes of non-face-to-face time during this encounter.    Evelina Dun, FNP

## 2018-07-09 DIAGNOSIS — M25552 Pain in left hip: Secondary | ICD-10-CM | POA: Diagnosis not present

## 2018-07-09 DIAGNOSIS — M25551 Pain in right hip: Secondary | ICD-10-CM | POA: Diagnosis not present

## 2018-07-26 ENCOUNTER — Telehealth: Payer: Self-pay | Admitting: Plastic Surgery

## 2018-07-26 NOTE — Telephone Encounter (Signed)

## 2018-07-26 NOTE — H&P (View-Only) (Signed)
Patient ID: [name], female    DOB: [birth year], 68 y.o.   MRN: [id number]    ICD-10-CM   1. Neck pain  M54.2   2. Symptomatic mammary hypertrophy  N62   3. Mass of left side of neck  R22.1      History of Present Illness: [name] is a 68 y.o.  female  with a history of mammary hyperplasia causing symptoms such as neck and back pain (upper and lower).  She presents for preoperative evaluation for upcoming procedure, bilateral breast reduction. Her procedure is scheduled for 08/09/18 with Dr. Marla Roe.   Her sternal to nipple distance on the right is 30cm and on the left, 29cm. She currently weighs190lbs and is 5 ft 4 inches tall. The estimated excess breast tissue to be removed in surgery is 800 grams on the right and 800 grams on the left.  She is currently feeling well.  She reports no use of any anticoagulants, no cardiac disease, no issues with anesthesia or family history of anesthesia issues.  She would like to be a size C.  She does endorse that she has noticed a mass on her left neck/shoulder area.  It appears to be about 5 x 4 mm in size.  It feels consistent with a lipoma.  May be able to biopsy or remove at the time of her breast reduction surgery.  She does note that she has noticed some swelling in her feet, she does not report any shortness of breath with activity, or shortness of breath with sleeping.  She has no history of cardiac disease other than high blood pressure for which she takes HCTZ and metoprolol.  She reports that she is going to see her PCP on July 16 and will speak to her about this.  She will also ask about clearance for surgery.   Past Medical History: Allergies: No Known Allergies  Current Medications:  Current Outpatient Medications:  .  Ascorbic Acid (VITAMIN C) 1000 MG tablet, Take 1 tablet by mouth daily., Disp: , Rfl:  .  aspirin 81 MG tablet, Take 162 mg by mouth daily. , Disp: , Rfl:  .  atorvastatin (LIPITOR) 40 MG tablet,  Take 1 tablet (40 mg total) by mouth daily. (Needs to be seen before next refill), Disp: 90 tablet, Rfl: 3 .  gabapentin (NEURONTIN) 100 MG capsule, Take 1 capsule (100 mg total) by mouth 2 (two) times daily., Disp: 180 capsule, Rfl: 3 .  hydrochlorothiazide (HYDRODIURIL) 25 MG tablet, Take 1 tablet (25 mg total) by mouth daily. (Needs to be seen before next refill), Disp: 90 tablet, Rfl: 3 .  metoprolol succinate (TOPROL-XL) 50 MG 24 hr tablet, Take 1 tablet (50 mg total) by mouth daily. (Needs to be seen before next refill), Disp: 90 tablet, Rfl: 3 .  Multiple Vitamins-Minerals (HAIR/SKIN/NAILS PO), Take 1 tablet by mouth daily., Disp: , Rfl:  .  Omega-3 Fatty Acids (FISH OIL) 1200 MG CAPS, Take 1,200 mg by mouth 2 (two) times daily., Disp: , Rfl:   Past Medical Problems: Past Medical History:  Diagnosis Date  . Fibromyalgia   . GERD (gastroesophageal reflux disease)   . Hyperlipidemia   . Hypertension     Past Surgical History: Past Surgical History:  Procedure Laterality Date  . CARPAL TUNNEL RELEASE Bilateral   . CHOLECYSTECTOMY    . COLONOSCOPY  2001  . TUBAL LIGATION    . 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    .  UPPER GASTROINTESTINAL ENDOSCOPY      Social History: Social History   Socioeconomic History  . Marital status: Widowed    Spouse name: Not on file  . Number of children: 2  . Years of education: Not on file  . Highest education level: High school graduate  Occupational History  . Not on file  Social Needs  . Financial resource strain: Not hard at all  . Food insecurity    Worry: Never true    Inability: Never true  . Transportation needs    Medical: No    Non-medical: No  Tobacco Use  . Smoking status: Never Smoker  . Smokeless tobacco: Never Used  Substance and Sexual Activity  . Alcohol use: No  . Drug use: No  . Sexual activity: Not Currently  Lifestyle  . Physical activity    Days per week: 3 days    Minutes per session: 30 min  . Stress: Not at all   Relationships  . Social connections    Talks on phone: More than three times a week    Gets together: More than three times a week    Attends religious service: Never    Active member of club or organization: No    Attends meetings of clubs or organizations: Never    Relationship status: Widowed  . Intimate partner violence    Fear of current or ex partner: No    Emotionally abused: No    Physically abused: No    Forced sexual activity: No  Other Topics Concern  . Not on file  Social History Narrative  . Not on file    Family History: Family History  Problem Relation Age of Onset  . Hypertension Father   . Heart disease Father   . Cancer Father   . Hypertension Mother   . Heart disease Mother   . Diabetes Mother   . Hypertension Brother   . Hypertension Sister   . Colon cancer Neg Hx   . Breast cancer Neg Hx     Review of Systems: Review of Systems  Constitutional: Negative.  Negative for chills, diaphoresis, fever and malaise/fatigue.  HENT: Negative.   Eyes: Negative.   Respiratory: Negative for cough, shortness of breath and wheezing.   Cardiovascular: Negative for chest pain, palpitations and orthopnea.  Gastrointestinal: Negative.   Genitourinary: Negative.   Musculoskeletal: Positive for back pain, joint pain and neck pain. Negative for falls.  Skin: Negative.  Negative for itching and rash.  Neurological: Negative.   Psychiatric/Behavioral: Negative.     Physical Exam: Vital Signs BP (!) 147/78 (BP Location: Left Arm, Patient Position: Sitting, Cuff Size: Normal)   Pulse 89   Temp 98 F (36.7 C) (Temporal)   Ht 5\' 4"  (1.626 m)   Wt 190 lb 12.8 oz (86.5 kg)   SpO2 96%   BMI 32.75 kg/m   Physical Exam  Constitutional: She is oriented to person, place, and time and well-developed, well-nourished, and in no distress. No distress.  HENT:  Head: Normocephalic and atraumatic.  Eyes: Pupils are equal, round, and reactive to light. Conjunctivae are  normal. Right eye exhibits no discharge. Left eye exhibits no discharge.  Neck: Normal range of motion. Neck supple.    Mass on left neck, 5 x 4 mm, consistent with lipoma.   Cardiovascular: Normal rate, regular rhythm, normal heart sounds and intact distal pulses. Exam reveals no gallop and no friction rub.  No murmur heard. Pulmonary/Chest: Effort normal and breath  sounds normal. No respiratory distress. She has no wheezes. She exhibits no tenderness.  Abdominal: Soft. Bowel sounds are normal. She exhibits no distension and no mass. There is no abdominal tenderness. There is no rebound.  Musculoskeletal: Normal range of motion.        General: No edema.  Lymphadenopathy:    She has no cervical adenopathy.  Neurological: She is alert and oriented to person, place, and time. Gait normal.  Skin: Skin is warm and dry. No rash noted. She is not diaphoretic. No erythema. No pallor.  Psychiatric: Mood and affect normal.     Assessment/Plan: Mrs. Forge is  scheduled for Bilateral Breast reduction with Dr. Marla Roe.  Risks, benefits, and alternatives of procedure discussed, questions answered and consent obtained.    Overall Mrs. Akkerman is doing well.  She is ready for surgery on July 23.  She is scheduled to talk with her PCP on July 16 for a checkup and will consult with her about the swelling in her lower extremities and clearance for surgery.  Unsure of the etiology of her lower extremity swelling.  We will submit to insurance for biopsy or excision of left neck mass during her scheduled reduction on July 23.  All of her questions were answered.  She was instructed to call back if she had any other questions or concerns prior to surgery.  Prescriptions sent in.  The risk that can be encountered with breast reduction were discussed and include the following but not limited to these:  Breast asymmetry, fluid accumulation, firmness of the breast, inability to breast feed, loss of nipple or  areola, skin loss, decrease or no nipple sensation, fat necrosis of the breast tissue, bleeding, infection, healing delay.  There are risks of anesthesia, changes to skin sensation and injury to nerves or blood vessels.  The muscle can be temporarily or permanently injured.  You may have an allergic reaction to tape, suture, glue, blood products which can result in skin discoloration, swelling, pain, skin lesions, poor healing.  Any of these can lead to the need for revisonal surgery or stage procedures.  A reduction has potential to interfere with diagnostic procedures.  Nipple or breast piercing can increase risks of infection.  This procedure is best done when the breast is fully developed.  Changes in the breast will continue to occur over time.  Pregnancy can alter the outcomes of previous breast reduction surgery, weight gain and weigh loss can also effect the long term appearance.    Electronically signed by: Carola Rhine Doniqua Saxby, PA-C 07/27/2018 11:08 AM

## 2018-07-26 NOTE — Progress Notes (Addendum)
Patient ID: [name], female    DOB: Apr 14, 1950, 68 y.o.   MRN: [id number]    ICD-10-CM   1. Neck pain  M54.2   2. Symptomatic mammary hypertrophy  N62   3. Mass of left side of neck  R22.1      History of Present Illness: [name] is a 68 y.o.  female  with a history of mammary hyperplasia causing symptoms such as neck and back pain (upper and lower).  She presents for preoperative evaluation for upcoming procedure, bilateral breast reduction. Her procedure is scheduled for 08/09/18 with Dr. Marla Roe.   Her sternal to nipple distance on the right is 30cm and on the left, 29cm. She currently weighs190lbs and is 5 ft 4 inches tall. The estimated excess breast tissue to be removed in surgery is 800 grams on the right and 800 grams on the left.  She is currently feeling well.  She reports no use of any anticoagulants, no cardiac disease, no issues with anesthesia or family history of anesthesia issues.  She would like to be a size C.  She does endorse that she has noticed a mass on her left neck/shoulder area.  It appears to be about 5 x 4 mm in size.  It feels consistent with a lipoma.  May be able to biopsy or remove at the time of her breast reduction surgery.  She does note that she has noticed some swelling in her feet, she does not report any shortness of breath with activity, or shortness of breath with sleeping.  She has no history of cardiac disease other than high blood pressure for which she takes HCTZ and metoprolol.  She reports that she is going to see her PCP on July 16 and will speak to her about this.  She will also ask about clearance for surgery.   Past Medical History: Allergies: No Known Allergies  Current Medications:  Current Outpatient Medications:  .  Ascorbic Acid (VITAMIN C) 1000 MG tablet, Take 1 tablet by mouth daily., Disp: , Rfl:  .  aspirin 81 MG tablet, Take 162 mg by mouth daily. , Disp: , Rfl:  .  atorvastatin (LIPITOR) 40 MG tablet,  Take 1 tablet (40 mg total) by mouth daily. (Needs to be seen before next refill), Disp: 90 tablet, Rfl: 3 .  gabapentin (NEURONTIN) 100 MG capsule, Take 1 capsule (100 mg total) by mouth 2 (two) times daily., Disp: 180 capsule, Rfl: 3 .  hydrochlorothiazide (HYDRODIURIL) 25 MG tablet, Take 1 tablet (25 mg total) by mouth daily. (Needs to be seen before next refill), Disp: 90 tablet, Rfl: 3 .  metoprolol succinate (TOPROL-XL) 50 MG 24 hr tablet, Take 1 tablet (50 mg total) by mouth daily. (Needs to be seen before next refill), Disp: 90 tablet, Rfl: 3 .  Multiple Vitamins-Minerals (HAIR/SKIN/NAILS PO), Take 1 tablet by mouth daily., Disp: , Rfl:  .  Omega-3 Fatty Acids (FISH OIL) 1200 MG CAPS, Take 1,200 mg by mouth 2 (two) times daily., Disp: , Rfl:   Past Medical Problems: Past Medical History:  Diagnosis Date  . Fibromyalgia   . GERD (gastroesophageal reflux disease)   . Hyperlipidemia   . Hypertension     Past Surgical History: Past Surgical History:  Procedure Laterality Date  . CARPAL TUNNEL RELEASE Bilateral   . CHOLECYSTECTOMY    . COLONOSCOPY  2001  . TUBAL LIGATION    . 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    .  UPPER GASTROINTESTINAL ENDOSCOPY      Social History: Social History   Socioeconomic History  . Marital status: Widowed    Spouse name: Not on file  . Number of children: 2  . Years of education: Not on file  . Highest education level: High school graduate  Occupational History  . Not on file  Social Needs  . Financial resource strain: Not hard at all  . Food insecurity    Worry: Never true    Inability: Never true  . Transportation needs    Medical: No    Non-medical: No  Tobacco Use  . Smoking status: Never Smoker  . Smokeless tobacco: Never Used  Substance and Sexual Activity  . Alcohol use: No  . Drug use: No  . Sexual activity: Not Currently  Lifestyle  . Physical activity    Days per week: 3 days    Minutes per session: 30 min  . Stress: Not at all   Relationships  . Social connections    Talks on phone: More than three times a week    Gets together: More than three times a week    Attends religious service: Never    Active member of club or organization: No    Attends meetings of clubs or organizations: Never    Relationship status: Widowed  . Intimate partner violence    Fear of current or ex partner: No    Emotionally abused: No    Physically abused: No    Forced sexual activity: No  Other Topics Concern  . Not on file  Social History Narrative  . Not on file    Family History: Family History  Problem Relation Age of Onset  . Hypertension Father   . Heart disease Father   . Cancer Father   . Hypertension Mother   . Heart disease Mother   . Diabetes Mother   . Hypertension Brother   . Hypertension Sister   . Colon cancer Neg Hx   . Breast cancer Neg Hx     Review of Systems: Review of Systems  Constitutional: Negative.  Negative for chills, diaphoresis, fever and malaise/fatigue.  HENT: Negative.   Eyes: Negative.   Respiratory: Negative for cough, shortness of breath and wheezing.   Cardiovascular: Negative for chest pain, palpitations and orthopnea.  Gastrointestinal: Negative.   Genitourinary: Negative.   Musculoskeletal: Positive for back pain, joint pain and neck pain. Negative for falls.  Skin: Negative.  Negative for itching and rash.  Neurological: Negative.   Psychiatric/Behavioral: Negative.     Physical Exam: Vital Signs BP (!) 147/78 (BP Location: Left Arm, Patient Position: Sitting, Cuff Size: Normal)   Pulse 89   Temp 98 F (36.7 C) (Temporal)   Ht 5\' 4"  (1.626 m)   Wt 190 lb 12.8 oz (86.5 kg)   SpO2 96%   BMI 32.75 kg/m   Physical Exam  Constitutional: She is oriented to person, place, and time and well-developed, well-nourished, and in no distress. No distress.  HENT:  Head: Normocephalic and atraumatic.  Eyes: Pupils are equal, round, and reactive to light. Conjunctivae are  normal. Right eye exhibits no discharge. Left eye exhibits no discharge.  Neck: Normal range of motion. Neck supple.    Mass on left neck, 5 x 4 mm, consistent with lipoma.   Cardiovascular: Normal rate, regular rhythm, normal heart sounds and intact distal pulses. Exam reveals no gallop and no friction rub.  No murmur heard. Pulmonary/Chest: Effort normal and breath  sounds normal. No respiratory distress. She has no wheezes. She exhibits no tenderness.  Abdominal: Soft. Bowel sounds are normal. She exhibits no distension and no mass. There is no abdominal tenderness. There is no rebound.  Musculoskeletal: Normal range of motion.        General: No edema.  Lymphadenopathy:    She has no cervical adenopathy.  Neurological: She is alert and oriented to person, place, and time. Gait normal.  Skin: Skin is warm and dry. No rash noted. She is not diaphoretic. No erythema. No pallor.  Psychiatric: Mood and affect normal.     Assessment/Plan: Mrs. Virgo is  scheduled for Bilateral Breast reduction with Dr. Marla Roe.  Risks, benefits, and alternatives of procedure discussed, questions answered and consent obtained.    Overall Mrs. Porr is doing well.  She is ready for surgery on July 23.  She is scheduled to talk with her PCP on July 16 for a checkup and will consult with her about the swelling in her lower extremities and clearance for surgery.  Unsure of the etiology of her lower extremity swelling.  We will submit to insurance for biopsy or excision of left neck mass during her scheduled reduction on July 23.  All of her questions were answered.  She was instructed to call back if she had any other questions or concerns prior to surgery.  Prescriptions sent in.  The risk that can be encountered with breast reduction were discussed and include the following but not limited to these:  Breast asymmetry, fluid accumulation, firmness of the breast, inability to breast feed, loss of nipple or  areola, skin loss, decrease or no nipple sensation, fat necrosis of the breast tissue, bleeding, infection, healing delay.  There are risks of anesthesia, changes to skin sensation and injury to nerves or blood vessels.  The muscle can be temporarily or permanently injured.  You may have an allergic reaction to tape, suture, glue, blood products which can result in skin discoloration, swelling, pain, skin lesions, poor healing.  Any of these can lead to the need for revisonal surgery or stage procedures.  A reduction has potential to interfere with diagnostic procedures.  Nipple or breast piercing can increase risks of infection.  This procedure is best done when the breast is fully developed.  Changes in the breast will continue to occur over time.  Pregnancy can alter the outcomes of previous breast reduction surgery, weight gain and weigh loss can also effect the long term appearance.    Electronically signed by: Carola Rhine Orlie Cundari, PA-C 07/27/2018 11:08 AM

## 2018-07-27 ENCOUNTER — Other Ambulatory Visit: Payer: Self-pay

## 2018-07-27 ENCOUNTER — Encounter: Payer: Self-pay | Admitting: Surgical

## 2018-07-27 ENCOUNTER — Ambulatory Visit (INDEPENDENT_AMBULATORY_CARE_PROVIDER_SITE_OTHER): Payer: Medicare HMO | Admitting: Surgical

## 2018-07-27 VITALS — BP 147/78 | HR 89 | Temp 98.0°F | Ht 64.0 in | Wt 190.8 lb

## 2018-07-27 DIAGNOSIS — M542 Cervicalgia: Secondary | ICD-10-CM

## 2018-07-27 DIAGNOSIS — N62 Hypertrophy of breast: Secondary | ICD-10-CM

## 2018-07-27 DIAGNOSIS — R221 Localized swelling, mass and lump, neck: Secondary | ICD-10-CM

## 2018-07-27 MED ORDER — HYDROCODONE-ACETAMINOPHEN 5-325 MG PO TABS: 1.0000 | ORAL_TABLET | Freq: Four times a day (QID) | ORAL | 0 refills | Status: DC | PRN

## 2018-07-27 MED ORDER — CEPHALEXIN 500 MG PO CAPS: 500.0000 mg | ORAL_CAPSULE | Freq: Four times a day (QID) | ORAL | 0 refills | Status: DC

## 2018-07-27 MED ORDER — ONDANSETRON HCL 4 MG PO TABS: 4.0000 mg | ORAL_TABLET | Freq: Three times a day (TID) | ORAL | 0 refills | Status: DC | PRN

## 2018-07-27 NOTE — Addendum Note (Signed)
Addended by: Wallace Going on: 07/27/2018 03:55 PM   Modules accepted: Orders

## 2018-07-30 ENCOUNTER — Ambulatory Visit (INDEPENDENT_AMBULATORY_CARE_PROVIDER_SITE_OTHER): Payer: Medicare HMO | Admitting: Family

## 2018-07-30 ENCOUNTER — Encounter: Payer: Self-pay | Admitting: Family

## 2018-07-30 ENCOUNTER — Ambulatory Visit (INDEPENDENT_AMBULATORY_CARE_PROVIDER_SITE_OTHER): Payer: Medicare HMO

## 2018-07-30 ENCOUNTER — Other Ambulatory Visit: Payer: Self-pay

## 2018-07-30 VITALS — BP 137/74 | HR 95 | Temp 97.1°F | Ht 64.0 in | Wt 191.8 lb

## 2018-07-30 DIAGNOSIS — R609 Edema, unspecified: Secondary | ICD-10-CM

## 2018-07-30 DIAGNOSIS — Z0000 Encounter for general adult medical examination without abnormal findings: Secondary | ICD-10-CM

## 2018-07-30 DIAGNOSIS — E669 Obesity, unspecified: Secondary | ICD-10-CM | POA: Diagnosis not present

## 2018-07-30 DIAGNOSIS — I10 Essential (primary) hypertension: Secondary | ICD-10-CM

## 2018-07-30 DIAGNOSIS — K219 Gastro-esophageal reflux disease without esophagitis: Secondary | ICD-10-CM

## 2018-07-30 DIAGNOSIS — Z01818 Encounter for other preprocedural examination: Secondary | ICD-10-CM

## 2018-07-30 MED ORDER — OMEPRAZOLE 20 MG PO CPDR: 20.0000 mg | DELAYED_RELEASE_CAPSULE | Freq: Every day | ORAL | 1 refills | Status: DC

## 2018-07-30 NOTE — Progress Notes (Signed)
Subjective:    Patient ID: [name], female    DOB: 01-16-1951, 68 y.o.   MRN: [id number]  Chief Complaint  Patient presents with  . Foot Swelling  . Leg Pain  . Medical Clearance  . Edema    soft tissue swelling area on left side of neck   She is scheduled for breast reduction surgery on 08/09/18. She needs surgery clearance.   She also reports intermittent swelling in bilateral feet that started years ago. She states it is worse when she sits for long periods of time. She reports the swelling resolves after she sleeps and elevated her feet. She takes HCTZ daily.  BP stable today.   She has compression hose, but does not wear them regularly.  Hypertension This is a chronic problem. The problem has been resolved since onset. The problem is controlled. Associated symptoms include peripheral edema. Pertinent negatives include no malaise/fatigue or shortness of breath. The current treatment provides moderate improvement.  Gastroesophageal Reflux She complains of heartburn. She reports no belching or no coughing. This is a chronic problem. The current episode started more than 1 year ago. The problem occurs occasionally. She has tried a PPI for the symptoms. The treatment provided moderate relief.      Review of Systems  Constitutional: Negative for malaise/fatigue.  Respiratory: Negative for cough and shortness of breath.   Cardiovascular: Positive for leg swelling.  Gastrointestinal: Positive for heartburn.  All other systems reviewed and are negative.      Objective:   Physical Exam Vitals signs reviewed.  Constitutional:      General: She is not in acute distress.    Appearance: She is well-developed.  HENT:     Head: Normocephalic and atraumatic.     Right Ear: Tympanic membrane normal.     Left Ear: Tympanic membrane normal.  Eyes:     Pupils: Pupils are equal, round, and reactive to light.  Neck:     Musculoskeletal: Normal range of motion and neck supple.      Thyroid: No thyromegaly.  Cardiovascular:     Rate and Rhythm: Normal rate and regular rhythm.     Heart sounds: Normal heart sounds. No murmur.  Pulmonary:     Effort: Pulmonary effort is normal. No respiratory distress.     Breath sounds: Normal breath sounds. No wheezing.  Abdominal:     General: Bowel sounds are normal. There is no distension.     Palpations: Abdomen is soft.     Tenderness: There is no abdominal tenderness.  Musculoskeletal: Normal range of motion.        General: No tenderness.     Right lower leg: No edema.     Left lower leg: No edema.  Skin:    General: Skin is warm and dry.  Neurological:     Mental Status: She is alert and oriented to person, place, and time.     Cranial Nerves: No cranial nerve deficit.     Deep Tendon Reflexes: Reflexes are normal and symmetric.  Psychiatric:        Behavior: Behavior normal.        Thought Content: Thought content normal.        Judgment: Judgment normal.       BP 137/74   Pulse 95   Temp (!) 97.1 F (36.2 C) (Oral)   Ht '5\' 4"'  (1.626 m)   Wt 191 lb 12.8 oz (87 kg)   BMI 32.92 kg/m  Assessment & Plan:  [name] comes in today with chief complaint of Foot Swelling, Leg Pain, Medical Clearance, and Edema (soft tissue swelling area on left side of neck)   Diagnosis and orders addressed:  1. Pre-op exam - CMP14+EGFR - CBC with Differential/Platelet - EKG 12-Lead  2. Peripheral edema Low salt diet Compression hose daily - CMP14+EGFR - CBC with Differential/Platelet - Compression stockings  3. Essential hypertension  4. Obesity (BMI 30-39.9)  5. Gastroesophageal reflux disease, esophagitis presence not specified -Diet discussed- Avoid fried, spicy, citrus foods, caffeine and alcohol -Do not eat 2-3 hours before bedtime -Encouraged small frequent meals -Avoid NSAID's - omeprazole (PRILOSEC) 20 MG capsule; Take 1 capsule (20 mg total) by mouth daily.  Dispense: 90 capsule; Refill:  1   Labs pending Health Maintenance reviewed Diet and exercise encouraged    Evelina Dun, FNP

## 2018-07-30 NOTE — Progress Notes (Addendum)
MEDICARE ANNUAL WELLNESS VISIT  07/30/2018  Telephone Visit Disclaimer This Medicare AWV was conducted by telephone due to national recommendations for restrictions regarding the COVID-19 Pandemic (e.g. social distancing).  I verified, using two identifiers, that I am speaking with [name] or their authorized healthcare agent. I discussed the limitations, risks, security, and privacy concerns of performing an evaluation and management service by telephone and the potential availability of an in-person appointment in the future. The patient expressed understanding and agreed to proceed.   Subjective:  [name] is a 68 y.o. female patient of Hawks, Theador Hawthorne, FNP who had a Medicare Annual Wellness Visit today via telephone. [name] is Working part time and lives alone. she has 2 children. she reports that she is socially active and does interact with friends/family regularly. she is minimally physically active due to needing surgery on both knees and her hip.  Patient Care Team: Sharion Balloon, FNP as PCP - General (Nurse Practitioner) Mcarthur Rossetti, MD as Consulting Physician (Orthopedic Surgery) Paralee Cancel, MD as Consulting Physician (Orthopedic Surgery)  Advanced Directives 07/30/2018 05/24/2017 04/28/2014 04/16/2014  Does Patient Have a Medical Advance Directive? No No;Yes No No  Does patient want to make changes to medical advance directive? - No - Patient declined - -  Would patient like information on creating a medical advance directive? No - Patient declined No - Patient declined No - patient declined information -    Hospital Utilization Over the Past 12 Months: # of hospitalizations or ER visits: 0 # of surgeries: 0  Review of Systems    Patient reports that her overall health is unchanged compared to last year.  Patient Reported Readings (BP, Pulse, CBG, Weight, etc) none  Review of Systems: History obtained from chart review  All other  systems negative.  Pain Assessment Pain : No/denies pain     Current Medications & Allergies (verified) Allergies as of 07/30/2018   No Known Allergies     Medication List       Accurate as of July 30, 2018 10:09 AM. If you have any questions, ask your nurse or doctor.        STOP taking these medications   cephALEXin 500 MG capsule Commonly known as: KEFLEX   Fish Oil 1200 MG Caps   HAIR/SKIN/NAILS PO   HYDROcodone-acetaminophen 5-325 MG tablet Commonly known as: Norco   ondansetron 4 MG tablet Commonly known as: Zofran     TAKE these medications   aspirin 81 MG tablet Take 162 mg by mouth daily.   atorvastatin 40 MG tablet Commonly known as: LIPITOR Take 1 tablet (40 mg total) by mouth daily. (Needs to be seen before next refill)   gabapentin 100 MG capsule Commonly known as: NEURONTIN Take 1 capsule (100 mg total) by mouth 2 (two) times daily.   hydrochlorothiazide 25 MG tablet Commonly known as: HYDRODIURIL Take 1 tablet (25 mg total) by mouth daily. (Needs to be seen before next refill)   metoprolol succinate 50 MG 24 hr tablet Commonly known as: TOPROL-XL Take 1 tablet (50 mg total) by mouth daily. (Needs to be seen before next refill)   omeprazole 20 MG capsule Commonly known as: PRILOSEC Take 20 mg by mouth daily.   vitamin C 1000 MG tablet Take 1 tablet by mouth 2 (two) times a day.       History (reviewed): Past Medical History:  Diagnosis Date  . Fibromyalgia   . GERD (gastroesophageal reflux  disease)   . Hyperlipidemia   . Hypertension    Past Surgical History:  Procedure Laterality Date  . CARPAL TUNNEL RELEASE Bilateral   . CHOLECYSTECTOMY    . COLONOSCOPY  2001  . TUBAL LIGATION    . 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    . UPPER GASTROINTESTINAL ENDOSCOPY     Family History  Problem Relation Age of Onset  . Hypertension Father   . Heart disease Father   . Cancer Father   . Hypertension Mother   . Heart disease Mother   .  Diabetes Mother   . Hypertension Brother   . Hypertension Sister   . Colon cancer Neg Hx   . Breast cancer Neg Hx    Social History   Socioeconomic History  . Marital status: Widowed    Spouse name: Not on file  . Number of children: 2  . Years of education: Not on file  . Highest education level: High school graduate  Occupational History  . Not on file  Social Needs  . Financial resource strain: Not hard at all  . Food insecurity    Worry: Never true    Inability: Never true  . Transportation needs    Medical: No    Non-medical: No  Tobacco Use  . Smoking status: Never Smoker  . Smokeless tobacco: Never Used  Substance and Sexual Activity  . Alcohol use: No  . Drug use: No  . Sexual activity: Not Currently  Lifestyle  . Physical activity    Days per week: 0 days    Minutes per session: 0 min  . Stress: Not at all  Relationships  . Social connections    Talks on phone: More than three times a week    Gets together: More than three times a week    Attends religious service: Never    Active member of club or organization: No    Attends meetings of clubs or organizations: Never    Relationship status: Widowed  Other Topics Concern  . Not on file  Social History Narrative  . Not on file    Activities of Daily Living In your present state of health, do you have any difficulty performing the following activities: 07/30/2018  Hearing? N  Vision? Y  Comment Wears reading glasses  Difficulty concentrating or making decisions? N  Walking or climbing stairs? N  Dressing or bathing? N  Doing errands, shopping? N  Preparing Food and eating ? N  Using the Toilet? N  In the past six months, have you accidently leaked urine? N  Do you have problems with loss of bowel control? N  Managing your Medications? N  Managing your Finances? N  Housekeeping or managing your Housekeeping? N  Some recent data might be hidden    Patient Literacy How often do you need to have  someone help you when you read instructions, pamphlets, or other written materials from your doctor or pharmacy?: 1 - Never What is the last grade level you completed in school?: 12th grade  Exercise Current Exercise Habits: The patient does not participate in regular exercise at present, Exercise limited by: orthopedic condition(s)  Diet Patient reports consuming 3 meals a day and 2 snack(s) a day Patient reports that her primary diet is: Regular Patient reports that she does have regular access to food.   Depression Screen PHQ 2/9 Scores 02/15/2018 01/01/2018 05/24/2017 05/18/2017 05/12/2016 02/21/2014  PHQ - 2 Score 0 0 0 0 0 0  PHQ-  9 Score 2 - - - - -     Fall Risk Fall Risk  02/15/2018 01/01/2018 05/24/2017 05/18/2017 05/12/2016  Falls in the past year? 0 0 No No No     Objective:  [name] seemed alert and oriented and she participated appropriately during our telephone visit.  Blood Pressure Weight BMI  BP Readings from Last 3 Encounters:  07/27/18 (!) 147/78  06/01/18 (!) 156/78  02/15/18 134/71   Wt Readings from Last 3 Encounters:  07/27/18 190 lb 12.8 oz (86.5 kg)  06/01/18 193 lb (87.5 kg)  02/15/18 190 lb (86.2 kg)   BMI Readings from Last 1 Encounters:  07/27/18 32.75 kg/m    *Unable to obtain current vital signs, weight, and BMI due to telephone visit type  Hearing/Vision  . [name] did not seem to have difficulty with hearing/understanding during the telephone conversation . Reports that she has had a formal eye exam by an eye care professional within the past year . Reports that she has not had a formal hearing evaluation within the past year *Unable to fully assess hearing and vision during telephone visit type  Cognitive Function: 6CIT Screen 07/30/2018  What Year? 0 points  What month? 0 points  Count back from 20 0 points  Months in reverse 0 points  Repeat phrase 0 points   (Normal:0-7, Significant for Dysfunction: >8)  Normal Cognitive Function  Screening: Yes   Immunization & Health Maintenance Record Immunization History  Administered Date(s) Administered  . Influenza, High Dose Seasonal PF 11/10/2017  . Influenza,inj,Quad PF,6+ Mos 01/15/2015, 11/12/2015, 10/25/2016  . Influenza-Unspecified 01/29/2014  . PPD Test 03/10/2014  . Pneumococcal Conjugate-13 11/12/2015  . Pneumococcal Polysaccharide-23 05/18/2017  . Tdap 03/10/2014  . Zoster 11/28/2012  . Zoster Recombinat (Shingrix) 05/24/2017, 08/14/2017    Health Maintenance  Topic Date Due  . INFLUENZA VACCINE  08/18/2018  . MAMMOGRAM  09/30/2019  . TETANUS/TDAP  03/10/2024  . COLONOSCOPY  04/27/2024  . DEXA SCAN  Completed  . Hepatitis C Screening  Completed  . PNA vac Low Risk Adult  Completed       Assessment  This is a routine wellness examination for [name].  Health Maintenance: Due or Overdue There are no preventive care reminders to display for this patient.  [name] does not need a referral for [employer] Assistance: Care Management:   no Social Work:    no Prescription Assistance:  no Nutrition/Diabetes Education:  no   Plan:  Personalized Goals Goals Addressed   None    Personalized Health Maintenance & Screening Recommendations  Patient is up to date on all health maintenance. She is soon to receive and eye exam.   Lung Cancer Screening Recommended: no (Low Dose CT Chest recommended if Age 21-80 years, 30 pack-year currently smoking OR have quit w/in past 15 years) Hepatitis C Screening recommended: no Screening completed on 01-15-15 HIV Screening recommended: no  Advanced Directives: Written information was not prepared per patient's request.  Referrals & Orders No orders of the defined types were placed in this encounter.   Follow-up Plan . Follow-up with Sharion Balloon, FNP as planned . Schedule a follow up eye exam with your eye doctor. Patient is currently up to date on health maintenance    I have  personally reviewed and noted the following in the patient's chart:   . Medical and social history . Use of alcohol, tobacco or illicit drugs  . Current medications and supplements .  Functional ability and status . Nutritional status . Physical activity . Advanced directives . List of other physicians . Hospitalizations, surgeries, and ER visits in previous 12 months . Vitals . Screenings to include cognitive, depression, and falls . Referrals and appointments  In addition, I have reviewed and discussed with [name] certain preventive protocols, quality metrics, and best practice recommendations. A written personalized care plan for preventive services as well as general preventive health recommendations is available and can be mailed to the patient at her request.      Rolena Infante LPN 08/07/8286    I have reviewed and agree with the above AWV documentation.   Evelina Dun, FNP

## 2018-07-30 NOTE — Patient Instructions (Signed)
Peripheral Edema  Peripheral edema is swelling that is caused by a buildup of fluid. Peripheral edema most often affects the lower legs, ankles, and feet. It can also develop in the arms, hands, and face. The area of the body that has peripheral edema will look swollen. It may also feel heavy or warm. Your clothes may start to feel tight. Pressing on the area may make a temporary dent in your skin. You may not be able to move your swollen arm or leg as much as usual. There are many causes of peripheral edema. It can happen because of a complication of other conditions such as congestive heart failure, kidney disease, or a problem with your blood circulation. It also can be a side effect of certain medicines or because of an infection. It often happens to women during pregnancy. Sometimes, the cause is not known. Follow these instructions at home: Managing pain, stiffness, and swelling   Raise (elevate) your legs while you are sitting or lying down.  Move around often to prevent stiffness and to lessen swelling.  Do not sit or stand for long periods of time.  Wear support stockings as told by your health care provider. Medicines  Take over-the-counter and prescription medicines only as told by your health care provider.  Your health care provider may prescribe medicine to help your body get rid of excess water (diuretic). General instructions  Pay attention to any changes in your symptoms.  Follow instructions from your health care provider about limiting salt (sodium) in your diet. Sometimes, eating less salt may reduce swelling.  Moisturize skin daily to help prevent skin from cracking and draining.  Keep all follow-up visits as told by your health care provider. This is important. Contact a health care provider if you have:  A fever.  Edema that starts suddenly or is getting worse, especially if you are pregnant or have a medical condition.  Swelling in only one leg.  Increased  swelling, redness, or pain in one or both of your legs.  Drainage or sores at the area where you have edema. Get help right away if you:  Develop shortness of breath, especially when you are lying down.  Have pain in your chest or abdomen.  Feel weak.  Feel faint. Summary  Peripheral edema is swelling that is caused by a buildup of fluid. Peripheral edema most often affects the lower legs, ankles, and feet.  Move around often to prevent stiffness and to lessen swelling. Do not sit or stand for long periods of time.  Pay attention to any changes in your symptoms.  Contact a health care provider if you have edema that starts suddenly or is getting worse, especially if you are pregnant or have a medical condition.  Get help right away if you develop shortness of breath, especially when lying down. This information is not intended to replace advice given to you by your health care provider. Make sure you discuss any questions you have with your health care provider. Document Released: 02/11/2004 Document Revised: 09/27/2017 Document Reviewed: 09/27/2017 Elsevier Patient Education  2020 [employer].

## 2018-07-31 LAB — CMP14+EGFR
ALT: 41 IU/L — ABNORMAL HIGH (ref 0–32)
AST: 23 IU/L (ref 0–40)
Albumin/Globulin Ratio: 2.3 — ABNORMAL HIGH (ref 1.2–2.2)
Albumin: 4.6 g/dL (ref 3.8–4.8)
Alkaline Phosphatase: 68 IU/L (ref 39–117)
BUN/Creatinine Ratio: 15 (ref 12–28)
BUN: 12 mg/dL (ref 8–27)
Bilirubin Total: 0.2 mg/dL (ref 0.0–1.2)
CO2: 28 mmol/L (ref 20–29)
Calcium: 10 mg/dL (ref 8.7–10.3)
Chloride: 101 mmol/L (ref 96–106)
Creatinine, Ser: 0.82 mg/dL (ref 0.57–1.00)
GFR calc Af Amer: 86 mL/min/{1.73_m2} (ref >59)
GFR calc non Af Amer: 74 mL/min/{1.73_m2} (ref >59)
Globulin, Total: 2 g/dL (ref 1.5–4.5)
Glucose: 106 mg/dL — ABNORMAL HIGH (ref 65–99)
Potassium: 4.1 mmol/L (ref 3.5–5.2)
Sodium: 143 mmol/L (ref 134–144)
Total Protein: 6.6 g/dL (ref 6.0–8.5)

## 2018-07-31 LAB — CBC WITH DIFFERENTIAL/PLATELET
Basophils Absolute: 0.1 10*3/uL (ref 0.0–0.2)
Basos: 1 %
EOS (ABSOLUTE): 0.2 10*3/uL (ref 0.0–0.4)
Eos: 2 %
Hematocrit: 39.5 % (ref 34.0–46.6)
Hemoglobin: 13.6 g/dL (ref 11.1–15.9)
Immature Grans (Abs): 0 10*3/uL (ref 0.0–0.1)
Immature Granulocytes: 0 %
Lymphocytes Absolute: 4.4 10*3/uL — ABNORMAL HIGH (ref 0.7–3.1)
Lymphs: 46 %
MCH: 30 pg (ref 26.6–33.0)
MCHC: 34.4 g/dL (ref 31.5–35.7)
MCV: 87 fL (ref 79–97)
Monocytes Absolute: 0.8 10*3/uL (ref 0.1–0.9)
Monocytes: 8 %
Neutrophils Absolute: 4.1 10*3/uL (ref 1.4–7.0)
Neutrophils: 43 %
Platelets: 402 10*3/uL (ref 150–450)
RBC: 4.54 x10E6/uL (ref 3.77–5.28)
RDW: 13.4 % (ref 11.7–15.4)
WBC: 9.5 10*3/uL (ref 3.4–10.8)

## 2018-08-02 ENCOUNTER — Other Ambulatory Visit: Payer: Self-pay

## 2018-08-02 ENCOUNTER — Encounter (HOSPITAL_BASED_OUTPATIENT_CLINIC_OR_DEPARTMENT_OTHER): Payer: Self-pay | Admitting: *Deleted

## 2018-08-02 DIAGNOSIS — R69 Illness, unspecified: Secondary | ICD-10-CM | POA: Diagnosis not present

## 2018-08-06 ENCOUNTER — Other Ambulatory Visit (HOSPITAL_COMMUNITY)
Admission: RE | Admit: 2018-08-06 | Discharge: 2018-08-06 | Disposition: A | Discharge status: Home / Self Care | Payer: Medicare HMO | Source: Ambulatory Visit | Attending: Plastic Surgery | Admitting: Plastic Surgery

## 2018-08-06 DIAGNOSIS — Z1159 Encounter for screening for other viral diseases: Secondary | ICD-10-CM | POA: Diagnosis not present

## 2018-08-06 LAB — SARS CORONAVIRUS 2 (TAT 6-24 HRS): SARS Coronavirus 2: NEGATIVE

## 2018-08-09 ENCOUNTER — Ambulatory Visit (HOSPITAL_BASED_OUTPATIENT_CLINIC_OR_DEPARTMENT_OTHER): Payer: Medicare HMO | Admitting: Anesthesiology

## 2018-08-09 ENCOUNTER — Encounter (HOSPITAL_BASED_OUTPATIENT_CLINIC_OR_DEPARTMENT_OTHER): Payer: Self-pay

## 2018-08-09 ENCOUNTER — Other Ambulatory Visit: Payer: Self-pay

## 2018-08-09 ENCOUNTER — Encounter (HOSPITAL_BASED_OUTPATIENT_CLINIC_OR_DEPARTMENT_OTHER)
Admission: RE | Disposition: A | Discharge status: Home / Self Care | Payer: Self-pay | Source: Home / Self Care | Attending: Plastic Surgery

## 2018-08-09 ENCOUNTER — Ambulatory Visit (HOSPITAL_BASED_OUTPATIENT_CLINIC_OR_DEPARTMENT_OTHER)
Admission: RE | Admit: 2018-08-09 | Discharge: 2018-08-09 | Disposition: A | Discharge status: Home / Self Care | Payer: Medicare HMO | Attending: Plastic Surgery | Admitting: Plastic Surgery

## 2018-08-09 DIAGNOSIS — Z79899 Other long term (current) drug therapy: Secondary | ICD-10-CM | POA: Insufficient documentation

## 2018-08-09 DIAGNOSIS — K219 Gastro-esophageal reflux disease without esophagitis: Secondary | ICD-10-CM | POA: Diagnosis not present

## 2018-08-09 DIAGNOSIS — M797 Fibromyalgia: Secondary | ICD-10-CM | POA: Diagnosis not present

## 2018-08-09 DIAGNOSIS — I10 Essential (primary) hypertension: Secondary | ICD-10-CM | POA: Diagnosis not present

## 2018-08-09 DIAGNOSIS — M542 Cervicalgia: Secondary | ICD-10-CM | POA: Diagnosis not present

## 2018-08-09 DIAGNOSIS — N62 Hypertrophy of breast: Secondary | ICD-10-CM | POA: Insufficient documentation

## 2018-08-09 DIAGNOSIS — R221 Localized swelling, mass and lump, neck: Secondary | ICD-10-CM | POA: Diagnosis not present

## 2018-08-09 DIAGNOSIS — Z7982 Long term (current) use of aspirin: Secondary | ICD-10-CM | POA: Insufficient documentation

## 2018-08-09 DIAGNOSIS — E785 Hyperlipidemia, unspecified: Secondary | ICD-10-CM | POA: Diagnosis not present

## 2018-08-09 DIAGNOSIS — M549 Dorsalgia, unspecified: Secondary | ICD-10-CM | POA: Diagnosis not present

## 2018-08-09 HISTORY — PX: BREAST REDUCTION SURGERY: SHX8

## 2018-08-09 SURGERY — MAMMOPLASTY, REDUCTION
Anesthesia: General | Site: Breast | Laterality: Bilateral

## 2018-08-09 MED ORDER — SUGAMMADEX SODIUM 200 MG/2ML IV SOLN
INTRAVENOUS | Status: DC | PRN
  Administered 2018-08-09: 180 mg via INTRAVENOUS

## 2018-08-09 MED ORDER — SODIUM CHLORIDE (PF) 0.9 % IJ SOLN
INTRAMUSCULAR | Status: AC
  Filled 2018-08-09: qty 50

## 2018-08-09 MED ORDER — LIDOCAINE HCL (PF) 1 % IJ SOLN
INTRAMUSCULAR | Status: AC
  Filled 2018-08-09: qty 30

## 2018-08-09 MED ORDER — OXYCODONE HCL 5 MG PO TABS
5.0000 mg | ORAL_TABLET | ORAL | Status: DC | PRN
  Administered 2018-08-09: 5 mg via ORAL

## 2018-08-09 MED ORDER — ONDANSETRON HCL 4 MG/2ML IJ SOLN
INTRAMUSCULAR | Status: AC
  Filled 2018-08-09: qty 2

## 2018-08-09 MED ORDER — PHENYLEPHRINE 40 MCG/ML (10ML) SYRINGE FOR IV PUSH (FOR BLOOD PRESSURE SUPPORT)
PREFILLED_SYRINGE | INTRAVENOUS | Status: AC
  Filled 2018-08-09: qty 10

## 2018-08-09 MED ORDER — ROCURONIUM BROMIDE 10 MG/ML (PF) SYRINGE
PREFILLED_SYRINGE | INTRAVENOUS | Status: AC
  Filled 2018-08-09: qty 10

## 2018-08-09 MED ORDER — DEXAMETHASONE SODIUM PHOSPHATE 4 MG/ML IJ SOLN
INTRAMUSCULAR | Status: DC | PRN
  Administered 2018-08-09: 8 mg via INTRAVENOUS

## 2018-08-09 MED ORDER — ACETAMINOPHEN 650 MG RE SUPP: 650.0000 mg | RECTAL | Status: DC | PRN

## 2018-08-09 MED ORDER — SODIUM CHLORIDE 0.9 % IV SOLN
INTRAVENOUS | Status: DC | PRN
  Administered 2018-08-09: 500 mL

## 2018-08-09 MED ORDER — EPHEDRINE 5 MG/ML INJ
INTRAVENOUS | Status: AC
  Filled 2018-08-09: qty 10

## 2018-08-09 MED ORDER — FENTANYL CITRATE (PF) 100 MCG/2ML IJ SOLN
INTRAMUSCULAR | Status: AC
  Filled 2018-08-09: qty 2

## 2018-08-09 MED ORDER — PROPOFOL 10 MG/ML IV BOLUS
INTRAVENOUS | Status: DC | PRN
  Administered 2018-08-09: 200 mg via INTRAVENOUS

## 2018-08-09 MED ORDER — MIDAZOLAM HCL 2 MG/2ML IJ SOLN
1.0000 mg | INTRAMUSCULAR | Status: DC | PRN
  Administered 2018-08-09: 2 mg via INTRAVENOUS

## 2018-08-09 MED ORDER — BUPIVACAINE-EPINEPHRINE 0.25% -1:200000 IJ SOLN
INTRAMUSCULAR | Status: DC | PRN
  Administered 2018-08-09: 30 mL

## 2018-08-09 MED ORDER — DEXAMETHASONE SODIUM PHOSPHATE 10 MG/ML IJ SOLN
INTRAMUSCULAR | Status: AC
  Filled 2018-08-09: qty 1

## 2018-08-09 MED ORDER — LIDOCAINE 2% (20 MG/ML) 5 ML SYRINGE
INTRAMUSCULAR | Status: AC
  Filled 2018-08-09: qty 5

## 2018-08-09 MED ORDER — BUPIVACAINE-EPINEPHRINE (PF) 0.25% -1:200000 IJ SOLN
INTRAMUSCULAR | Status: AC
  Filled 2018-08-09: qty 30

## 2018-08-09 MED ORDER — SODIUM CHLORIDE 0.9% FLUSH: 3.0000 mL | Freq: Two times a day (BID) | INTRAVENOUS | Status: DC

## 2018-08-09 MED ORDER — SUCCINYLCHOLINE CHLORIDE 200 MG/10ML IV SOSY
PREFILLED_SYRINGE | INTRAVENOUS | Status: AC
  Filled 2018-08-09: qty 10

## 2018-08-09 MED ORDER — ACETAMINOPHEN 325 MG PO TABS: 650.0000 mg | ORAL_TABLET | ORAL | Status: DC | PRN

## 2018-08-09 MED ORDER — EPINEPHRINE PF 1 MG/ML IJ SOLN
INTRAMUSCULAR | Status: AC
  Filled 2018-08-09: qty 1

## 2018-08-09 MED ORDER — SUGAMMADEX SODIUM 500 MG/5ML IV SOLN
INTRAVENOUS | Status: AC
  Filled 2018-08-09: qty 5

## 2018-08-09 MED ORDER — SODIUM CHLORIDE 0.9 % IV SOLN
INTRAVENOUS | Status: DC | PRN
  Administered 2018-08-09: 40 ug via INTRAVENOUS

## 2018-08-09 MED ORDER — SODIUM CHLORIDE 0.9 % IJ SOLN
INTRAMUSCULAR | Status: DC | PRN
  Administered 2018-08-09: 50 mL

## 2018-08-09 MED ORDER — CEFAZOLIN SODIUM-DEXTROSE 2-4 GM/100ML-% IV SOLN
INTRAVENOUS | Status: AC
  Filled 2018-08-09: qty 100

## 2018-08-09 MED ORDER — CEFAZOLIN SODIUM-DEXTROSE 2-4 GM/100ML-% IV SOLN
2.0000 g | INTRAVENOUS | Status: AC
  Administered 2018-08-09: 2 g via INTRAVENOUS

## 2018-08-09 MED ORDER — SODIUM CHLORIDE (PF) 0.9 % IJ SOLN
INTRAMUSCULAR | Status: AC
  Filled 2018-08-09: qty 20

## 2018-08-09 MED ORDER — LIDOCAINE HCL (CARDIAC) PF 100 MG/5ML IV SOSY
PREFILLED_SYRINGE | INTRAVENOUS | Status: DC | PRN
  Administered 2018-08-09: 80 mg via INTRAVENOUS

## 2018-08-09 MED ORDER — ROCURONIUM BROMIDE 100 MG/10ML IV SOLN
INTRAVENOUS | Status: DC | PRN
  Administered 2018-08-09: 20 mg via INTRAVENOUS
  Administered 2018-08-09: 50 mg via INTRAVENOUS

## 2018-08-09 MED ORDER — OXYCODONE HCL 5 MG PO TABS
ORAL_TABLET | ORAL | Status: AC
  Filled 2018-08-09: qty 1

## 2018-08-09 MED ORDER — FENTANYL CITRATE (PF) 100 MCG/2ML IJ SOLN: 25.0000 ug | INTRAMUSCULAR | Status: DC | PRN

## 2018-08-09 MED ORDER — FENTANYL CITRATE (PF) 100 MCG/2ML IJ SOLN
50.0000 ug | INTRAMUSCULAR | Status: AC | PRN
  Administered 2018-08-09: 100 ug via INTRAVENOUS
  Administered 2018-08-09: 25 ug via INTRAVENOUS
  Administered 2018-08-09: 50 ug via INTRAVENOUS
  Administered 2018-08-09: 25 ug via INTRAVENOUS

## 2018-08-09 MED ORDER — MEPERIDINE HCL 25 MG/ML IJ SOLN: 6.2500 mg | INTRAMUSCULAR | Status: DC | PRN

## 2018-08-09 MED ORDER — CHLORHEXIDINE GLUCONATE 4 % EX LIQD: 1.0000 "application " | Freq: Once | CUTANEOUS | Status: DC

## 2018-08-09 MED ORDER — SCOPOLAMINE 1 MG/3DAYS TD PT72: 1.0000 | MEDICATED_PATCH | Freq: Once | TRANSDERMAL | Status: DC

## 2018-08-09 MED ORDER — ONDANSETRON HCL 4 MG/2ML IJ SOLN
INTRAMUSCULAR | Status: DC | PRN
  Administered 2018-08-09: 4 mg via INTRAVENOUS

## 2018-08-09 MED ORDER — LACTATED RINGERS IV SOLN
INTRAVENOUS | Status: DC
  Administered 2018-08-09 (×3): via INTRAVENOUS

## 2018-08-09 MED ORDER — PROPOFOL 500 MG/50ML IV EMUL
INTRAVENOUS | Status: AC
  Filled 2018-08-09: qty 50

## 2018-08-09 MED ORDER — MIDAZOLAM HCL 2 MG/2ML IJ SOLN
INTRAMUSCULAR | Status: AC
  Filled 2018-08-09: qty 2

## 2018-08-09 MED ORDER — BACITRACIN ZINC 500 UNIT/GM EX OINT
TOPICAL_OINTMENT | CUTANEOUS | Status: AC
  Filled 2018-08-09: qty 28.35

## 2018-08-09 MED ORDER — METOCLOPRAMIDE HCL 5 MG/ML IJ SOLN: 10.0000 mg | Freq: Once | INTRAMUSCULAR | Status: DC | PRN

## 2018-08-09 MED ORDER — SODIUM CHLORIDE 0.9% FLUSH: 3.0000 mL | INTRAVENOUS | Status: DC | PRN

## 2018-08-09 MED ORDER — SODIUM CHLORIDE 0.9 % IV SOLN: 250.0000 mL | INTRAVENOUS | Status: DC | PRN

## 2018-08-09 MED ORDER — EPHEDRINE SULFATE 50 MG/ML IJ SOLN
INTRAMUSCULAR | Status: DC | PRN
  Administered 2018-08-09: 10 mg via INTRAVENOUS

## 2018-08-09 SURGICAL SUPPLY — 76 items
ADH SKN CLS APL DERMABOND .7 (GAUZE/BANDAGES/DRESSINGS) ×6
APL PRP STRL LF DISP 70% ISPRP (MISCELLANEOUS) ×4
BAG DECANTER FOR FLEXI CONT (MISCELLANEOUS) ×3 IMPLANT
BINDER BREAST LRG (GAUZE/BANDAGES/DRESSINGS) IMPLANT
BINDER BREAST MEDIUM (GAUZE/BANDAGES/DRESSINGS) IMPLANT
BINDER BREAST XLRG (GAUZE/BANDAGES/DRESSINGS) IMPLANT
BINDER BREAST XXLRG (GAUZE/BANDAGES/DRESSINGS) ×2 IMPLANT
BIOPATCH RED 1 DISK 7.0 (GAUZE/BANDAGES/DRESSINGS) IMPLANT
BLADE CLIPPER SURG (BLADE) IMPLANT
BLADE HEX COATED 2.75 (ELECTRODE) ×3 IMPLANT
BLADE KNIFE PERSONA 10 (BLADE) ×6 IMPLANT
BLADE SURG 15 STRL LF DISP TIS (BLADE) ×1 IMPLANT
BLADE SURG 15 STRL SS (BLADE)
BNDG GAUZE ELAST 4 BULKY (GAUZE/BANDAGES/DRESSINGS) IMPLANT
CANISTER SUCT 1200ML W/VALVE (MISCELLANEOUS) ×3 IMPLANT
CHLORAPREP W/TINT 26 (MISCELLANEOUS) ×5 IMPLANT
COVER BACK TABLE REUSABLE LG (DRAPES) ×3 IMPLANT
COVER MAYO STAND REUSABLE (DRAPES) ×3 IMPLANT
COVER WAND RF STERILE (DRAPES) IMPLANT
DECANTER SPIKE VIAL GLASS SM (MISCELLANEOUS) IMPLANT
DERMABOND ADVANCED (GAUZE/BANDAGES/DRESSINGS) ×3
DERMABOND ADVANCED .7 DNX12 (GAUZE/BANDAGES/DRESSINGS) ×3 IMPLANT
DRAIN CHANNEL 19F RND (DRAIN) IMPLANT
DRAPE LAPAROSCOPIC ABDOMINAL (DRAPES) ×3 IMPLANT
DRAPE LAPAROTOMY 100X72 PEDS (DRAPES) IMPLANT
DRAPE SPLIT 6X30 W/TAPE (DRAPES) IMPLANT
DRSG PAD ABDOMINAL 8X10 ST (GAUZE/BANDAGES/DRESSINGS) ×6 IMPLANT
ELECT BLADE 4.0 EZ CLEAN MEGAD (MISCELLANEOUS)
ELECT COATED BLADE 2.86 ST (ELECTRODE) IMPLANT
ELECT REM PT RETURN 9FT ADLT (ELECTROSURGICAL) ×3
ELECTRODE BLDE 4.0 EZ CLN MEGD (MISCELLANEOUS) IMPLANT
ELECTRODE REM PT RTRN 9FT ADLT (ELECTROSURGICAL) ×2 IMPLANT
EVACUATOR SILICONE 100CC (DRAIN) IMPLANT
GAUZE SPONGE 4X4 12PLY STRL LF (GAUZE/BANDAGES/DRESSINGS) ×3 IMPLANT
GLOVE BIO SURGEON STRL SZ 6.5 (GLOVE) ×9 IMPLANT
GLOVE BIO SURGEON STRL SZ7 (GLOVE) ×3 IMPLANT
GOWN STRL REUS W/ TWL LRG LVL3 (GOWN DISPOSABLE) ×8 IMPLANT
GOWN STRL REUS W/TWL LRG LVL3 (GOWN DISPOSABLE) ×12
NDL HYPO 25X1 1.5 SAFETY (NEEDLE) ×1 IMPLANT
NDL SAFETY ECLIPSE 18X1.5 (NEEDLE) IMPLANT
NEEDLE HYPO 18GX1.5 SHARP (NEEDLE)
NEEDLE HYPO 25X1 1.5 SAFETY (NEEDLE) ×3 IMPLANT
NS IRRIG 1000ML POUR BTL (IV SOLUTION) IMPLANT
PACK BASIN DAY SURGERY FS (CUSTOM PROCEDURE TRAY) ×3 IMPLANT
PAD ALCOHOL SWAB (MISCELLANEOUS) IMPLANT
PENCIL BUTTON HOLSTER BLD 10FT (ELECTRODE) ×3 IMPLANT
PIN SAFETY STERILE (MISCELLANEOUS) IMPLANT
SLEEVE SCD COMPRESS KNEE MED (MISCELLANEOUS) ×3 IMPLANT
SPONGE GAUZE 2X2 8PLY STRL LF (GAUZE/BANDAGES/DRESSINGS) IMPLANT
SPONGE LAP 18X18 RF (DISPOSABLE) ×12 IMPLANT
STRIP CLOSURE SKIN 1/2X4 (GAUZE/BANDAGES/DRESSINGS) IMPLANT
STRIP SUTURE WOUND CLOSURE 1/2 (SUTURE) ×10 IMPLANT
SUCTION FRAZIER HANDLE 10FR (MISCELLANEOUS)
SUCTION TUBE FRAZIER 10FR DISP (MISCELLANEOUS) IMPLANT
SUT ETHILON 4 0 PS 2 18 (SUTURE) IMPLANT
SUT MNCRL AB 4-0 PS2 18 (SUTURE) ×17 IMPLANT
SUT MON AB 3-0 SH 27 (SUTURE) ×9
SUT MON AB 3-0 SH27 (SUTURE) ×6 IMPLANT
SUT MON AB 5-0 PS2 18 (SUTURE) ×10 IMPLANT
SUT PDS 3-0 CT2 (SUTURE)
SUT PDS AB 2-0 CT2 27 (SUTURE) IMPLANT
SUT PDS II 3-0 CT2 27 ABS (SUTURE) IMPLANT
SUT SILK 3 0 PS 1 (SUTURE) IMPLANT
SYR 3ML 23GX1 SAFETY (SYRINGE) ×1 IMPLANT
SYR 50ML LL SCALE MARK (SYRINGE) IMPLANT
SYR BULB 3OZ (MISCELLANEOUS) IMPLANT
SYR BULB IRRIGATION 50ML (SYRINGE) ×3 IMPLANT
SYR CONTROL 10ML LL (SYRINGE) ×3 IMPLANT
TAPE MEASURE VINYL STERILE (MISCELLANEOUS) ×3 IMPLANT
TOWEL GREEN STERILE FF (TOWEL DISPOSABLE) ×6 IMPLANT
TRAY DSU PREP LF (CUSTOM PROCEDURE TRAY) ×1 IMPLANT
TUBE CONNECTING 20X1/4 (TUBING) ×3 IMPLANT
TUBING INFILTRATION IT-10001 (TUBING) IMPLANT
TUBING SET GRADUATE ASPIR 12FT (MISCELLANEOUS) IMPLANT
UNDERPAD 30X30 (UNDERPADS AND DIAPERS) ×6 IMPLANT
YANKAUER SUCT BULB TIP NO VENT (SUCTIONS) ×3 IMPLANT

## 2018-08-09 NOTE — Op Note (Signed)
Breast Reduction Op note:    DATE OF PROCEDURE: 08/09/2018  LOCATION: [facility]  SURGEON: Lyndee Leo Sanger Dillingham, DO  ASSISTANT: Roetta Sessions, PA  PREOPERATIVE DIAGNOSIS 1. Macromastia 2. Neck Pain 3. Back Pain  POSTOPERATIVE DIAGNOSIS 1. Macromastia 2. Neck Pain 3. Back Pain  PROCEDURES 1. Bilateral breast reduction.  Right reduction 795g, Left reduction 831D  COMPLICATIONS: None.  DRAINS: none  INDICATIONS FOR PROCEDURE [name] is a 68 y.o. year-old female born on 28-Jul-1950,with a history of symptomatic macromastia with concominant back pain, neck pain, shoulder grooving from her bra.   MRN: [id number]  CONSENT Informed consent was obtained directly from the patient. The risks, benefits and alternatives were fully discussed. Specific risks including but not limited to bleeding, infection, hematoma, seroma, scarring, pain, nipple necrosis, asymmetry, poor cosmetic results, and need for further surgery were discussed. The patient had ample opportunity to have her questions answered to her satisfaction.  DESCRIPTION OF PROCEDURE  Patient was brought into the operating room and placed in a supine position.  SCDs were placed and appropriate padding was performed.  Antibiotics were given. The patient underwent general anesthesia and the chest was prepped and draped in a sterile fashion.  A timeout was performed and all information was confirmed to be correct.  Right side: Preoperative markings were confirmed.  Incision lines were injected with local with epinephrine.  After waiting for vasoconstriction, the marked lines were incised.  A Wise-pattern superomedial breast reduction was performed by de-epithelializing the pedicle, using bovie to create the superomedial pedicle, and removing breast tissue from the superior, lateral, and inferior portions of the breast.  Care was taken to not undermine the breast pedicle. Hemostasis was achieved.  The  nipple was gently rotated into position and the soft tissue closed with 4-0 Monocryl.   The pocket was irrigated and hemostasis confirmed.  The deep tissues were approximated with 3-0 monocryl sutures and the skin was closed with deep dermal and subcuticular 4-0 Monocryl sutures.  The nipple and skin flaps had good capillary refill at the end of the procedure.    Left side: Preoperative markings were confirmed.  Incision lines were injected with local with epinephrine.  After waiting for vasoconstriction, the marked lines were incised.  A Wise-pattern superomedial breast reduction was performed by de-epithelializing the pedicle, using bovie to create the superomedial pedicle, and removing breast tissue from the superior, lateral, and inferior portions of the breast.  Care was taken to not undermine the breast pedicle. Hemostasis was achieved.  The nipple was gently rotated into position and the soft tissue was closed with 4-0 Monocryl.  The patient was sat upright and size and shape symmetry was confirmed.  The pocket was irrigated and hemostasis confirmed.  The deep tissues were approximated with 3-0 monocryl sutures and the skin was closed with deep dermal and subcuticular 4-0 Monocryl sutures.  Dermabond was applied.  A breast binder and ABDs were placed.  The nipple and skin flaps had good capillary refill at the end of the procedure.  The patient tolerated the procedure well. The patient was allowed to wake from anesthesia and taken to the recovery room in satisfactory condition  The advanced practice practitioner (APP) assisted throughout the case.  The APP was essential in retraction and counter traction when needed to make the case progress smoothly.  This retraction and assistance made it possible to see the tissue plans for the procedure.  The assistance was needed for blood control, tissue re-approximation  and assisted with closure of the incision site.

## 2018-08-09 NOTE — Anesthesia Preprocedure Evaluation (Signed)
Anesthesia Evaluation  Patient identified by MRN, date of birth, ID band Patient awake    Reviewed: Allergy & Precautions, NPO status , Patient's Chart, lab work & pertinent test results  Airway Mallampati: II  TM Distance: >3 FB Neck ROM: Full    Dental no notable dental hx.    Pulmonary neg pulmonary ROS,    Pulmonary exam normal breath sounds clear to auscultation       Cardiovascular hypertension, Pt. on medications Normal cardiovascular exam Rhythm:Regular Rate:Normal     Neuro/Psych negative neurological ROS  negative psych ROS   GI/Hepatic Neg liver ROS, GERD  Medicated and Controlled,  Endo/Other  negative endocrine ROS  Renal/GU negative Renal ROS  negative genitourinary   Musculoskeletal negative musculoskeletal ROS (+)   Abdominal   Peds negative pediatric ROS (+)  Hematology negative hematology ROS (+)   Anesthesia Other Findings   Reproductive/Obstetrics negative OB ROS                             Anesthesia Physical Anesthesia Plan  ASA: II  Anesthesia Plan: General   Post-op Pain Management:    Induction: Intravenous  PONV Risk Score and Plan: 3 and Ondansetron, Dexamethasone and Treatment may vary due to age or medical condition  Airway Management Planned: LMA  Additional Equipment:   Intra-op Plan:   Post-operative Plan:   Informed Consent: I have reviewed the patients History and Physical, chart, labs and discussed the procedure including the risks, benefits and alternatives for the proposed anesthesia with the patient or authorized representative who has indicated his/her understanding and acceptance.     Dental advisory given  Plan Discussed with: CRNA  Anesthesia Plan Comments:         Anesthesia Quick Evaluation

## 2018-08-09 NOTE — Anesthesia Procedure Notes (Signed)
Procedure Name: Intubation Performed by: Willa Frater, CRNA Pre-anesthesia Checklist: Patient identified, Emergency Drugs available, Suction available and Patient being monitored Patient Re-evaluated:Patient Re-evaluated prior to induction Oxygen Delivery Method: Circle system utilized Preoxygenation: Pre-oxygenation with 100% oxygen Induction Type: IV induction Ventilation: Mask ventilation without difficulty Grade View: Grade II Tube type: Oral Tube size: 7.0 mm Number of attempts: 1 Airway Equipment and Method: Stylet and Oral airway Placement Confirmation: ETT inserted through vocal cords under direct vision,  positive ETCO2 and breath sounds checked- equal and bilateral Secured at: 21 cm Tube secured with: Tape Dental Injury: Teeth and Oropharynx as per pre-operative assessment

## 2018-08-09 NOTE — Discharge Instructions (Signed)
INSTRUCTIONS FOR AFTER SURGERY   You are having surgery.  You will likely have some questions about what to expect following your operation.  The following information will help you and your family understand what to expect when you are discharged from the hospital.  Following these guidelines will help ensure a smooth recovery and reduce risks of complications.   Postoperative instructions include information on: diet, wound care, medications and physical activity.  AFTER SURGERY Expect to go home after the procedure.  In some cases, you may need to spend one night in the hospital for observation.  DIET This surgery does not require a specific diet.  However, I have to mention that the healthier you eat the better your body can start healing. It is important to increasing your protein intake.  This means limiting the foods with sugar and carbohydrates.  Focus on vegetables and some meat.  If you have any liposuction during your procedure be sure to drink water.  If your urine is bright yellow, then it is concentrated, and you need to drink more water.  As a general rule after surgery, you should have 8 ounces of water every hour while awake.  If you find you are persistently nauseated or unable to take in liquids let us know.  NO TOBACCO USE or EXPOSURE.  This will slow your healing process and increase the risk of a wound.  WOUND CARE You can shower the day after surgery if you don't have a drain.  Use fragrance free soap.  Dial, [place] and [place] are usually mild on the skin.  If you have steri-strips / tape directly attached to your skin leave them in place. It is OK to get these wet.  No baths, pools or hot tubs for two weeks. We close your incision to leave the smallest and best-looking scar. No ointment or creams on your incisions until given the go ahead.  Especially not Neosporin (Too many skin reactions with this one).  A few weeks after surgery you can use Mederma and start massaging the  scar. We ask you to wear your binder or sports bra for the first 6 weeks around the clock, including while sleeping. This provides added comfort and helps reduce the fluid accumulation at the surgery site.  ACTIVITY No heavy lifting until cleared by the doctor.  It is OK to walk and climb stairs. In fact, moving your legs is very important to decrease your risk of a blood clot.  It will also help keep you from getting deconditioned.  Every 1 to 2 hours get up and walk for 5 minutes. This will help with a quicker recovery back to normal.  Let pain be your guide so you don't do too much.  NO, you cannot do the spring cleaning and don't plan on taking care of anyone else.  This is your time for TLC.  You will be more comfortable if you sleep and rest with your head elevated either with a few pillows under you or in a recliner.  No stomach sleeping for a few months.  WORK Everyone returns to work at different times. As a rough guide, most people take at least 1 - 2 weeks off prior to returning to work. If you need documentation for your job, bring the forms to your postoperative follow up visit.  DRIVING Arrange for someone to bring you home from the hospital.  You may be able to drive a few days after surgery but not while taking  any narcotics or valium.  BOWEL MOVEMENTS Constipation can occur after anesthesia and while taking pain medication.  It is important to stay ahead for your comfort.  We recommend taking Milk of Magnesia (2 tablespoons; twice a day) while taking the pain pills.  SEROMA This is fluid your body tried to put in the surgical site.  This is normal but if it creates tight skinny skin let us know.  It usually decreases in a few weeks.  WHEN TO CALL Call your surgeon's office if any of the following occur:  Fever 101 degrees F or greater  Excessive bleeding or fluid from the incision site.  Pain that increases over time without aid from the medications  Redness, warmth, or  pus draining from incision sites  Persistent nausea or inability to take in liquids  Severe misshapen area that underwent the operation.      Post Anesthesia Home Care Instructions  Activity: Get plenty of rest for the remainder of the day. A responsible individual must stay with you for 24 hours following the procedure.  For the next 24 hours, DO NOT: -Drive a car -Paediatric nurse -Drink alcoholic beverages -Take any medication unless instructed by your physician -Make any legal decisions or sign important papers.  Meals: Start with liquid foods such as gelatin or soup. Progress to regular foods as tolerated. Avoid greasy, spicy, heavy foods. If nausea and/or vomiting occur, drink only clear liquids until the nausea and/or vomiting subsides. Call your physician if vomiting continues.  Special Instructions/Symptoms: Your throat may feel dry or sore from the anesthesia or the breathing tube placed in your throat during surgery. If this causes discomfort, gargle with warm salt water. The discomfort should disappear within 24 hours.  If you had a scopolamine patch placed behind your ear for the management of post- operative nausea and/or vomiting:  1. The medication in the patch is effective for 72 hours, after which it should be removed.  Wrap patch in a tissue and discard in the trash. Wash hands thoroughly with soap and water. 2. You may remove the patch earlier than 72 hours if you experience unpleasant side effects which may include dry mouth, dizziness or visual disturbances. 3. Avoid touching the patch. Wash your hands with soap and water after contact with the patch.

## 2018-08-09 NOTE — Anesthesia Postprocedure Evaluation (Signed)
Anesthesia Post Note  Patient: [name]  Procedure(s) Performed: MAMMARY REDUCTION  (BREAST) (Bilateral Breast)     Patient location during evaluation: PACU Anesthesia Type: General Level of consciousness: awake and alert Pain management: pain level controlled Vital Signs Assessment: post-procedure vital signs reviewed and stable Respiratory status: spontaneous breathing, nonlabored ventilation, respiratory function stable and patient connected to nasal cannula oxygen Cardiovascular status: blood pressure returned to baseline and stable Postop Assessment: no apparent nausea or vomiting Anesthetic complications: no    Last Vitals:  Vitals:   08/09/18 1204 08/09/18 1300  BP:  126/67  Pulse: 98 98  Resp: 14 16  Temp:  37.1 C  SpO2: 91% 96%    Last Pain:  Vitals:   08/09/18 1300  TempSrc:   PainSc: 0-No pain                 Montez Hageman

## 2018-08-09 NOTE — Interval H&P Note (Signed)
History and Physical Interval Note:  08/09/2018 7:10 AM  [name]  has presented today for surgery, with the diagnosis of Symptomatic mammary hypertrophy; Neck Pain; Chronic bilateral thoracic back pain.  The various methods of treatment have been discussed with the patient and family. After consideration of risks, benefits and other options for treatment, the patient has consented to  Procedure(s): MAMMARY REDUCTION  (BREAST) (Bilateral) EXCISION LIPOMA LEF SHOULDER (Left) as a surgical intervention.  The patient's history has been reviewed, patient examined, no change in status, stable for surgery.  I have reviewed the patient's chart and labs.  Questions were answered to the patient's satisfaction.     [name]

## 2018-08-09 NOTE — Transfer of Care (Signed)
Immediate Anesthesia Transfer of Care Note  Patient: [name]  Procedure(s) Performed: MAMMARY REDUCTION  (BREAST) (Bilateral Breast)  Patient Location: PACU  Anesthesia Type:General  Level of Consciousness: awake, alert , oriented and drowsy  Airway & Oxygen Therapy: Patient Spontanous Breathing and Patient connected to face mask oxygen  Post-op Assessment: Report given to RN and Post -op Vital signs reviewed and stable  Post vital signs: Reviewed and stable  Last Vitals:  Vitals Value Taken Time  BP 131/77 08/09/18 1054  Temp    Pulse 98 08/09/18 1057  Resp 15 08/09/18 1057  SpO2 98 % 08/09/18 1057  Vitals shown include unvalidated device data.  Last Pain:  Vitals:   08/09/18 0659  TempSrc: Oral  PainSc: 0-No pain         Complications: No apparent anesthesia complications

## 2018-08-10 ENCOUNTER — Encounter (HOSPITAL_BASED_OUTPATIENT_CLINIC_OR_DEPARTMENT_OTHER): Payer: Self-pay | Admitting: Plastic Surgery

## 2018-08-15 ENCOUNTER — Telehealth: Payer: Self-pay | Admitting: Plastic Surgery

## 2018-08-15 NOTE — Telephone Encounter (Signed)

## 2018-08-16 ENCOUNTER — Encounter: Payer: Self-pay | Admitting: Surgical

## 2018-08-16 ENCOUNTER — Encounter: Payer: Medicare HMO | Admitting: Surgical

## 2018-08-16 ENCOUNTER — Other Ambulatory Visit: Payer: Self-pay

## 2018-08-16 ENCOUNTER — Ambulatory Visit (INDEPENDENT_AMBULATORY_CARE_PROVIDER_SITE_OTHER): Payer: Medicare HMO | Admitting: Surgical

## 2018-08-16 VITALS — BP 137/79 | HR 85 | Temp 96.4°F | Ht 64.0 in | Wt 191.0 lb

## 2018-08-16 DIAGNOSIS — M546 Pain in thoracic spine: Secondary | ICD-10-CM

## 2018-08-16 DIAGNOSIS — G8929 Other chronic pain: Secondary | ICD-10-CM

## 2018-08-16 DIAGNOSIS — N62 Hypertrophy of breast: Secondary | ICD-10-CM

## 2018-08-16 DIAGNOSIS — M542 Cervicalgia: Secondary | ICD-10-CM

## 2018-08-16 NOTE — Progress Notes (Signed)
   Subjective:     Patient ID: [name], female    DOB: Feb 24, 1950, 68 y.o.   MRN: [id number]  Chief Complaint  Patient presents with  . Post-op Follow-up    (B) breast reduction    HPI: The patient is a 68 y.o. female here for follow-up after bilateral breast reduction on 08/09/2018 with Dr. Marla Roe.  Reports that she is overall doing very well and has had minimal pain.  She has had adequate pain control with Norco and has not had to take much of it at all.  Her incisions are healing well.  She reports that she tried to wear sports bra but it was too tight.  She is continuing to wear the breast binder provided postoperatively.  She is going to look for a larger sports bra.  She is able to comfortably move and reports being able to do some light housework.  She reports that her neck and back pain have improved.  She denies any fevers, chills.  Steri-Strips and Dermabond still in place.  Review of Systems  Constitutional: Negative for chills, diaphoresis and fever.  HENT: Negative.   Respiratory: Negative.   Cardiovascular: Negative.   Genitourinary: Negative.   Musculoskeletal: Negative for back pain, myalgias and neck pain.  Skin: Negative for itching and rash.  Neurological: Negative.      Objective:   Vital Signs BP 137/79 (BP Location: Left Arm, Patient Position: Sitting, Cuff Size: Large)   Pulse 85   Temp (!) 96.4 F (35.8 C) (Temporal)   Ht 5\' 4"  (1.626 m)   Wt 191 lb (86.6 kg)   SpO2 96%   BMI 32.79 kg/m  Vital Signs and Nursing Note Reviewed Chaperone present on exam Physical Exam  Constitutional: She is oriented to person, place, and time and well-developed, well-nourished, and in no distress.  HENT:  Head: Normocephalic and atraumatic.  Cardiovascular: Normal rate.  Pulmonary/Chest: Effort normal.  Incisions around the nipple areola healing well.  Incision the vertical limb covered with Steri-Strips and Dermabond.  No sign of erythema or infection.   Neurological: She is alert and oriented to person, place, and time. Gait normal.  Skin: Skin is warm and dry. No rash noted. She is not diaphoretic. No erythema. No pallor.  Psychiatric: Mood and affect normal.      Assessment/Plan:     ICD-10-CM   1. Neck pain  M54.2   2. Symptomatic mammary hypertrophy  N62   3. Chronic bilateral thoracic back pain  M54.6    G89.29    [name] is doing well.  She has had minimal pain and there does not appear to be any overt swelling, seroma, hematoma. She should continue wearing the breast binder or move into a sports bra that is not too tight.  She understands to avoid lifting anything heavier than 10 to 15 pounds.  She understands and reports she has been doing this.  She is continuing to eat well.  Continue to hydrate, take a multivitamin and vitamin C to promote healing.  Follow-up In 2 weeks  Carola Rhine Roshunda Keir, PA-C 08/16/2018, 2:29 PM

## 2018-08-17 ENCOUNTER — Encounter: Payer: Medicare HMO | Admitting: Plastic Surgery

## 2018-08-17 ENCOUNTER — Telehealth: Payer: Self-pay

## 2018-08-17 NOTE — Telephone Encounter (Signed)
Patient called asking for a return to work date. Patient asking for call back at [phone]  I am happy to write a return to [facility] [facility] [employer] letter. I just need to know what dates and/or any restrictions

## 2018-08-20 ENCOUNTER — Telehealth: Payer: Self-pay

## 2018-08-20 NOTE — Telephone Encounter (Signed)
Patient called to schedule appointment for tomorrow. Patient answered the following questions: 1. To the best of your knowledge, have you been in close contact with any one with a confirmed diagnosis of COVID-19? No 2. Have you had any one or more of the following; fever, chills, cough, shortness of breath, or any flu-like symptoms? No 3. Have you been diagnosed with or have a previous diagnosis of COVID 19? No 4. I am going to go over a few other symptoms with you. Please let me know if you are experiencing any of the following: None of the below a. Ear, nose, or throat discomfort b. A sore throat c. Headache d. Muscle pain e. Diarrhea f. Loss of taste or smell

## 2018-08-20 NOTE — Telephone Encounter (Signed)
[name] called over the weekend with concerns for fluid collection in her breasts. 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 She feels as if her breasts are enlarging due to the fluid and has had pain over the weekend.   She does not have any fevers or chills, nausea or vomiting. She is scheduled to come in tomorrow for a visit to have the issue evaluated and possible drainage pending exam.  She reports feeling well other than the pain.

## 2018-08-21 ENCOUNTER — Encounter: Payer: Self-pay | Admitting: Plastic Surgery

## 2018-08-21 ENCOUNTER — Other Ambulatory Visit: Payer: Self-pay

## 2018-08-21 ENCOUNTER — Ambulatory Visit (INDEPENDENT_AMBULATORY_CARE_PROVIDER_SITE_OTHER): Payer: Medicare HMO | Admitting: Plastic Surgery

## 2018-08-21 VITALS — BP 126/73 | HR 84 | Temp 96.9°F | Ht 64.0 in | Wt 190.4 lb

## 2018-08-21 DIAGNOSIS — N62 Hypertrophy of breast: Secondary | ICD-10-CM

## 2018-08-21 NOTE — Progress Notes (Signed)
The patient is a 68 year old female here for follow-up after undergoing bilateral breast reductions.  She felt some fluid building up and a little bit of tightness in both breasts.  She said the left was worse than the right.  On exam she does have some swelling.  I was able to aspirate 370 cc out of the left breast and 300 cc out of the right the fluid was serosanguineous.  She has no sign of infection.  She does not appear to be actively bleeding.  There is no cellulitis and the incisions are healing nicely.  Would like to see her back in 1 to 2 weeks.

## 2018-08-23 ENCOUNTER — Telehealth: Payer: Self-pay

## 2018-08-23 DIAGNOSIS — M25551 Pain in right hip: Secondary | ICD-10-CM | POA: Diagnosis not present

## 2018-08-23 DIAGNOSIS — M25552 Pain in left hip: Secondary | ICD-10-CM | POA: Diagnosis not present

## 2018-08-23 NOTE — Telephone Encounter (Signed)

## 2018-08-23 NOTE — Progress Notes (Signed)
   Subjective:     Patient ID: [name], female    DOB: 13-Sep-1950, 69 y.o.   MRN: [id number]  Chief Complaint  Patient presents with  . Follow-up    (L) breast sore-needs drain    HPI: The patient is a 68 y.o. female here for follow-up after bilateral breast reduction on 08/09/18. She was last seen on 08/21/18 and had 370 cc (left) and 300 cc (right) aspirated from her breasts. The fluid had been serosanguinous.  Today she is doing well. No complaint other than breast swelling and tenderness.  Incisions are healing nicely, no sign of infection. No redness noted.  Review of Systems  Constitutional: Negative for chills, diaphoresis and fever.  HENT: Negative.   Respiratory: Negative.   Cardiovascular: Negative.   Musculoskeletal: Negative for back pain and neck pain.  Skin: Negative.      Objective:   Vital Signs BP (!) 144/75 (BP Location: Left Arm, Patient Position: Sitting, Cuff Size: Normal)   Pulse 81   Temp (!) 96.6 F (35.9 C) (Temporal)   Ht 5\' 4"  (1.626 m)   Wt 189 lb 3.2 oz (85.8 kg)   SpO2 96%   BMI 32.48 kg/m  Vital Signs and Nursing Note Reviewed  Physical Exam  Constitutional: She is oriented to person, place, and time and well-developed, well-nourished, and in no distress. No distress.  HENT:  Head: Normocephalic and atraumatic.  Cardiovascular: Normal rate and intact distal pulses.  Pulmonary/Chest: Effort normal.    Musculoskeletal:        General: No edema.  Neurological: She is alert and oriented to person, place, and time. Gait normal.  Skin: Skin is warm and dry. No rash noted. She is not diaphoretic. No erythema. No pallor.  Psychiatric: Affect and judgment normal.      Assessment/Plan:     ICD-10-CM   1. Symptomatic mammary hypertrophy  N62     We were able to aspirate 70 cc out of the right breast and 60 cc out of the left breast. Fluid was serosanguinous. No sign of active bleeding. No cellulitis.  Follow up in 1 to 2 weeks  or sooner if needed.   Carola Rhine Tarl Cephas, PA-C 08/24/2018, 11:13 AM

## 2018-08-24 ENCOUNTER — Other Ambulatory Visit: Payer: Self-pay

## 2018-08-24 ENCOUNTER — Ambulatory Visit (INDEPENDENT_AMBULATORY_CARE_PROVIDER_SITE_OTHER): Payer: Medicare HMO | Admitting: Surgical

## 2018-08-24 ENCOUNTER — Encounter: Payer: Self-pay | Admitting: Surgical

## 2018-08-24 VITALS — BP 144/75 | HR 81 | Temp 96.6°F | Ht 64.0 in | Wt 189.2 lb

## 2018-08-24 DIAGNOSIS — N62 Hypertrophy of breast: Secondary | ICD-10-CM

## 2018-08-29 ENCOUNTER — Other Ambulatory Visit: Payer: Self-pay | Admitting: Family

## 2018-08-29 DIAGNOSIS — Z1231 Encounter for screening mammogram for malignant neoplasm of breast: Secondary | ICD-10-CM

## 2018-08-30 NOTE — Progress Notes (Signed)
   Subjective:     Patient ID: [name], female    DOB: 27-Jan-1950, 68 y.o.   MRN: [id number]  Chief Complaint  Patient presents with  . Follow-up    2 weeks for (B) breast reduction    HPI: The patient is a 68 y.o. female here for follow-up after bilateral breast reduction on 08/09/18. ~4 weeks post op.   She has been doing good overall. Not back to work yet, but reports she is feeling well.  She is unsure if she has any fluid in her left breast. No discomfort or pain.  No fevers, chills, n/v.  Neck and back pain improved.  Review of Systems  Constitutional: Negative for chills and fever.  HENT: Negative.   Cardiovascular: Negative.   Genitourinary: Negative.   Musculoskeletal: Negative.  Negative for back pain and neck pain.  Skin: Negative for itching and rash.     Objective:   Vital Signs BP 137/69 (BP Location: Left Arm, Patient Position: Sitting, Cuff Size: Normal)   Pulse 87   Temp (!) 96.9 F (36.1 C) (Temporal)   Ht 5\' 4"  (1.626 m)   Wt 188 lb 6.4 oz (85.5 kg)   SpO2 94%   BMI 32.34 kg/m  Vital Signs and Nursing Note Reviewed Chaperone present  Physical Exam  Constitutional: She is oriented to person, place, and time and well-developed, well-nourished, and in no distress. No distress.  HENT:  Head: Normocephalic and atraumatic.  Cardiovascular: Normal rate.  Pulmonary/Chest: Effort normal.  Musculoskeletal: Normal range of motion.  Neurological: She is alert and oriented to person, place, and time. Gait normal.  Skin: Skin is warm and dry. She is not diaphoretic. No erythema.  Wound/incisions: Incisions c/d/i, no sign of infection. No drainage  Assessment/Plan:     ICD-10-CM   1. Neck pain  M54.2   2. Chronic bilateral thoracic back pain  M54.6    G89.29   3. Symptomatic mammary hypertrophy  N62    Overall, she is doing great. We drained 170 cc of serosanguinous fluid from her left breast today. Massage the hard areas of the breast to help  break up any fat necrosis. Continue eating healthy, drinking plenty of fluids.  Follow up in 1 week for re-evaluation.  Carola Rhine Tinleigh Whitmire, PA-C 08/31/2018, 1:16 PM

## 2018-08-31 ENCOUNTER — Other Ambulatory Visit: Payer: Self-pay

## 2018-08-31 ENCOUNTER — Ambulatory Visit (INDEPENDENT_AMBULATORY_CARE_PROVIDER_SITE_OTHER): Payer: Medicare HMO | Admitting: Surgical

## 2018-08-31 ENCOUNTER — Encounter: Payer: Self-pay | Admitting: Surgical

## 2018-08-31 VITALS — BP 137/69 | HR 87 | Temp 96.9°F | Ht 64.0 in | Wt 188.4 lb

## 2018-08-31 DIAGNOSIS — M546 Pain in thoracic spine: Secondary | ICD-10-CM

## 2018-08-31 DIAGNOSIS — G8929 Other chronic pain: Secondary | ICD-10-CM

## 2018-08-31 DIAGNOSIS — N62 Hypertrophy of breast: Secondary | ICD-10-CM

## 2018-08-31 DIAGNOSIS — M542 Cervicalgia: Secondary | ICD-10-CM

## 2018-09-07 ENCOUNTER — Other Ambulatory Visit: Payer: Self-pay

## 2018-09-07 ENCOUNTER — Ambulatory Visit (INDEPENDENT_AMBULATORY_CARE_PROVIDER_SITE_OTHER): Payer: Medicare HMO | Admitting: Surgical

## 2018-09-07 ENCOUNTER — Encounter: Payer: Self-pay | Admitting: Surgical

## 2018-09-07 VITALS — BP 160/89 | HR 83 | Temp 98.0°F | Ht 64.0 in | Wt 188.0 lb

## 2018-09-07 DIAGNOSIS — M542 Cervicalgia: Secondary | ICD-10-CM

## 2018-09-07 DIAGNOSIS — M546 Pain in thoracic spine: Secondary | ICD-10-CM

## 2018-09-07 DIAGNOSIS — N62 Hypertrophy of breast: Secondary | ICD-10-CM

## 2018-09-07 DIAGNOSIS — G8929 Other chronic pain: Secondary | ICD-10-CM

## 2018-09-07 NOTE — Progress Notes (Signed)
   Subjective:     Patient ID: [name], female    DOB: Dec 27, 1950, 68 y.o.   MRN: [id number]  No chief complaint on file.   HPI: The patient is a 68 y.o. female here for follow-up on bilateral breast reduction. Last seen 08/31/18. During that visit we drained 170 cc of serosanguinous fluid from her left breast.  Today she reports that she feels as if there is some swelling.  She has been massaging the hard areas of her breast, but has not seen much improvement.  Denies any fevers, chills, nausea, vomiting.  No other complaints today.  She has been using ice under her arm/lateral breast.  Shoulder pain, neck pain, back pain all improved since reduction surgery.  Review of Systems  Constitutional: Negative for chills, diaphoresis, fever and weight loss.  Respiratory: Negative.   Cardiovascular: Negative.   Musculoskeletal: Negative for back pain and neck pain.  Skin: Negative.   Neurological: Negative for dizziness, sensory change and headaches.     Objective:   Vital Signs BP (!) 160/89 (BP Location: Left Arm, Patient Position: Sitting, Cuff Size: Large)   Pulse 83   Temp 98 F (36.7 C) (Temporal)   Ht 5\' 4"  (1.626 m)   Wt 188 lb (85.3 kg)   SpO2 96%   BMI 32.27 kg/m  Vital Signs and Nursing Note Reviewed Chaperone present Physical Exam  Constitutional: She is oriented to person, place, and time and well-developed, well-nourished, and in no distress. No distress.  HENT:  Head: Normocephalic and atraumatic.  Cardiovascular: Normal rate.  Pulmonary/Chest: Effort normal.  Musculoskeletal: Normal range of motion.  Neurological: She is alert and oriented to person, place, and time. Gait normal.  Skin: Skin is warm and dry. No rash noted. She is not diaphoretic. No erythema. No pallor.  Psychiatric: Mood and affect normal.   Breast: Bilateral breast incisions are clear dry and intact.  Healing well.  No sign of infection, erythema.  No discernible fluid wave on exam.   areas of hardness due to fat necrosis noted.  Steri-Strips intact.    Assessment/Plan:     ICD-10-CM   1. Neck pain  M54.2   2. Chronic bilateral thoracic back pain  M54.6    G89.29   3. Symptomatic mammary hypertrophy  N62     [name] is doing well overall.  All of her incisions are healing well.  She can begin using Mederma cream for incisional scarring in 1 to 2 weeks.  She can remove the Steri-Strips when showering in the next few days.  Continue to massage the hard areas of fat necrosis.  Continue with ice in the lateral aspect of her bilateral breast.  No discernible fluid wave on exam, potentially some seroma noted but not enough to warrant an aspiration.   Call with any questions or concerns.  Follow-up in 2 weeks either in person or tele-visit.  Carola Rhine Verleen Stuckey, PA-C 09/07/2018, 10:18 AM

## 2018-09-20 ENCOUNTER — Telehealth: Payer: Self-pay

## 2018-09-20 NOTE — Telephone Encounter (Signed)

## 2018-09-21 ENCOUNTER — Encounter: Payer: Self-pay | Admitting: Surgical

## 2018-09-21 ENCOUNTER — Other Ambulatory Visit: Payer: Self-pay

## 2018-09-21 ENCOUNTER — Ambulatory Visit (INDEPENDENT_AMBULATORY_CARE_PROVIDER_SITE_OTHER): Payer: Medicare HMO | Admitting: Surgical

## 2018-09-21 VITALS — BP 145/74 | HR 82 | Temp 94.1°F | Ht 64.0 in | Wt 189.6 lb

## 2018-09-21 DIAGNOSIS — N6489 Other specified disorders of breast: Secondary | ICD-10-CM

## 2018-09-21 DIAGNOSIS — N62 Hypertrophy of breast: Secondary | ICD-10-CM

## 2018-09-21 NOTE — Progress Notes (Signed)
HPI.  [name] is a 68 year old female here for follow-up of bilateral breast reduction. She was last seen on 09/07/2018.  At that time there was no evidence of fluid wave or fluid accumulation in her breasts.  Today she reports noticing the left breast has become a little bit more swollen and is larger than the right breast.  On exam there is no discernible fluid wave of either breast. Bilateral breasts are quite tender to palpation and tender to massage at home.  She has been massaging daily.  Reports no improvement.  She denies any fevers, chills, nausea, vomiting.  PE: BP (!) 145/74 (BP Location: Left Arm, Patient Position: Sitting, Cuff Size: Normal)   Pulse 82   Temp (!) 94.1 F (34.5 C) (Temporal)   Ht 5\' 4"  (1.626 m)   Wt 189 lb 9.6 oz (86 kg)   SpO2 97%   BMI 32.54 kg/m   Chaperone present.  Vital signs and nursing note reviewed.  On exam the left breast appears more swollen than the right breast.  Her incisions are healing very nicely they are C/D/I.  There is no wound.  No sign of infection, erythema.  She does have hardness due to fat necrosis in bilateral breasts.  Tenderness to palpation bilaterally.  Plan:  We were able to aspirate 290 cc of serous (straw-colored) fluid out of her left breast today.  No sign of infection, purulence, bloody drainage.  The right breast was not aspirated due to no evidence of fluid wave or increase in size.  She immediately had relief after drainage of the left breast.  She reports that she has not been doing any excessive movements or excessive lifting to because of the fluid buildup.  Unsure of the cause of continuous buildup at this time.  We will continue to follow.  Patient to follow-up on 10/02/2018.  Call with any questions or concerns prior to that visit.  She has not had any fevers, chills, nausea, vomiting.

## 2018-09-21 NOTE — Addendum Note (Signed)
Addended byRoetta Sessions on: 09/21/2018 04:47 PM   Modules accepted: Orders

## 2018-10-01 ENCOUNTER — Telehealth: Payer: Self-pay

## 2018-10-01 NOTE — Telephone Encounter (Signed)

## 2018-10-02 ENCOUNTER — Ambulatory Visit: Payer: Medicare HMO | Admitting: Surgical

## 2018-10-05 ENCOUNTER — Ambulatory Visit: Payer: Self-pay | Admitting: Family

## 2018-10-09 ENCOUNTER — Ambulatory Visit: Payer: Medicare HMO | Admitting: Physician Assistant

## 2018-10-09 ENCOUNTER — Other Ambulatory Visit: Payer: Self-pay

## 2018-10-09 ENCOUNTER — Encounter: Payer: Self-pay | Admitting: Surgical

## 2018-10-09 ENCOUNTER — Ambulatory Visit (INDEPENDENT_AMBULATORY_CARE_PROVIDER_SITE_OTHER): Payer: Medicare HMO | Admitting: Surgical

## 2018-10-09 VITALS — BP 149/78 | HR 97 | Temp 97.5°F | Ht 64.0 in | Wt 190.4 lb

## 2018-10-09 DIAGNOSIS — N6489 Other specified disorders of breast: Secondary | ICD-10-CM

## 2018-10-09 DIAGNOSIS — G8929 Other chronic pain: Secondary | ICD-10-CM

## 2018-10-09 DIAGNOSIS — M546 Pain in thoracic spine: Secondary | ICD-10-CM

## 2018-10-09 DIAGNOSIS — M542 Cervicalgia: Secondary | ICD-10-CM

## 2018-10-09 DIAGNOSIS — N62 Hypertrophy of breast: Secondary | ICD-10-CM

## 2018-10-09 NOTE — Progress Notes (Signed)
   Subjective:     Patient ID: [name], female    DOB: 1950/06/13, 68 y.o.   MRN: [id number]  Chief Complaint  Patient presents with  . Follow-up    2 weeks for (B) breast reduction    HPI: The patient is a 68 y.o. female here for follow-up on bilateral breast reduction. She is ~ 2 months post-op.  Today she reports that she has noticed her left breast has become swollen again.  She is also noticed that her right breast has not become softer and has continued to harden.  She reports that she has been massaging but not too frequent.  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  No fever, chills, nausea, vomiting.  Her incisions are healing nicely and they are C/D/I.  No sign of dehiscence.  Her neck and back pain has nearly resolved. she is doing well  Review of Systems  Constitutional: Negative for chills, diaphoresis, fever, malaise/fatigue and weight loss.  HENT: Negative.   Respiratory: Negative.   Cardiovascular: Negative.   Musculoskeletal: Negative for back pain and neck pain.  Skin: Negative for itching and rash.  Neurological: Positive for sensory change. Negative for weakness.     Objective:   Vital Signs BP (!) 149/78 (BP Location: Left Arm, Patient Position: Sitting, Cuff Size: Normal) Comment: Patient has not taking BP meds  Pulse 97   Temp (!) 97.5 F (36.4 C) (Temporal)   Ht 5\' 4"  (1.626 m)   Wt 190 lb 6.4 oz (86.4 kg)   SpO2 97%   BMI 32.68 kg/m  Vital Signs and Nursing Note Reviewed Chaperone present Physical Exam  Constitutional: She is oriented to person, place, and time and well-developed, well-nourished, and in no distress.  HENT:  Head: Normocephalic and atraumatic.  Cardiovascular: Normal rate.  Pulmonary/Chest: Effort normal.    Musculoskeletal: Normal range of motion.        General: Tenderness (Right lateral breast) present. No deformity.  Neurological: She is alert and oriented to person, place, and time. Gait  normal.  Skin: Skin is warm and dry. No rash noted. She is not diaphoretic. No erythema. No pallor.  Psychiatric: Mood and affect normal.      Assessment/Plan:     ICD-10-CM   1. Symptomatic mammary hypertrophy  N62   2. Seroma of breast  N64.89   3. Chronic bilateral thoracic back pain  M54.6    G89.29   4. Neck pain  M54.2     Mrs. Ener is doing well overall but is having some difficulty with the left breast continuing to develop a seroma in the right breast having some fat necrosis and scarring at the superior lateral pole.  She has been doing some massaging on the right but not as much as she would like.  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  No sign of infection, incisions are c/d/i. 140 cc of serous straw colored fluid aspirated from left breast. Right breast scarring - continue to massage with lotion or vaseline to help break up scar tissue.   Follow up in 2 weeks.   Carola Rhine Liseth Wann, PA-C 10/09/2018, 2:20 PM

## 2018-10-15 ENCOUNTER — Other Ambulatory Visit: Payer: Self-pay

## 2018-10-16 ENCOUNTER — Ambulatory Visit (INDEPENDENT_AMBULATORY_CARE_PROVIDER_SITE_OTHER): Payer: Medicare HMO | Admitting: Family Medicine

## 2018-10-16 ENCOUNTER — Encounter: Payer: Self-pay | Admitting: Family Medicine

## 2018-10-16 VITALS — BP 125/73 | HR 80 | Temp 98.0°F | Resp 20 | Ht 64.0 in | Wt 190.0 lb

## 2018-10-16 DIAGNOSIS — D229 Melanocytic nevi, unspecified: Secondary | ICD-10-CM | POA: Diagnosis not present

## 2018-10-16 DIAGNOSIS — Z808 Family history of malignant neoplasm of other organs or systems: Secondary | ICD-10-CM | POA: Diagnosis not present

## 2018-10-16 NOTE — Progress Notes (Signed)
Subjective:  Patient ID: [name], female    DOB: 19-Aug-1950, 68 y.o.   MRN: [id number]  Patient Care Team: Sharion Balloon, FNP as PCP - General (Nurse Practitioner) Mcarthur Rossetti, MD as Consulting Physician (Orthopedic Surgery) Paralee Cancel, MD as Consulting Physician (Orthopedic Surgery)   Chief Complaint:  Nevus (left calf area -leg)   HPI: [name] is a 68 y.o. female presenting on 10/16/2018 for Nevus (left calf area -leg)   Pt presents today for evaluation of an abnormal mole. Pt states her daughter noticed this after her recent breast reduction. States it started as a flat discoloration that has since developed a darker raised area on top of the discolored area. She denies pain or known injury. No weight loss, night sweats, or fevers. States her father has a history of melanoma.     Relevant past medical, surgical, family, and social history reviewed and updated as indicated.  Allergies and medications reviewed and updated. Date reviewed: Chart in Epic.   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     UPPER GASTROINTESTINAL ENDOSCOPY      Social History   Socioeconomic History   Marital status: Widowed    Spouse name: Not on file   Number of children: 2   Years of education: Not on file   Highest education level: High school graduate  Occupational History   Not on file  Social Needs   Financial resource strain: Not hard at all   Food insecurity   Worry: Never true    Inability: Never true   Transportation needs    Medical: No    Non-medical: No  Tobacco Use   Smoking status: Never Smoker   Smokeless tobacco: Never Used  Substance and Sexual Activity   Alcohol use: No   Drug use: No   Sexual activity: Not Currently  Lifestyle   Physical activity    Days per week: 0 days    Minutes per session: 0 min   Stress: Not at all  Relationships   Social connections    Talks on phone: More than three times a week    Gets together: More than three times a week    Attends religious service: Never    Active member of club or organization: No    Attends meetings of clubs or organizations: Never    Relationship status: Widowed   Intimate partner violence    Fear of current or ex partner: No    Emotionally abused: No    Physically abused: No    Forced sexual activity: No  Other Topics Concern   Not on file  Social History Narrative   Not on file    Outpatient Encounter Medications as of 10/16/2018  Medication Sig   Ascorbic Acid (VITAMIN C) 1000 MG tablet Take 1 tablet by mouth 2 (two) times a day.    aspirin 81 MG tablet Take 81 mg by mouth daily.  atorvastatin (LIPITOR) 40 MG tablet Take 1 tablet (40 mg total) by mouth daily. (Needs to be seen before next refill)   gabapentin (NEURONTIN) 100 MG capsule Take 1 capsule (100 mg total) by mouth 2 (two) times daily.   hydrochlorothiazide (HYDRODIURIL) 25 MG tablet Take 1 tablet (25 mg total) by mouth daily. (Needs to be seen before next refill)   metoprolol succinate (TOPROL-XL) 50 MG 24 hr tablet Take 1 tablet (50 mg total) by mouth daily. (Needs to be seen before next refill)   [DISCONTINUED] omeprazole (PRILOSEC) 20 MG capsule Take 20 mg by mouth daily.   No facility-administered encounter medications on file as of 10/16/2018.     No Known Allergies  Review of Systems  Constitutional: Negative for activity change, appetite change, chills, diaphoresis,  fatigue, fever and unexpected weight change.  HENT: Negative.   Eyes: Negative.   Respiratory: Negative for cough, chest tightness and shortness of breath.   Cardiovascular: Negative for chest pain, palpitations and leg swelling.  Gastrointestinal: Negative for blood in stool, constipation, diarrhea, nausea and vomiting.  Endocrine: Negative.   Genitourinary: Negative for decreased urine volume, difficulty urinating, dysuria, frequency and urgency.  Musculoskeletal: Negative for arthralgias and myalgias.  Skin: Positive for color change.       Abnormal lesion  Allergic/Immunologic: Negative.   Neurological: Negative for dizziness, tremors, seizures, syncope, facial asymmetry, speech difficulty, weakness, light-headedness, numbness and headaches.  Hematological: Negative.   Psychiatric/Behavioral: Negative for confusion, hallucinations, sleep disturbance and suicidal ideas.  All other systems reviewed and are negative.       Objective:  BP 125/73    Pulse 80    Temp 98 F (36.7 C)    Resp 20    Ht 5\' 4"  (1.626 m)    Wt 190 lb (86.2 kg)    SpO2 95%    BMI 32.61 kg/m    Wt Readings from Last 3 Encounters:  10/16/18 190 lb (86.2 kg)  10/09/18 190 lb 6.4 oz (86.4 kg)  09/21/18 189 lb 9.6 oz (86 kg)    Physical Exam Vitals signs and nursing note reviewed.  Constitutional:      Appearance: Normal appearance. She is obese.  HENT:     Head: Normocephalic and atraumatic.     Mouth/Throat:     Mouth: Mucous membranes are moist.  Eyes:     Conjunctiva/sclera: Conjunctivae normal.     Pupils: Pupils are equal, round, and reactive to light.  Cardiovascular:     Rate and Rhythm: Normal rate and regular rhythm.     Heart sounds: Normal heart sounds. No murmur. No friction rub. No gallop.   Pulmonary:     Effort: Pulmonary effort is normal. No respiratory distress.     Breath sounds: Normal breath sounds.  Skin:    General: Skin is warm and dry.     Capillary Refill: Capillary refill  takes less than 2 seconds.       Neurological:     General: No focal deficit present.     Mental Status: She is alert and oriented to person, place, and time.  Psychiatric:        Mood and Affect: Mood normal.        Behavior: Behavior normal.        Thought Content: Thought content normal.        Judgment: Judgment normal.         Results for orders placed or performed during the hospital encounter of 08/06/18  SARS Coronavirus 2 (Performed in [facility] hospital lab)   Specimen: Nasal Swab  Result Value Ref Range   SARS Coronavirus 2 NEGATIVE NEGATIVE       Pertinent labs & imaging results that were available during my care of the patient were reviewed by me and considered in my medical decision making.  Assessment & Plan:  Demy was seen today for nevus.  Diagnoses and all orders for this visit:  Atypical mole Family history of melanoma Atypical mole as noted in physical exam. Due to family history of skin cancer, will refer to dermatology for evaluation and treatment.  -     Ambulatory referral to Dermatology     Continue all other maintenance medications.  Follow up plan: Return if symptoms worsen or fail to improve.  Continue healthy lifestyle choices, including diet (rich in fruits, vegetables, and lean proteins, and low in salt and simple carbohydrates) and exercise (at least 30 minutes of moderate physical activity daily).  Educational handout given for mole  The above assessment and management plan was discussed with the patient. The patient verbalized understanding of and has agreed to the management plan. Patient is aware to call the clinic if they develop any new symptoms or if symptoms persist or worsen. Patient is aware when to return to the clinic for a follow-up visit. Patient educated on when it is appropriate to go to the emergency department.   Monia Pouch, FNP-C [facility] [phone]

## 2018-10-16 NOTE — Patient Instructions (Signed)
Mole A mole is a colored (pigmented) growth on the skin. Moles are very common. They are usually harmless, but some moles can become cancerous over time. What are the causes? Moles are caused when pigmented skin cells grow together in clusters instead of spreading out in the skin as they normally do. The reason why the skin cells grow together in clusters is not known. What increases the risk? You are more likely to develop a mole if you:  Have family members who have moles.  Are white.  Have blond hair.  Are often outdoors and exposed to the sun.  Received phototherapy when you were a newborn baby.  Are female. What are the signs or symptoms? A mole may be:  Brown or black.  Flat or raised.  Smooth or wrinkled. How is this diagnosed? A mole is diagnosed with a skin exam. If your health care provider thinks a mole may be cancerous, all or part of the mole will be removed for testing (biopsy). How is this treated? Most moles are noncancerous (benign) and do not require treatment. If a mole is found to be cancerous, it will be removed. You may also choose to have a mole removed if it is causing pain or if you do not like the way it looks. Follow these instructions at home:  General instructions   Every month, look for new moles and check your existing moles for changes. This is important because a change in a mole can mean that the mole has become cancerous.  ABCDE changes in a mole indicate that you should be evaluated by your health care provider. ABCDE stands for: ? Asymmetry. This means the mole has an irregular shape. It is not round or oval. ? Border. This means the mole has an irregular or bumpy border. ? Color. This means the mole has multiple colors in it, including brown, black, blue, red, or tan. Note that it is normal for moles to get darker when a woman is pregnant or takes birth control pills. ? Diameter. This means the mole is more than 0.2 inches (6 mm) across. ?  Evolving. This refers to any unusual changes or symptoms in the mole, such as pain, itching, stinging, sensitivity, or bleeding.  If you have a large number of moles, see a skin doctor (dermatologist) at least one time every year for a full-body skin check. Lifestyle  When you are outdoors, wear sunscreen with SPF 30 (sun protection factor 30) or higher.  Use an adequate amount of sunscreen to cover exposed areas of skin. Put it on 30 minutes before you go out. Reapply it every 2 hours or anytime you come out of the water.  When you are out in the sun, wear a broad-brimmed hat and clothing that covers your arms and legs. Wear wraparound sunglasses. Contact a health care provider if:  The size, shape, borders, or color of your mole changes.  Your mole, or the skin near the mole, becomes painful, sore, red, or swollen.  Your mole: ? Develops more than one color. ? Itches or bleeds. ? Becomes scaly, sheds skin, or oozes fluid. ? Becomes flat or develops raised areas. ? Becomes hard or soft.  You develop a new mole. Summary  A mole is a colored (pigmented) growth on the skin. Moles are very common. They are usually harmless, but some moles can become cancerous over time.  Every month, look for new moles and check your existing moles for changes. This is important   because a change in a mole can mean that the mole has become cancerous.  If you have a large number of moles, see a skin doctor (dermatologist) at least one time every year for a full-body skin check.  When you are outdoors, wear sunscreen with SPF 30 (sun protection factor 30) or higher. Reapply it every 2 hours or anytime you come out of the water.  Contact a health care provider if you notice changes in a mole or if you develop a new mole. This information is not intended to replace advice given to you by your health care provider. Make sure you discuss any questions you have with your health care provider. Document  Released: 09/28/2000 Document Revised: 05/30/2017 Document Reviewed: 05/30/2017 Elsevier Patient Education  2020 [employer]  

## 2018-10-24 ENCOUNTER — Telehealth: Payer: Self-pay

## 2018-10-24 NOTE — Telephone Encounter (Signed)

## 2018-10-25 ENCOUNTER — Encounter: Payer: Self-pay | Admitting: Surgical

## 2018-10-25 ENCOUNTER — Ambulatory Visit (INDEPENDENT_AMBULATORY_CARE_PROVIDER_SITE_OTHER): Payer: Medicare HMO | Admitting: Surgical

## 2018-10-25 DIAGNOSIS — N62 Hypertrophy of breast: Secondary | ICD-10-CM

## 2018-10-25 DIAGNOSIS — N6489 Other specified disorders of breast: Secondary | ICD-10-CM

## 2018-10-25 DIAGNOSIS — M542 Cervicalgia: Secondary | ICD-10-CM

## 2018-10-25 DIAGNOSIS — G8929 Other chronic pain: Secondary | ICD-10-CM

## 2018-10-25 DIAGNOSIS — M546 Pain in thoracic spine: Secondary | ICD-10-CM

## 2018-10-25 NOTE — Progress Notes (Deleted)
No show

## 2018-11-08 ENCOUNTER — Telehealth: Payer: Self-pay | Admitting: Plastic Surgery

## 2018-11-08 NOTE — Telephone Encounter (Signed)

## 2018-11-09 ENCOUNTER — Ambulatory Visit (INDEPENDENT_AMBULATORY_CARE_PROVIDER_SITE_OTHER): Payer: Medicare HMO | Admitting: Surgical

## 2018-11-09 ENCOUNTER — Other Ambulatory Visit: Payer: Self-pay

## 2018-11-09 ENCOUNTER — Encounter: Payer: Self-pay | Admitting: Surgical

## 2018-11-09 VITALS — BP 132/78 | HR 86 | Temp 98.2°F | Ht 64.0 in | Wt 189.8 lb

## 2018-11-09 DIAGNOSIS — G8929 Other chronic pain: Secondary | ICD-10-CM

## 2018-11-09 DIAGNOSIS — N62 Hypertrophy of breast: Secondary | ICD-10-CM

## 2018-11-09 DIAGNOSIS — N6489 Other specified disorders of breast: Secondary | ICD-10-CM

## 2018-11-09 DIAGNOSIS — M546 Pain in thoracic spine: Secondary | ICD-10-CM

## 2018-11-09 DIAGNOSIS — M542 Cervicalgia: Secondary | ICD-10-CM

## 2018-11-09 NOTE — Progress Notes (Signed)
   Subjective:     Patient ID: [name], female    DOB: [birth year], 68 y.o.   MRN: [id number]  Chief Complaint  Patient presents with  . Follow-up    2 weeks    HPI: The patient is a 68 y.o. female here for follow-up after b/l breast reduction ~ 3 months ago.  She reports her left breast swelling has significantly improved but she has noticed continued breast pain in areas that are hard. She has some areas of fibrosis and scarring - right and left side. She reports she has been massaging the area, with minimal improvement.  She would like to find out about possible ways to improve this pain and also improve symmetry of her breasts due to post-op scarring.  No recent fever, chills, n/v. No sign of infection. Incisions healing well.  She reports she has been putting off hip replacement surgery for multiple years and was waiting for her breasts to heal prior to beginning hip replacement surgery. She is going to think about which concerns her more and affects her daily life/pain more and we will talk more about it at her follow up.  Review of Systems  Constitutional: Positive for activity change. Negative for appetite change, chills, diaphoresis, fatigue and fever.  Respiratory: Negative for shortness of breath.   Cardiovascular: Negative for chest pain, palpitations and leg swelling.  Gastrointestinal: Negative for abdominal pain, nausea and vomiting.  Musculoskeletal: Negative for back pain and neck pain.  Skin: Negative for pallor, rash and wound.  Neurological: Negative for dizziness, weakness and headaches.       + sensory change      Objective:   Vital Signs BP 132/78 (BP Location: Left Arm, Patient Position: Sitting, Cuff Size: Normal)   Pulse 86   Temp 98.2 F (36.8 C) (Temporal)   Ht 5\' 4"  (1.626 m)   Wt 189 lb 12.8 oz (86.1 kg)   SpO2 97%   BMI 32.58 kg/m  Vital Signs and Nursing Note Reviewed  Physical Exam  Constitutional: She is oriented to person,  place, and time and well-developed, well-nourished, and in no distress.  HENT:  Head: Normocephalic and atraumatic.  Cardiovascular: Normal rate.  Pulmonary/Chest: Effort normal. Right breast exhibits tenderness. Left breast exhibits tenderness. Breasts are asymmetrical.    Bilateral breast incisions c/d/i, well healed. No open wounds noted.  Musculoskeletal: Normal range of motion.  Neurological: She is alert and oriented to person, place, and time. Gait normal.  Skin: Skin is warm and dry. No rash noted. She is not diaphoretic. No erythema. No pallor.  Psychiatric: Mood and affect normal.      Assessment/Plan:     ICD-10-CM   1. Seroma of breast  N64.89   2. Symptomatic mammary hypertrophy  N62   3. Chronic bilateral thoracic back pain  M54.6    G89.29   4. Neck pain  M54.2    Will consult with Dr. Marla Roe in regards to further management in regards to scar contracture release and excision of fat necrosis.  Continue to massage the areas of hardness. Healthy diet.  Call with any questions or concerns.  Carola Rhine Margareth Kanner, PA-C 11/09/2018, 3:02 PM

## 2018-11-16 DIAGNOSIS — R69 Illness, unspecified: Secondary | ICD-10-CM | POA: Diagnosis not present

## 2018-11-29 ENCOUNTER — Telehealth: Payer: Self-pay | Admitting: Family

## 2018-11-29 NOTE — Telephone Encounter (Signed)
Letter mailed to Patient with appointment information.

## 2018-12-07 DIAGNOSIS — D485 Neoplasm of uncertain behavior of skin: Secondary | ICD-10-CM | POA: Diagnosis not present

## 2018-12-07 DIAGNOSIS — D1801 Hemangioma of skin and subcutaneous tissue: Secondary | ICD-10-CM | POA: Diagnosis not present

## 2018-12-07 DIAGNOSIS — D225 Melanocytic nevi of trunk: Secondary | ICD-10-CM | POA: Diagnosis not present

## 2018-12-07 DIAGNOSIS — L821 Other seborrheic keratosis: Secondary | ICD-10-CM | POA: Diagnosis not present

## 2018-12-24 ENCOUNTER — Other Ambulatory Visit: Payer: Self-pay | Admitting: *Deleted

## 2018-12-24 DIAGNOSIS — M9901 Segmental and somatic dysfunction of cervical region: Secondary | ICD-10-CM

## 2018-12-25 DIAGNOSIS — M25552 Pain in left hip: Secondary | ICD-10-CM | POA: Diagnosis not present

## 2018-12-25 DIAGNOSIS — M25551 Pain in right hip: Secondary | ICD-10-CM | POA: Diagnosis not present

## 2019-01-01 ENCOUNTER — Telehealth: Payer: Self-pay | Admitting: Plastic Surgery

## 2019-01-01 ENCOUNTER — Ambulatory Visit: Payer: Medicare HMO

## 2019-01-01 NOTE — Telephone Encounter (Signed)
Error; sent staff message instead

## 2019-01-04 ENCOUNTER — Telehealth (INDEPENDENT_AMBULATORY_CARE_PROVIDER_SITE_OTHER): Payer: Medicare HMO | Admitting: Plastic Surgery

## 2019-01-04 ENCOUNTER — Other Ambulatory Visit: Payer: Self-pay

## 2019-01-04 ENCOUNTER — Encounter: Payer: Self-pay | Admitting: Plastic Surgery

## 2019-01-04 DIAGNOSIS — N6489 Other specified disorders of breast: Secondary | ICD-10-CM

## 2019-01-04 DIAGNOSIS — G8929 Other chronic pain: Secondary | ICD-10-CM

## 2019-01-04 DIAGNOSIS — M546 Pain in thoracic spine: Secondary | ICD-10-CM

## 2019-01-04 DIAGNOSIS — N62 Hypertrophy of breast: Secondary | ICD-10-CM

## 2019-01-04 DIAGNOSIS — M542 Cervicalgia: Secondary | ICD-10-CM

## 2019-01-04 NOTE — Progress Notes (Signed)
The patient is a 68 year old female joining me by a telemetry visit for discussion of her breast reduction.  She underwent a breast reduction over 6 months ago.  She had seroma buildup afterwards and required drainage several times.  This likely led to some fat necrosis.  She has some firm areas that she does not like.  She finds it very aggravating.  She tries to massage it that does help some and then the firmness comes back again.  She is interested in having it evaluated for possible revision.  She does not want to do anything right now but would like to see if she could possibly do that next year.  We have agreed that we could do some releasing of the contracture this would likely help quite a bit.  She is aware to give [place] a call after the first of the year and we will get her into the office for further evaluation.  The patient gave consent to have this visit done by telemedicine / virtual visit.  This is also consent for access the chart and treat the patient via this visit. The patient is located at home.  I, the provider, am at the office.  We spent 5 minutes together for the visit.  The [place] was signed into law in 2016 which includes the topic of electronic health records.  This provides immediate access to information in MyChart.  This includes consultation notes, operative notes, office notes, lab results and pathology reports.  If you have any questions about what you read please let us know at your next visit or call us at the office.  We are right here with you.

## 2019-01-16 DIAGNOSIS — M25551 Pain in right hip: Secondary | ICD-10-CM | POA: Diagnosis not present

## 2019-01-16 DIAGNOSIS — M1612 Unilateral primary osteoarthritis, left hip: Secondary | ICD-10-CM | POA: Diagnosis not present

## 2019-01-16 DIAGNOSIS — M25552 Pain in left hip: Secondary | ICD-10-CM | POA: Diagnosis not present

## 2019-01-16 DIAGNOSIS — M160 Bilateral primary osteoarthritis of hip: Secondary | ICD-10-CM | POA: Diagnosis not present

## 2019-01-16 DIAGNOSIS — M1611 Unilateral primary osteoarthritis, right hip: Secondary | ICD-10-CM | POA: Diagnosis not present

## 2019-01-30 DIAGNOSIS — Z0100 Encounter for examination of eyes and vision without abnormal findings: Secondary | ICD-10-CM | POA: Diagnosis not present

## 2019-01-30 DIAGNOSIS — H5200 Hypermetropia, unspecified eye: Secondary | ICD-10-CM | POA: Diagnosis not present

## 2019-03-28 NOTE — H&P (Signed)
TOTAL HIP ADMISSION H&P  Patient is admitted for right total hip arthroplasty, anterior approach  Subjective:  Chief Complaint:   Right hip primary OA / pain  HPI: [name], 69 y.o. female, has a history of pain and functional disability in the right hip(s) due to arthritis and patient has failed non-surgical conservative treatments for greater than 12 weeks to include NSAID's and/or analgesics, corticosteriod injections, use of assistive devices and activity modification.  Onset of symptoms was gradual starting >10 years ago with gradually worsening course since that time.The patient noted no past surgery on the right hip(s).  Patient currently rates pain in the right hip at 9 out of 10 with activity. Patient has worsening of pain with activity and weight bearing, trendelenberg gait, pain that interfers with activities of daily living and pain with passive range of motion. Patient has evidence of periarticular osteophytes and joint space narrowing by imaging studies. This condition presents safety issues increasing the risk of falls.  There is no current active infection.  Risks, benefits and expectations were discussed with the patient.  Risks including but not limited to the risk of anesthesia, blood clots, nerve damage, blood vessel damage, failure of the prosthesis, infection and up to and including death.  Patient understand the risks, benefits and expectations and wishes to proceed with surgery.   PCP: Sharion Balloon, FNP  D/C Plans:       Home   Post-op Meds:       No Rx given   Tranexamic Acid:      To be given - IV   Decadron:      Is to be given  FYI:      ASA  [employer]  DME:   Pt already has equipment   PT:   [facility]  [facility]acy: [facility] [place]    Patient Active Problem List   Diagnosis Date Noted  . Family history of melanoma 10/16/2018  . Atypical mole 10/16/2018  . Symptomatic mammary hypertrophy 06/01/2018  . Chronic bilateral thoracic back pain 06/01/2018   . Neck pain 06/01/2018  . Metabolic syndrome [id number]  . Unilateral primary osteoarthritis, left knee 02/02/2016  . Unilateral primary osteoarthritis, right hip 02/02/2016  . Obesity (BMI 30-39.9) 11/12/2015  . Vitamin D deficiency 02/24/2014  . Essential hypertension 02/21/2014  . Hyperlipidemia 02/21/2014  . GERD (gastroesophageal reflux disease) 02/21/2014   Past Medical History:  Diagnosis Date  . Fibromyalgia   . GERD (gastroesophageal reflux disease)   . Hyperlipidemia   . Hypertension     Past Surgical History:  Procedure Laterality Date  . BREAST REDUCTION SURGERY Bilateral 08/09/2018   Procedure: MAMMARY REDUCTION  (BREAST);  Surgeon: Wallace Going, DO;  Location: [facility];  Service: Plastics;  Laterality: Bilateral;  . CARPAL TUNNEL RELEASE Bilateral   . CHOLECYSTECTOMY    . COLONOSCOPY  2001  . TUBAL LIGATION    . 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    . UPPER GASTROINTESTINAL ENDOSCOPY      No current facility-administered medications for this encounter.   Current Outpatient Medications  Medication Sig Dispense Refill Last Dose  . Ascorbic Acid (VITAMIN C) 1000 MG tablet Take 1 tablet by mouth 2 (two) times a day.      Marland Kitchen aspirin 81 MG tablet Take 81 mg by mouth daily.      Marland Kitchen atorvastatin (LIPITOR) 40 MG tablet Take 1 tablet (40 mg total) by mouth daily. (Needs to be seen before next refill) 90  tablet 3   . gabapentin (NEURONTIN) 100 MG capsule Take 1 capsule (100 mg total) by mouth 2 (two) times daily. 180 capsule 3   . hydrochlorothiazide (HYDRODIURIL) 25 MG tablet Take 1 tablet (25 mg total) by mouth daily. (Needs to be seen before next refill) 90 tablet 3   . metoprolol succinate (TOPROL-XL) 50 MG 24 hr tablet Take 1 tablet (50 mg total) by mouth daily. (Needs to be seen before next refill) 90 tablet 3    No Known Allergies   Social History   Tobacco Use  . Smoking status: Never Smoker  . Smokeless tobacco: Never Used  Substance Use Topics   . Alcohol use: No    Family History  Problem Relation Age of Onset  . Hypertension Father   . Heart disease Father   . Cancer Father   . Hypertension Mother   . Heart disease Mother   . Diabetes Mother   . Hypertension Brother   . Hypertension Sister   . Colon cancer Neg Hx   . Breast cancer Neg Hx      Review of Systems  Constitutional: Negative.   HENT: Negative.   Eyes: Negative.   Respiratory: Negative.   Cardiovascular: Negative.   Gastrointestinal: Positive for heartburn.  Genitourinary: Negative.   Musculoskeletal: Positive for joint pain.  Skin: Negative.   Neurological: Negative.   Endo/Heme/Allergies: Negative.   Psychiatric/Behavioral: Negative.     Objective:  Physical Exam  Constitutional: She is oriented to person, place, and time. She appears well-developed.  HENT:  Head: Normocephalic.  Eyes: Pupils are equal, round, and reactive to light.  Neck: No JVD present. No tracheal deviation present. No thyromegaly present.  Cardiovascular: Normal rate, regular rhythm and intact distal pulses.  Respiratory: Effort normal and breath sounds normal. No respiratory distress. She has no wheezes.  GI: Soft. There is no abdominal tenderness. There is no guarding.  Musculoskeletal:     Cervical back: Neck supple.     Right hip: Tenderness and bony tenderness present. No swelling, deformity or lacerations. Decreased range of motion. Decreased strength.  Lymphadenopathy:    She has no cervical adenopathy.  Neurological: She is alert and oriented to person, place, and time. A sensory deficit (occasional numbness in LEs) is present.  Skin: Skin is warm and dry.  Psychiatric: She has a normal mood and affect.      Labs:  Estimated body mass index is 32.58 kg/m as calculated from the following:   Height as of 11/09/18: 5\' 4"  (1.626 m).   Weight as of 11/09/18: 86.1 kg.   Imaging Review Plain radiographs demonstrate severe degenerative joint disease of the  right hip. The bone quality appears to be good for age and reported activity level.      Assessment/Plan:  End stage arthritis, right hip  The patient history, physical examination, clinical judgement of the provider and imaging studies are consistent with end stage degenerative joint disease of the right hip and total hip arthroplasty is deemed medically necessary. The treatment options including medical management, injection therapy, arthroscopy and arthroplasty were discussed at length. The risks and benefits of total hip arthroplasty were presented and reviewed. The risks due to aseptic loosening, infection, stiffness, dislocation/subluxation,  thromboembolic complications and other imponderables were discussed.  The patient acknowledged the explanation, agreed to proceed with the plan and consent was signed. Patient is being admitted for treatment for surgery, pain control, PT, OT, prophylactic antibiotics, VTE prophylaxis, progressive ambulation and ADL's and discharge  planning.The patient is planning to be discharged home.    West Pugh Imanii Gosdin   PA-C  03/28/2019, 1:55 PM

## 2019-03-29 ENCOUNTER — Ambulatory Visit: Payer: Medicare HMO | Admitting: Plastic Surgery

## 2019-04-02 ENCOUNTER — Encounter: Payer: Self-pay | Admitting: Family

## 2019-04-02 ENCOUNTER — Ambulatory Visit (INDEPENDENT_AMBULATORY_CARE_PROVIDER_SITE_OTHER): Payer: Medicare HMO | Admitting: Family

## 2019-04-02 ENCOUNTER — Other Ambulatory Visit: Payer: Self-pay

## 2019-04-02 ENCOUNTER — Ambulatory Visit (INDEPENDENT_AMBULATORY_CARE_PROVIDER_SITE_OTHER): Payer: Medicare HMO

## 2019-04-02 VITALS — BP 138/74 | HR 75 | Temp 97.2°F | Ht 64.0 in | Wt 195.0 lb

## 2019-04-02 DIAGNOSIS — M25551 Pain in right hip: Secondary | ICD-10-CM | POA: Diagnosis not present

## 2019-04-02 DIAGNOSIS — M25552 Pain in left hip: Secondary | ICD-10-CM

## 2019-04-02 DIAGNOSIS — Z01818 Encounter for other preprocedural examination: Secondary | ICD-10-CM

## 2019-04-02 DIAGNOSIS — J40 Bronchitis, not specified as acute or chronic: Secondary | ICD-10-CM | POA: Diagnosis not present

## 2019-04-02 NOTE — Progress Notes (Signed)
   Subjective:    Patient ID: [name], female    DOB: [birth year], 69 y.o.   MRN: [id number]  Chief Complaint  Patient presents with  . SURGICAL CLEARANCE   Pt presents to the office today for surgical clearance for right total hip replacement on March 23. Then April 27 for a left total hip replacement.  Hip Pain  The incident occurred more than 1 week ago. The pain is present in the right hip and left hip. The quality of the pain is described as aching. The pain is at a severity of 8/10. The pain is moderate. The pain has been intermittent since onset. She reports no foreign bodies present. The symptoms are aggravated by movement and weight bearing. She has tried NSAIDs and rest for the symptoms. The treatment provided mild relief.      Review of Systems  All other systems reviewed and are negative.      Objective:   Physical Exam Vitals reviewed.  Constitutional:      General: She is not in acute distress.    Appearance: She is well-developed.  HENT:     Head: Normocephalic and atraumatic.     Right Ear: Tympanic membrane normal.     Left Ear: Tympanic membrane normal.  Eyes:     Pupils: Pupils are equal, round, and reactive to light.  Neck:     Thyroid: No thyromegaly.  Cardiovascular:     Rate and Rhythm: Normal rate and regular rhythm.     Heart sounds: Normal heart sounds. No murmur.  Pulmonary:     Effort: Pulmonary effort is normal. No respiratory distress.     Breath sounds: Normal breath sounds. No wheezing.  Abdominal:     General: Bowel sounds are normal. There is no distension.     Palpations: Abdomen is soft.     Tenderness: There is no abdominal tenderness.  Musculoskeletal:        General: Tenderness present.     Cervical back: Normal range of motion and neck supple.     Comments: Pain in bilateral hips with internal and external rotation  Skin:    General: Skin is warm and dry.  Neurological:     Mental Status: She is alert and oriented to  person, place, and time.     Cranial Nerves: No cranial nerve deficit.     Deep Tendon Reflexes: Reflexes are normal and symmetric.  Psychiatric:        Behavior: Behavior normal.        Thought Content: Thought content normal.        Judgment: Judgment normal.     BP 138/74   Pulse 75   Temp (!) 97.2 F (36.2 C) (Oral)   Ht '5\' 4"'$  (1.626 m)   Wt 195 lb (88.5 kg)   SpO2 98%   BMI 33.47 kg/m      Assessment & Plan:  [name] comes in today with chief complaint of SURGICAL CLEARANCE   Diagnosis and orders addressed:  1. Pre-op exam - CMP14+EGFR - CBC with Differential/Platelet - DG Chest 2 View; Future - EKG 12-Lead  2. Bilateral hip pain - CBC with Differential/Platelet - DG Chest 2 View; Future - EKG 12-Lead   Labs pending Health Maintenance reviewed Diet and exercise encouraged  Follow up plan: Keep referral with Ortho, exercises discussed   Evelina Dun, FNP

## 2019-04-02 NOTE — Patient Instructions (Signed)
Total Hip Replacement, Anterior, Care After This sheet gives you information about how to care for yourself after your procedure. Your health care provider may also give you more specific instructions. If you have problems or questions, contact your health care provider. What can I expect after the procedure? After the procedure, it is common to have:  Pain.  Stiffness.  Discomfort. Follow these instructions at home: Medicines  Take over-the-counter and prescription medicines only as told by your health care provider.  If you were prescribed a blood thinner (anticoagulant) to help prevent blood clots, take it as told by your health care provider. Incision care   Follow instructions from your health care provider about how to take care of your incision. Make sure you: ? Wash your hands with soap and water before you change your bandage (dressing). If soap and water are not available, use hand sanitizer. ? Change your dressing as told by your health care provider. ? Leave stitches (sutures), skin glue, or adhesive strips in place. These skin closures may need to stay in place for 2 weeks or longer. If adhesive strip edges start to loosen and curl up, you may trim the loose edges. Do not remove adhesive strips completely unless your health care provider tells you to do that.  Check your incision area every day for signs of infection. Check for: ? Redness, swelling, or pain. ? Fluid or blood. ? Warmth. ? Pus or a bad smell. Bathing  Do not take baths, swim, or use a hot tub until your health care provider approves. Ask your health care provider if you may take showers. You may only be allowed to take sponge baths.  Keep the dressing dry until your health care provider says it can be removed. Managing pain, stiffness, and swelling   If directed, put ice on the affected area. ? Put ice in a plastic bag. ? Place a towel between your skin and the bag. ? Leave the ice on for 20  minutes, 2-3 times a day.  Move your toes often to avoid stiffness and to lessen swelling.  Raise (elevate) your leg above the level of your heart while you are sitting or lying down. Activity  Rest as told by your health care provider.  Avoid sitting for a long time without moving. Get up to take short walks every 1-2 hours. This is important to improve blood flow and breathing. Ask for help if you feel weak or unsteady.  Do exercises as told by your health care provider or physical therapist.  Follow instructions from your health care provider about using a B[place], crutches, or a cane. ? You may use your legs to support (bear) your body weight as told by your health care provider. Follow instructions about how much weight you may safely support on your affected leg (weight-bearing restrictions). ? A physical therapist may show you how to get out of a bed and chair and how to go up and down stairs. You will first do this with a [place], crutches, or a cane and then without any of these devices. ? Once you are able to walk without a limp, you may stop using a [place], crutches, or cane.  Return to your normal activities as told by your health care provider. Ask your health care provider what activities are safe for you. Safety  To help prevent falls, keep floors clear of objects you may trip over, and place items that you may need within easy reach.  Wear  an apron or tool belt with pockets for carrying objects. This leaves your hands free to help with your balance. Driving  Do not drive or use heavy machinery while taking prescription pain medicine.  Ask your health care provider when it is safe to drive. General instructions  Wear compression stockings as told by your health care provider. These stockings help to prevent blood clots and reduce swelling in your legs.  Continue with breathing exercises as directed by your health care provider. This helps prevent lung infection.  If  you are taking prescription pain medicine, take actions to prevent or treat constipation. Your health care provider may recommend that you: ? Drink enough fluid to keep your urine pale yellow. ? Eat foods that are high in fiber, such as fresh fruits and vegetables, whole grains, and beans. ? Limit foods that are high in fat and processed sugars, such as fried or sweet foods. ? Take an over-the-counter or prescription medicine for constipation.  Do not use any products that contain nicotine or tobacco, such as cigarettes and e-cigarettes. These can delay bone healing. If you need help quitting, ask your health care provider.  Tell your health care provider if you plan to have dental work. Also: ? Tell your dentist about your joint replacement. ? Ask your health care provider if there are any special instructions you need to follow before having dental care and routine cleanings.  Keep all follow-up visits as told by your health care provider. This is important. Contact a health care provider if:  You have a fever or chills.  You have a cough or feel short of breath.  Your medicine is not controlling your pain.  You have redness, swelling, or pain around your incision.  You have fluid or blood coming from your incision.  Your incision feels warm to the touch.  You have pus or a bad smell coming from your incision. Get help right away if you have:  Severe pain.  Trouble breathing.  Chest pain.  Redness, swelling, pain, and warmth in your calf or leg. Summary  Follow instructions from your health care provider about how to take care of your incision.  Do not take baths, swim, or use a hot tub until your health care provider approves.  Use crutches, a Tobia, or a cane as told by your health care provider.  If you were prescribed a blood thinner (anticoagulant) to help prevent blood clots, take it as told by your health care provider. This information is not intended to  replace advice given to you by your health care provider. Make sure you discuss any questions you have with your health care provider. Document Revised: 05/14/2018 Document Reviewed: 04/19/2017 Elsevier Patient Education  [place].

## 2019-04-02 NOTE — Progress Notes (Addendum)
PCP - Sharion Balloon, FNP Cardiologist -   Chest x-ray -  EKG - 04-03-19  Labs- CBC w/Diff, and CMP  04-02-19  Stress Test -  ECHO -  Cardiac Cath -   Sleep Study -  CPAP -   Fasting Blood Sugar -  Checks Blood Sugar _____ times a day  Blood Thinner Instructions: Aspirin Instructions: Last Dose:  Anesthesia review:   Patient denies shortness of breath, fever, cough and chest pain at PAT appointment   Patient verbalized understanding of instructions that were given to them at the PAT appointment. Patient was also instructed that they will need to review over the PAT instructions again at home before surgery.

## 2019-04-02 NOTE — Patient Instructions (Addendum)
DUE TO COVID-19 ONLY ONE VISITOR IS ALLOWED TO COME WITH YOU AND STAY IN THE WAITING ROOM ONLY DURING PRE OP AND PROCEDURE DAY OF SURGERY. THE 1 VISITOR MAY VISIT WITH YOU AFTER SURGERY IN YOUR PRIVATE ROOM DURING VISITING HOURS ONLY!  YOU NEED TO HAVE A COVID 19 TEST ON 04-05-19 @ 11:10 AM, THIS TEST MUST BE DONE BEFORE SURGERY, COME  [address], [place] , [id number].  ([place]) ONCE YOUR COVID TEST IS COMPLETED, PLEASE BEGIN THE QUARANTINE INSTRUCTIONS AS OUTLINED IN YOUR HANDOUT.                SHERYLYN BLICK  04/02/2019   Your procedure is scheduled on: 04-09-19   Report to [facility] Main  Entrance    Report to Admitting at 6:10 AM     Call this number if you have problems the morning of surgery [phone]    Remember: NO SOLID FOOD AFTER MIDNIGHT THE NIGHT PRIOR TO SURGERY. NOTHING BY MOUTH EXCEPT CLEAR LIQUIDS UNTIL 5:40 am. PLEASE FINISH ENSURE DRINK PER SURGEON ORDER  WHICH NEEDS TO BE COMPLETED AT 5:40 am .   CLEAR LIQUID DIET   Foods Allowed                                                                     Foods Excluded  Coffee and tea, regular and decaf                             liquids that you cannot  Plain Jell-O any favor except red or purple                                           see through such as: Fruit ices (not with fruit pulp)                                     milk, soups, orange juice  Iced Popsicles                                    All solid food Carbonated beverages, regular and diet                                    Cranberry, grape and apple juices Sports drinks like Gatorade Lightly seasoned clear broth or consume(fat free) Sugar, honey syrup   _____________________________________________________________________     Take these medicines the morning of surgery with A SIP OF WATER: Metoprolol Succinate (Toprol-XL), and Omeprazole (Prilosec). You may also use and bring your nasal spray and eye  drops  BRUSH YOUR TEETH MORNING OF SURGERY AND RINSE YOUR MOUTH OUT, NO CHEWING GUM CANDY OR MINTS.  You may not have any metal on your body including hair pins and              piercings    Do not wear jewelry, make-up, lotions, powders or perfumes, deodorant              Do not wear nail polish on your fingernails.  Do not shave  48 hours prior to surgery.            Do not bring valuables to the hospital. [place].  Contacts, dentures or bridgework may not be worn into surgery.  You may bring an overnight bag     Special Instructions: N/A              Please read over the following fact sheets you were given: _____________________________________________________________________             [facility] - Preparing for Surgery Before surgery, you can play an important role.  Because skin is not sterile, your skin needs to be as free of germs as possible.  You can reduce the number of germs on your skin by washing with CHG (chlorahexidine gluconate) soap before surgery.  CHG is an antiseptic cleaner which kills germs and bonds with the skin to continue killing germs even after washing. Please DO NOT use if you have an allergy to CHG or antibacterial soaps.  If your skin becomes reddened/irritated stop using the CHG and inform your nurse when you arrive at Short Stay. Do not shave (including legs and underarms) for at least 48 hours prior to the first CHG shower.  You may shave your face/neck. Please follow these instructions carefully:  1.  Shower with CHG Soap the night before surgery and the  morning of Surgery.  2.  If you choose to wash your hair, wash your hair first as usual with your  normal  shampoo.  3.  After you shampoo, rinse your hair and body thoroughly to remove the  shampoo.                           4.  Use CHG as you would any other liquid soap.  You can apply chg directly  to the skin  and wash                       Gently with a scrungie or clean washcloth.  5.  Apply the CHG Soap to your body ONLY FROM THE NECK DOWN.   Do not use on face/ open                           Wound or open sores. Avoid contact with eyes, ears mouth and genitals (private parts).                       Wash face,  Genitals (private parts) with your normal soap.             6.  Wash thoroughly, paying special attention to the area where your surgery  will be performed.  7.  Thoroughly rinse your body with warm water from the neck down.  8.  DO NOT shower/wash with your normal soap after using and rinsing off  the CHG Soap.  9.  Pat yourself dry with a clean towel.            10.  Wear clean pajamas.            11.  Place clean sheets on your bed the night of your first shower and do not  sleep with pets. Day of Surgery : Do not apply any lotions/deodorants the morning of surgery.  Please wear clean clothes to the hospital/surgery center.  FAILURE TO FOLLOW THESE INSTRUCTIONS MAY RESULT IN THE CANCELLATION OF YOUR SURGERY PATIENT SIGNATURE_________________________________  NURSE SIGNATURE__________________________________  ________________________________________________________________________   Adam Phenix  An incentive spirometer is a tool that can help keep your lungs clear and active. This tool measures how well you are filling your lungs with each breath. Taking long deep breaths may help reverse or decrease the chance of developing breathing (pulmonary) problems (especially infection) following:  A long period of time when you are unable to move or be active. BEFORE THE PROCEDURE   If the spirometer includes an indicator to show your best effort, your nurse or respiratory therapist will set it to a desired goal.  If possible, sit up straight or lean slightly forward. Try not to slouch.  Hold the incentive spirometer in an upright position. INSTRUCTIONS FOR USE   1. Sit on the edge of your bed if possible, or sit up as far as you can in bed or on a chair. 2. Hold the incentive spirometer in an upright position. 3. Breathe out normally. 4. Place the mouthpiece in your mouth and seal your lips tightly around it. 5. Breathe in slowly and as deeply as possible, raising the piston or the ball toward the top of the column. 6. Hold your breath for 3-5 seconds or for as long as possible. Allow the piston or ball to fall to the bottom of the column. 7. Remove the mouthpiece from your mouth and breathe out normally. 8. Rest for a few seconds and repeat Steps 1 through 7 at least 10 times every 1-2 hours when you are awake. Take your time and take a few normal breaths between deep breaths. 9. The spirometer may include an indicator to show your best effort. Use the indicator as a goal to work toward during each repetition. 10. After each set of 10 deep breaths, practice coughing to be sure your lungs are clear. If you have an incision (the cut made at the time of surgery), support your incision when coughing by placing a pillow or rolled up towels firmly against it. Once you are able to get out of bed, walk around indoors and cough well. You may stop using the incentive spirometer when instructed by your caregiver.  RISKS AND COMPLICATIONS  Take your time so you do not get dizzy or light-headed.  If you are in pain, you may need to take or ask for pain medication before doing incentive spirometry. It is harder to take a deep breath if you are having pain. AFTER USE  Rest and breathe slowly and easily.  It can be helpful to keep track of a log of your progress. Your caregiver can provide you with a simple table to help with this. If you are using the spirometer at home, follow these instructions: [place] IF:   You are having difficultly using the spirometer.  You have trouble using the spirometer as often as instructed.  Your pain medication is  not giving enough relief while using the spirometer.  You  develop fever of 100.5 F (38.1 C) or higher. SEEK IMMEDIATE MEDICAL CARE IF:   You cough up bloody sputum that had not been present before.  You develop fever of 102 F (38.9 C) or greater.  You develop worsening pain at or near the incision site. MAKE SURE YOU:   Understand these instructions.  Will watch your condition.  Will get help right away if you are not doing well or get worse. Document Released: 05/16/2006 Document Revised: 03/28/2011 Document Reviewed: 07/17/2006 ExitCare Patient Information 2014 Exit[employer], [place].   ________________________________________________________________________  WHAT IS A BLOOD TRANSFUSION? Blood Transfusion Information  A transfusion is the replacement of blood or some of its parts. Blood is made up of multiple cells which provide different functions.  Red blood cells carry oxygen and are used for blood loss replacement.  White blood cells fight against infection.  Platelets control bleeding.  Plasma helps clot blood.  Other blood products are available for specialized needs, such as hemophilia or other clotting disorders. BEFORE THE TRANSFUSION  Who gives blood for transfusions?   Healthy volunteers who are fully evaluated to make sure their blood is safe. This is blood bank blood. Transfusion therapy is the safest it has ever been in the practice of medicine. Before blood is taken from a donor, a complete history is taken to make sure that person has no history of diseases nor engages in risky social behavior (examples are intravenous drug use or sexual activity with multiple partners). The donor's travel history is screened to minimize risk of transmitting infections, such as malaria. The donated blood is tested for signs of infectious diseases, such as HIV and hepatitis. The blood is then tested to be sure it is compatible with you in order to minimize the chance of a  transfusion reaction. If you or a relative donates blood, this is often done in anticipation of surgery and is not appropriate for emergency situations. It takes many days to process the donated blood. RISKS AND COMPLICATIONS Although transfusion therapy is very safe and saves many lives, the main dangers of transfusion include:   Getting an infectious disease.  Developing a transfusion reaction. This is an allergic reaction to something in the blood you were given. Every precaution is taken to prevent this. The decision to have a blood transfusion has been considered carefully by your caregiver before blood is given. Blood is not given unless the benefits outweigh the risks. AFTER THE TRANSFUSION  Right after receiving a blood transfusion, you will usually feel much better and more energetic. This is especially true if your red blood cells have gotten low (anemic). The transfusion raises the level of the red blood cells which carry oxygen, and this usually causes an energy increase.  The nurse administering the transfusion will monitor you carefully for complications. HOME CARE INSTRUCTIONS  No special instructions are needed after a transfusion. You may find your energy is better. Speak with your caregiver about any limitations on activity for underlying diseases you may have. SEEK MEDICAL CARE IF:   Your condition is not improving after your transfusion.  You develop redness or irritation at the intravenous (IV) site. SEEK IMMEDIATE MEDICAL CARE IF:  Any of the following symptoms occur over the next 12 hours:  Shaking chills.  You have a temperature by mouth above 102 F (38.9 C), not controlled by medicine.  Chest, back, or muscle pain.  People around you feel you are not acting correctly or are confused.  Shortness of breath  or difficulty breathing.  Dizziness and fainting.  You get a rash or develop hives.  You have a decrease in urine output.  Your urine turns a dark  color or changes to pink, red, or brown. Any of the following symptoms occur over the next 10 days:  You have a temperature by mouth above 102 F (38.9 C), not controlled by medicine.  Shortness of breath.  Weakness after normal activity.  The white part of the eye turns yellow (jaundice).  You have a decrease in the amount of urine or are urinating less often.  Your urine turns a dark color or changes to pink, red, or brown. Document Released: 01/01/2000 Document Revised: 03/28/2011 Document Reviewed: 08/20/2007 [facility] Patient Information 2014 [place], [place].  _______________________________________________________________________

## 2019-04-03 ENCOUNTER — Encounter (HOSPITAL_COMMUNITY)
Admission: RE | Admit: 2019-04-03 | Discharge: 2019-04-03 | Disposition: A | Discharge status: Home / Self Care | Payer: Medicare HMO | Source: Ambulatory Visit | Attending: Orthopedic Surgery | Admitting: Orthopedic Surgery

## 2019-04-03 ENCOUNTER — Encounter (HOSPITAL_COMMUNITY): Payer: Self-pay

## 2019-04-03 ENCOUNTER — Other Ambulatory Visit: Payer: Self-pay

## 2019-04-03 ENCOUNTER — Telehealth: Payer: Self-pay | Admitting: Family

## 2019-04-03 DIAGNOSIS — Z01812 Encounter for preprocedural laboratory examination: Secondary | ICD-10-CM | POA: Insufficient documentation

## 2019-04-03 LAB — CBC WITH DIFFERENTIAL/PLATELET
Basophils Absolute: 0 10*3/uL (ref 0.0–0.2)
Basos: 0 %
EOS (ABSOLUTE): 0.2 10*3/uL (ref 0.0–0.4)
Eos: 2 %
Hematocrit: 42.1 % (ref 34.0–46.6)
Hemoglobin: 14.2 g/dL (ref 11.1–15.9)
Immature Grans (Abs): 0 10*3/uL (ref 0.0–0.1)
Immature Granulocytes: 0 %
Lymphocytes Absolute: 3.5 10*3/uL — ABNORMAL HIGH (ref 0.7–3.1)
Lymphs: 36 %
MCH: 30 pg (ref 26.6–33.0)
MCHC: 33.7 g/dL (ref 31.5–35.7)
MCV: 89 fL (ref 79–97)
Monocytes Absolute: 0.6 10*3/uL (ref 0.1–0.9)
Monocytes: 6 %
Neutrophils Absolute: 5.3 10*3/uL (ref 1.4–7.0)
Neutrophils: 56 %
Platelets: 405 10*3/uL (ref 150–450)
RBC: 4.74 x10E6/uL (ref 3.77–5.28)
RDW: 14 % (ref 11.7–15.4)
WBC: 9.6 10*3/uL (ref 3.4–10.8)

## 2019-04-03 LAB — CMP14+EGFR
ALT: 41 IU/L — ABNORMAL HIGH (ref 0–32)
AST: 27 IU/L (ref 0–40)
Albumin/Globulin Ratio: 1.9 (ref 1.2–2.2)
Albumin: 4.3 g/dL (ref 3.8–4.8)
Alkaline Phosphatase: 76 IU/L (ref 39–117)
BUN/Creatinine Ratio: 13 (ref 12–28)
BUN: 11 mg/dL (ref 8–27)
Bilirubin Total: 0.4 mg/dL (ref 0.0–1.2)
CO2: 27 mmol/L (ref 20–29)
Calcium: 9.9 mg/dL (ref 8.7–10.3)
Chloride: 100 mmol/L (ref 96–106)
Creatinine, Ser: 0.87 mg/dL (ref 0.57–1.00)
GFR calc Af Amer: 79 mL/min/{1.73_m2} (ref >59)
GFR calc non Af Amer: 69 mL/min/{1.73_m2} (ref >59)
Globulin, Total: 2.3 g/dL (ref 1.5–4.5)
Glucose: 135 mg/dL — ABNORMAL HIGH (ref 65–99)
Potassium: 3.8 mmol/L (ref 3.5–5.2)
Sodium: 142 mmol/L (ref 134–144)
Total Protein: 6.6 g/dL (ref 6.0–8.5)

## 2019-04-03 LAB — ABO/RH: ABO/RH(D): A POS

## 2019-04-03 LAB — SURGICAL PCR SCREEN
MRSA, PCR: NEGATIVE
Staphylococcus aureus: NEGATIVE

## 2019-04-03 NOTE — Telephone Encounter (Signed)
Patient is in [place] and hospital cn see labs and EKG

## 2019-04-05 ENCOUNTER — Other Ambulatory Visit (HOSPITAL_COMMUNITY)
Admission: RE | Admit: 2019-04-05 | Discharge: 2019-04-05 | Disposition: A | Discharge status: Home / Self Care | Payer: Medicare HMO | Source: Ambulatory Visit | Attending: Orthopedic Surgery | Admitting: Orthopedic Surgery

## 2019-04-05 DIAGNOSIS — Z01812 Encounter for preprocedural laboratory examination: Secondary | ICD-10-CM | POA: Insufficient documentation

## 2019-04-05 DIAGNOSIS — Z20822 Contact with and (suspected) exposure to covid-19: Secondary | ICD-10-CM | POA: Diagnosis not present

## 2019-04-05 LAB — SARS CORONAVIRUS 2 (TAT 6-24 HRS): SARS Coronavirus 2: NEGATIVE

## 2019-04-09 ENCOUNTER — Ambulatory Visit (HOSPITAL_COMMUNITY): Payer: Medicare HMO | Admitting: Anesthesiology

## 2019-04-09 ENCOUNTER — Observation Stay (HOSPITAL_COMMUNITY): Payer: Medicare HMO

## 2019-04-09 ENCOUNTER — Other Ambulatory Visit: Payer: Self-pay

## 2019-04-09 ENCOUNTER — Encounter (HOSPITAL_COMMUNITY)
Admission: RE | Disposition: A | Discharge status: Home / Self Care | Payer: Self-pay | Source: Home / Self Care | Attending: Orthopedic Surgery

## 2019-04-09 ENCOUNTER — Observation Stay (HOSPITAL_COMMUNITY)
Admission: RE | Admit: 2019-04-09 | Discharge: 2019-04-10 | Disposition: A | Discharge status: Home / Self Care | Payer: Medicare HMO | Attending: Orthopedic Surgery | Admitting: Orthopedic Surgery

## 2019-04-09 ENCOUNTER — Ambulatory Visit (HOSPITAL_COMMUNITY): Payer: Medicare HMO

## 2019-04-09 ENCOUNTER — Encounter (HOSPITAL_COMMUNITY): Payer: Self-pay | Admitting: Orthopedic Surgery

## 2019-04-09 DIAGNOSIS — Z96641 Presence of right artificial hip joint: Secondary | ICD-10-CM

## 2019-04-09 DIAGNOSIS — E785 Hyperlipidemia, unspecified: Secondary | ICD-10-CM | POA: Insufficient documentation

## 2019-04-09 DIAGNOSIS — E663 Overweight: Secondary | ICD-10-CM | POA: Diagnosis present

## 2019-04-09 DIAGNOSIS — M797 Fibromyalgia: Secondary | ICD-10-CM | POA: Diagnosis not present

## 2019-04-09 DIAGNOSIS — Z419 Encounter for procedure for purposes other than remedying health state, unspecified: Secondary | ICD-10-CM

## 2019-04-09 DIAGNOSIS — Z79899 Other long term (current) drug therapy: Secondary | ICD-10-CM | POA: Insufficient documentation

## 2019-04-09 DIAGNOSIS — Z6833 Body mass index (BMI) 33.0-33.9, adult: Secondary | ICD-10-CM | POA: Diagnosis not present

## 2019-04-09 DIAGNOSIS — Z96649 Presence of unspecified artificial hip joint: Secondary | ICD-10-CM

## 2019-04-09 DIAGNOSIS — E669 Obesity, unspecified: Secondary | ICD-10-CM | POA: Insufficient documentation

## 2019-04-09 DIAGNOSIS — M1611 Unilateral primary osteoarthritis, right hip: Principal | ICD-10-CM | POA: Insufficient documentation

## 2019-04-09 DIAGNOSIS — E8881 Metabolic syndrome: Secondary | ICD-10-CM | POA: Insufficient documentation

## 2019-04-09 DIAGNOSIS — I10 Essential (primary) hypertension: Secondary | ICD-10-CM | POA: Insufficient documentation

## 2019-04-09 DIAGNOSIS — Z7982 Long term (current) use of aspirin: Secondary | ICD-10-CM | POA: Diagnosis not present

## 2019-04-09 DIAGNOSIS — M25751 Osteophyte, right hip: Secondary | ICD-10-CM | POA: Diagnosis not present

## 2019-04-09 DIAGNOSIS — Z471 Aftercare following joint replacement surgery: Secondary | ICD-10-CM | POA: Diagnosis not present

## 2019-04-09 HISTORY — PX: TOTAL HIP ARTHROPLASTY: SHX124

## 2019-04-09 LAB — CBC
HCT: 40.5 % (ref 36.0–46.0)
Hemoglobin: 13.4 g/dL (ref 12.0–15.0)
MCH: 29.5 pg (ref 26.0–34.0)
MCHC: 33.1 g/dL (ref 30.0–36.0)
MCV: 89 fL (ref 80.0–100.0)
Platelets: 375 10*3/uL (ref 150–400)
RBC: 4.55 MIL/uL (ref 3.87–5.11)
RDW: 13.9 % (ref 11.5–15.5)
WBC: 8.2 10*3/uL (ref 4.0–10.5)
nRBC: 0 % (ref 0.0–0.2)

## 2019-04-09 LAB — BASIC METABOLIC PANEL
Anion gap: 12 (ref 5–15)
BUN: 12 mg/dL (ref 8–23)
CO2: 27 mmol/L (ref 22–32)
Calcium: 9.3 mg/dL (ref 8.9–10.3)
Chloride: 101 mmol/L (ref 98–111)
Creatinine, Ser: 0.76 mg/dL (ref 0.44–1.00)
GFR calc Af Amer: >60 mL/min (ref >60)
GFR calc non Af Amer: >60 mL/min (ref >60)
Glucose, Bld: 162 mg/dL — ABNORMAL HIGH (ref 70–99)
Potassium: 3.3 mmol/L — ABNORMAL LOW (ref 3.5–5.1)
Sodium: 140 mmol/L (ref 135–145)

## 2019-04-09 LAB — TYPE AND SCREEN
ABO/RH(D): A POS
Antibody Screen: NEGATIVE

## 2019-04-09 SURGERY — ARTHROPLASTY, HIP, TOTAL, ANTERIOR APPROACH
Anesthesia: Spinal | Site: Hip | Laterality: Right

## 2019-04-09 MED ORDER — GABAPENTIN 100 MG PO CAPS
100.0000 mg | ORAL_CAPSULE | Freq: Every day | ORAL | Status: DC
  Administered 2019-04-09: 100 mg via ORAL
  Filled 2019-04-09: qty 1

## 2019-04-09 MED ORDER — DIPHENHYDRAMINE HCL 12.5 MG/5ML PO ELIX: 12.5000 mg | ORAL_SOLUTION | ORAL | Status: DC | PRN

## 2019-04-09 MED ORDER — FENTANYL CITRATE (PF) 250 MCG/5ML IJ SOLN
INTRAMUSCULAR | Status: AC
  Filled 2019-04-09: qty 5

## 2019-04-09 MED ORDER — BISACODYL 10 MG RE SUPP: 10.0000 mg | Freq: Every day | RECTAL | Status: DC | PRN

## 2019-04-09 MED ORDER — TRANEXAMIC ACID-NACL 1000-0.7 MG/100ML-% IV SOLN
1000.0000 mg | INTRAVENOUS | Status: AC
  Administered 2019-04-09: 1000 mg via INTRAVENOUS
  Filled 2019-04-09: qty 100

## 2019-04-09 MED ORDER — PHENYLEPHRINE HCL-NACL 10-0.9 MG/250ML-% IV SOLN
INTRAVENOUS | Status: DC | PRN
  Administered 2019-04-09: 40 ug/min via INTRAVENOUS

## 2019-04-09 MED ORDER — METOCLOPRAMIDE HCL 5 MG PO TABS: 5.0000 mg | ORAL_TABLET | Freq: Three times a day (TID) | ORAL | Status: DC | PRN

## 2019-04-09 MED ORDER — POLYETHYLENE GLYCOL 3350 17 G PO PACK
17.0000 g | PACK | Freq: Two times a day (BID) | ORAL | Status: DC
  Filled 2019-04-09: qty 1

## 2019-04-09 MED ORDER — HYDROCODONE-ACETAMINOPHEN 5-325 MG PO TABS
1.0000 | ORAL_TABLET | ORAL | Status: DC | PRN
  Administered 2019-04-09: 1 via ORAL
  Administered 2019-04-09 – 2019-04-10 (×2): 2 via ORAL
  Filled 2019-04-09: qty 1
  Filled 2019-04-09 (×2): qty 2

## 2019-04-09 MED ORDER — METOCLOPRAMIDE HCL 5 MG/ML IJ SOLN: 5.0000 mg | Freq: Three times a day (TID) | INTRAMUSCULAR | Status: DC | PRN

## 2019-04-09 MED ORDER — ONDANSETRON HCL 4 MG/2ML IJ SOLN
INTRAMUSCULAR | Status: DC | PRN
  Administered 2019-04-09: 4 mg via INTRAVENOUS

## 2019-04-09 MED ORDER — TRANEXAMIC ACID-NACL 1000-0.7 MG/100ML-% IV SOLN: 1000.0000 mg | INTRAVENOUS | Status: DC

## 2019-04-09 MED ORDER — HYDROMORPHONE HCL 1 MG/ML IJ SOLN
0.5000 mg | INTRAMUSCULAR | Status: DC | PRN
  Administered 2019-04-09: 19:00:00 1 mg via INTRAVENOUS
  Filled 2019-04-09 (×2): qty 1

## 2019-04-09 MED ORDER — FLUOROMETHOLONE 0.1 % OP SUSP
1.0000 [drp] | Freq: Every day | OPHTHALMIC | Status: DC
  Filled 2019-04-09: qty 5

## 2019-04-09 MED ORDER — CEFAZOLIN SODIUM-DEXTROSE 2-4 GM/100ML-% IV SOLN
2.0000 g | INTRAVENOUS | Status: AC
  Administered 2019-04-09: 2 g via INTRAVENOUS
  Filled 2019-04-09: qty 100

## 2019-04-09 MED ORDER — HYDROCODONE-ACETAMINOPHEN 7.5-325 MG PO TABS: 1.0000 | ORAL_TABLET | ORAL | Status: DC | PRN

## 2019-04-09 MED ORDER — ASPIRIN 81 MG PO CHEW
81.0000 mg | CHEWABLE_TABLET | Freq: Two times a day (BID) | ORAL | Status: DC
  Administered 2019-04-09 – 2019-04-10 (×2): 81 mg via ORAL
  Filled 2019-04-09 (×2): qty 1

## 2019-04-09 MED ORDER — CHLORHEXIDINE GLUCONATE 4 % EX LIQD: 60.0000 mL | Freq: Once | CUTANEOUS | Status: DC

## 2019-04-09 MED ORDER — PROPOFOL 10 MG/ML IV BOLUS
INTRAVENOUS | Status: DC | PRN
  Administered 2019-04-09 (×3): 20 mg via INTRAVENOUS

## 2019-04-09 MED ORDER — FENTANYL CITRATE (PF) 250 MCG/5ML IJ SOLN
INTRAMUSCULAR | Status: DC | PRN
  Administered 2019-04-09 (×3): 25 ug via INTRAVENOUS
  Administered 2019-04-09 (×2): 50 ug via INTRAVENOUS
  Administered 2019-04-09: 25 ug via INTRAVENOUS
  Administered 2019-04-09: 50 ug via INTRAVENOUS

## 2019-04-09 MED ORDER — METOPROLOL SUCCINATE ER 50 MG PO TB24
50.0000 mg | ORAL_TABLET | Freq: Every day | ORAL | Status: DC
  Administered 2019-04-10: 09:00:00 50 mg via ORAL
  Filled 2019-04-09: qty 1

## 2019-04-09 MED ORDER — HYDROMORPHONE HCL 1 MG/ML IJ SOLN
0.2500 mg | INTRAMUSCULAR | Status: DC | PRN
  Administered 2019-04-09: 0.5 mg via INTRAVENOUS
  Administered 2019-04-09: 14:00:00 0.25 mg via INTRAVENOUS
  Administered 2019-04-09 (×2): 0.5 mg via INTRAVENOUS

## 2019-04-09 MED ORDER — METHOCARBAMOL 500 MG PO TABS
500.0000 mg | ORAL_TABLET | Freq: Four times a day (QID) | ORAL | Status: DC | PRN
  Administered 2019-04-09 – 2019-04-10 (×2): 500 mg via ORAL
  Filled 2019-04-09 (×2): qty 1

## 2019-04-09 MED ORDER — CEFAZOLIN SODIUM-DEXTROSE 2-4 GM/100ML-% IV SOLN: 2.0000 g | INTRAVENOUS | Status: DC

## 2019-04-09 MED ORDER — CEFAZOLIN SODIUM-DEXTROSE 2-4 GM/100ML-% IV SOLN
2.0000 g | Freq: Four times a day (QID) | INTRAVENOUS | Status: AC
  Administered 2019-04-09 (×2): 2 g via INTRAVENOUS
  Filled 2019-04-09 (×2): qty 100

## 2019-04-09 MED ORDER — DOCUSATE SODIUM 100 MG PO CAPS
100.0000 mg | ORAL_CAPSULE | Freq: Two times a day (BID) | ORAL | Status: DC
  Administered 2019-04-09 – 2019-04-10 (×2): 100 mg via ORAL
  Filled 2019-04-09 (×2): qty 1

## 2019-04-09 MED ORDER — 0.9 % SODIUM CHLORIDE (POUR BTL) OPTIME
TOPICAL | Status: DC | PRN
  Administered 2019-04-09: 1000 mL

## 2019-04-09 MED ORDER — PROMETHAZINE HCL 25 MG/ML IJ SOLN: 6.2500 mg | INTRAMUSCULAR | Status: DC | PRN

## 2019-04-09 MED ORDER — OMEPRAZOLE 20 MG PO CPDR
20.0000 mg | DELAYED_RELEASE_CAPSULE | Freq: Every day | ORAL | Status: DC
  Administered 2019-04-10: 09:00:00 20 mg via ORAL
  Filled 2019-04-09: qty 1

## 2019-04-09 MED ORDER — LACTATED RINGERS IV SOLN: INTRAVENOUS | Status: DC

## 2019-04-09 MED ORDER — ONDANSETRON HCL 4 MG PO TABS: 4.0000 mg | ORAL_TABLET | Freq: Four times a day (QID) | ORAL | Status: DC | PRN

## 2019-04-09 MED ORDER — ONDANSETRON HCL 4 MG/2ML IJ SOLN: 4.0000 mg | Freq: Four times a day (QID) | INTRAMUSCULAR | Status: DC | PRN

## 2019-04-09 MED ORDER — MIDAZOLAM HCL 2 MG/2ML IJ SOLN
INTRAMUSCULAR | Status: AC
  Filled 2019-04-09: qty 2

## 2019-04-09 MED ORDER — HYDROMORPHONE HCL 1 MG/ML IJ SOLN
INTRAMUSCULAR | Status: AC
  Administered 2019-04-09: 13:00:00 0.25 mg via INTRAVENOUS
  Filled 2019-04-09: qty 2

## 2019-04-09 MED ORDER — PHENYLEPHRINE 40 MCG/ML (10ML) SYRINGE FOR IV PUSH (FOR BLOOD PRESSURE SUPPORT)
PREFILLED_SYRINGE | INTRAVENOUS | Status: AC
  Filled 2019-04-09: qty 10

## 2019-04-09 MED ORDER — MIDAZOLAM HCL 2 MG/2ML IJ SOLN
INTRAMUSCULAR | Status: DC | PRN
  Administered 2019-04-09: 1 mg via INTRAVENOUS
  Administered 2019-04-09 (×2): .5 mg via INTRAVENOUS

## 2019-04-09 MED ORDER — ALUM & MAG HYDROXIDE-SIMETH 200-200-20 MG/5ML PO SUSP: 15.0000 mL | ORAL | Status: DC | PRN

## 2019-04-09 MED ORDER — DEXAMETHASONE SODIUM PHOSPHATE 10 MG/ML IJ SOLN: 10.0000 mg | Freq: Once | INTRAMUSCULAR | Status: DC

## 2019-04-09 MED ORDER — CELECOXIB 200 MG PO CAPS
200.0000 mg | ORAL_CAPSULE | Freq: Two times a day (BID) | ORAL | Status: DC
  Administered 2019-04-09 – 2019-04-10 (×2): 200 mg via ORAL
  Filled 2019-04-09 (×2): qty 1

## 2019-04-09 MED ORDER — DEXAMETHASONE SODIUM PHOSPHATE 10 MG/ML IJ SOLN
INTRAMUSCULAR | Status: AC
  Filled 2019-04-09: qty 1

## 2019-04-09 MED ORDER — SODIUM CHLORIDE 0.9 % IV SOLN: INTRAVENOUS | Status: DC

## 2019-04-09 MED ORDER — PROPOFOL 500 MG/50ML IV EMUL
INTRAVENOUS | Status: DC | PRN
  Administered 2019-04-09: 60 ug/kg/min via INTRAVENOUS

## 2019-04-09 MED ORDER — PHENOL 1.4 % MT LIQD: 1.0000 | OROMUCOSAL | Status: DC | PRN

## 2019-04-09 MED ORDER — FERROUS SULFATE 325 (65 FE) MG PO TABS
325.0000 mg | ORAL_TABLET | Freq: Three times a day (TID) | ORAL | Status: DC
  Administered 2019-04-10: 325 mg via ORAL
  Filled 2019-04-09: qty 1

## 2019-04-09 MED ORDER — METHOCARBAMOL 500 MG IVPB - SIMPLE MED
500.0000 mg | Freq: Four times a day (QID) | INTRAVENOUS | Status: DC | PRN
  Filled 2019-04-09: qty 50

## 2019-04-09 MED ORDER — MENTHOL 3 MG MT LOZG: 1.0000 | LOZENGE | OROMUCOSAL | Status: DC | PRN

## 2019-04-09 MED ORDER — MAGNESIUM CITRATE PO SOLN: 1.0000 | Freq: Once | ORAL | Status: DC | PRN

## 2019-04-09 MED ORDER — METHOCARBAMOL 500 MG IVPB - SIMPLE MED
INTRAVENOUS | Status: AC
  Administered 2019-04-09: 13:00:00 500 mg via INTRAVENOUS
  Filled 2019-04-09: qty 50

## 2019-04-09 MED ORDER — TRANEXAMIC ACID-NACL 1000-0.7 MG/100ML-% IV SOLN
1000.0000 mg | Freq: Once | INTRAVENOUS | Status: AC
  Administered 2019-04-09: 1000 mg via INTRAVENOUS
  Filled 2019-04-09: qty 100

## 2019-04-09 MED ORDER — LIDOCAINE 2% (20 MG/ML) 5 ML SYRINGE
INTRAMUSCULAR | Status: DC | PRN
  Administered 2019-04-09 (×2): 20 mg via INTRAVENOUS

## 2019-04-09 MED ORDER — ATORVASTATIN CALCIUM 40 MG PO TABS
40.0000 mg | ORAL_TABLET | Freq: Every day | ORAL | Status: DC
  Administered 2019-04-09: 40 mg via ORAL
  Filled 2019-04-09: qty 1

## 2019-04-09 MED ORDER — BUPIVACAINE IN DEXTROSE 0.75-8.25 % IT SOLN
INTRATHECAL | Status: DC | PRN
  Administered 2019-04-09: 1.6 mL via INTRATHECAL

## 2019-04-09 MED ORDER — ACETAMINOPHEN 325 MG PO TABS: 325.0000 mg | ORAL_TABLET | Freq: Four times a day (QID) | ORAL | Status: DC | PRN

## 2019-04-09 MED ORDER — HYDROCHLOROTHIAZIDE 25 MG PO TABS
25.0000 mg | ORAL_TABLET | Freq: Every day | ORAL | Status: DC
  Administered 2019-04-10: 25 mg via ORAL
  Filled 2019-04-09: qty 1

## 2019-04-09 MED ORDER — NON FORMULARY: 20.0000 mg | Freq: Every day | Status: DC

## 2019-04-09 MED ORDER — DEXAMETHASONE SODIUM PHOSPHATE 10 MG/ML IJ SOLN
10.0000 mg | Freq: Once | INTRAMUSCULAR | Status: AC
  Administered 2019-04-09: 11:00:00 10 mg via INTRAVENOUS

## 2019-04-09 MED ORDER — DEXAMETHASONE SODIUM PHOSPHATE 10 MG/ML IJ SOLN
10.0000 mg | Freq: Once | INTRAMUSCULAR | Status: AC
  Administered 2019-04-10: 10 mg via INTRAVENOUS
  Filled 2019-04-09: qty 1

## 2019-04-09 SURGICAL SUPPLY — 47 items
ADH SKN CLS APL DERMABOND .7 (GAUZE/BANDAGES/DRESSINGS) ×1
BAG DECANTER FOR FLEXI CONT (MISCELLANEOUS) IMPLANT
BAG SPEC THK2 15X12 ZIP CLS (MISCELLANEOUS) ×1
BAG ZIPLOCK 12X15 (MISCELLANEOUS) ×1 IMPLANT
BLADE SAG 18X100X1.27 (BLADE) ×2 IMPLANT
BLADE SURG SZ10 CARB STEEL (BLADE) ×4 IMPLANT
COVER PERINEAL POST (MISCELLANEOUS) ×2 IMPLANT
COVER SURGICAL LIGHT HANDLE (MISCELLANEOUS) ×2 IMPLANT
COVER WAND RF STERILE (DRAPES) IMPLANT
CUP ACETBLR 52 OD PINNACLE (Hips) ×1 IMPLANT
DERMABOND ADVANCED (GAUZE/BANDAGES/DRESSINGS) ×1
DERMABOND ADVANCED .7 DNX12 (GAUZE/BANDAGES/DRESSINGS) ×1 IMPLANT
DRAPE STERI IOBAN 125X83 (DRAPES) ×2 IMPLANT
DRAPE U-SHAPE 47X51 STRL (DRAPES) ×4 IMPLANT
DRESSING AQUACEL AG SP 3.5X10 (GAUZE/BANDAGES/DRESSINGS) ×1 IMPLANT
DRSG AQUACEL AG SP 3.5X10 (GAUZE/BANDAGES/DRESSINGS) ×2
DURAPREP 26ML APPLICATOR (WOUND CARE) ×2 IMPLANT
ELECT REM PT RETURN 15FT ADLT (MISCELLANEOUS) ×2 IMPLANT
ELIMINATOR HOLE APEX DEPUY (Hips) ×1 IMPLANT
GLOVE BIO SURGEON STRL SZ 6 (GLOVE) ×2 IMPLANT
GLOVE BIOGEL PI IND STRL 6.5 (GLOVE) ×1 IMPLANT
GLOVE BIOGEL PI IND STRL 7.5 (GLOVE) ×1 IMPLANT
GLOVE BIOGEL PI IND STRL 8.5 (GLOVE) ×1 IMPLANT
GLOVE BIOGEL PI INDICATOR 6.5 (GLOVE)
GLOVE BIOGEL PI INDICATOR 7.5 (GLOVE) ×1
GLOVE BIOGEL PI INDICATOR 8.5 (GLOVE) ×1
GLOVE ECLIPSE 8.0 STRL XLNG CF (GLOVE) ×4 IMPLANT
GLOVE ORTHO TXT STRL SZ7.5 (GLOVE) ×4 IMPLANT
GOWN STRL REUS W/TWL LRG LVL3 (GOWN DISPOSABLE) ×4 IMPLANT
GOWN STRL REUS W/TWL XL LVL3 (GOWN DISPOSABLE) ×2 IMPLANT
HEAD CERAMIC DELTA 36 PLUS 1.5 (Hips) ×1 IMPLANT
HOLDER FOLEY CATH W/STRAP (MISCELLANEOUS) ×2 IMPLANT
KIT TURNOVER KIT A (KITS) ×1 IMPLANT
LINER NEUTRAL 52X36MM PLUS 4 (Liner) ×1 IMPLANT
PACK ANTERIOR HIP CUSTOM (KITS) ×2 IMPLANT
PENCIL SMOKE EVACUATOR (MISCELLANEOUS) ×1 IMPLANT
SCREW 6.5MMX30MM (Screw) ×1 IMPLANT
STEM FEM SZ3 STD ACTIS (Stem) ×1 IMPLANT
SUT MNCRL AB 4-0 PS2 18 (SUTURE) ×2 IMPLANT
SUT STRATAFIX 0 PDS 27 VIOLET (SUTURE) ×2
SUT VIC AB 1 CT1 36 (SUTURE) ×6 IMPLANT
SUT VIC AB 2-0 CT1 27 (SUTURE) ×4
SUT VIC AB 2-0 CT1 TAPERPNT 27 (SUTURE) ×2 IMPLANT
SUTURE STRATFX 0 PDS 27 VIOLET (SUTURE) ×1 IMPLANT
TRAY FOLEY MTR SLVR 16FR STAT (SET/KITS/TRAYS/PACK) ×1 IMPLANT
WATER STERILE IRR 1000ML POUR (IV SOLUTION) ×2 IMPLANT
YANKAUER SUCT BULB TIP 10FT TU (MISCELLANEOUS) IMPLANT

## 2019-04-09 NOTE — Interval H&P Note (Signed)
History and Physical Interval Note:  04/09/2019 10:04 AM  [name]  has presented today for surgery, with the diagnosis of Right hip osteoarthritis.  The various methods of treatment have been discussed with the patient and family. After consideration of risks, benefits and other options for treatment, the patient has consented to  Procedure(s) with comments: TOTAL HIP ARTHROPLASTY ANTERIOR APPROACH (Right) - 70 mins as a surgical intervention.  The patient's history has been reviewed, patient examined, no change in status, stable for surgery.  I have reviewed the patient's chart and labs.  Questions were answered to the patient's satisfaction.     Mauri Pole

## 2019-04-09 NOTE — Anesthesia Procedure Notes (Signed)
Spinal  Patient location during procedure: OR End time: 04/09/2019 11:08 AM Staffing Performed: resident/CRNA  Resident/CRNA: Cynda Familia, CRNA Preanesthetic Checklist Completed: patient identified, IV checked, site marked, risks and benefits discussed, surgical consent, monitors and equipment checked, pre-op evaluation and timeout performed Spinal Block Patient position: sitting Prep: ChloraPrep and site prepped and draped Patient monitoring: heart rate, continuous pulse ox, blood pressure and cardiac monitor Approach: midline Location: L3-4 Injection technique: single-shot Needle Needle type: Sprotte  Needle gauge: 24 G Additional Notes Expiration date of tray noted and within date.   Patient tolerated procedure well. Rose supervised-- prep dry at insertion pt tolerated well

## 2019-04-09 NOTE — Op Note (Signed)
NAME:  [name] NO.: [id number]      MEDICAL RECORD NO.: [id number]      FACILITY:  [facility]      PHYSICIAN:  Mauri Pole  DATE OF BIRTH:  1950-05-24     DATE OF PROCEDURE:  04/09/2019                                 OPERATIVE REPORT         PREOPERATIVE DIAGNOSIS: Right  hip osteoarthritis.      POSTOPERATIVE DIAGNOSIS:  Right hip osteoarthritis.      PROCEDURE:  Right total hip replacement through an anterior approach   utilizing DePuy THR system, component size 63mm pinnacle cup, a size 36+4 neutral   Altrex liner, a size 3 standard Actis stem with a 36+1.5 delta ceramic   ball.      SURGEON:  Pietro Cassis. Alvan Dame, M.D.      ASSISTANT:  Danae Orleans, PA-C     ANESTHESIA:  Spinal.      SPECIMENS:  None.      COMPLICATIONS:  None.      BLOOD LOSS:  450 cc     DRAINS:  None.      INDICATION OF THE PROCEDURE:  [name] is a 69 y.o. female who had   presented to office for evaluation of right hip pain.  Radiographs revealed   progressive degenerative changes with bone-on-bone   articulation of the  hip joint, including subchondral cystic changes and osteophytes.  The patient had painful limited range of   motion significantly affecting their overall quality of life and function.  The patient was failing to    respond to conservative measures including medications and/or injections and activity modification and at this point was ready   to proceed with more definitive measures.  Consent was obtained for   benefit of pain relief.  Specific risks of infection, DVT, component   failure, dislocation, neurovascular injury, and need for revision surgery were reviewed in the office as well discussion of   the anterior versus posterior approach were reviewed.     PROCEDURE IN DETAIL:  The patient was brought to operative theater.   Once adequate anesthesia, preoperative antibiotics, 2 gm of Ancef, 1 gm of Tranexamic Acid,  and 10 mg of Decadron were administered, the patient was positioned supine on the [employer] table.  Once the patient was safely positioned with adequate padding of boney prominences we predraped out the hip, and used fluoroscopy to confirm orientation of the pelvis.      The right hip was then prepped and draped from proximal iliac crest to   mid thigh with a shower curtain technique.      Time-out was performed identifying the patient, planned procedure, and the appropriate extremity.     An incision was then made 2 cm lateral to the   anterior superior iliac spine extending over the orientation of the   tensor fascia lata muscle and sharp dissection was carried down to the   fascia of the muscle.      The fascia was then incised.  The muscle belly was identified and swept   laterally and retractor placed along the superior neck.  Following   cauterization of the circumflex vessels and removing some pericapsular  fat, a second cobra retractor was placed on the inferior neck.  A T-capsulotomy was made along the line of the   superior neck to the trochanteric fossa, then extended proximally and   distally.  Tag sutures were placed and the retractors were then placed   intracapsular.  We then identified the trochanteric fossa and   orientation of my neck cut and then made a neck osteotomy with the femur on traction.  The femoral   head was removed without difficulty or complication.  Traction was let   off and retractors were placed posterior and anterior around the   acetabulum.      The labrum and foveal tissue were debrided.  I began reaming with a 45 mm   reamer and reamed up to 51 mm reamer with good bony bed preparation and a 52 mm  cup was chosen.  The final 52 mm Pinnacle cup was then impacted under fluoroscopy to confirm the depth of penetration and orientation with respect to   Abduction and forward flexion.  A screw was placed into the ilium followed by the hole eliminator.  The  final   36+4 neutral Altrex liner was impacted with good visualized rim fit.  The cup was positioned anatomically within the acetabular portion of the pelvis.      At this point, the femur was rolled to 100 degrees.  Further capsule was   released off the inferior aspect of the femoral neck.  I then   released the superior capsule proximally.  With the leg in a neutral position the hook was placed laterally   along the femur under the vastus lateralis origin and elevated manually and then held in position using the hook attachment on the bed.  The leg was then extended and adducted with the leg rolled to 100   degrees of external rotation.  Retractors were placed along the medial calcar and posteriorly over the greater trochanter.  Once the proximal femur was fully   exposed, I used a box osteotome to set orientation.  I then began   broaching with the starting chili pepper broach and passed this by hand and then broached up to 3.  With the 3 broach in place I chose a standard offset neck and did several trial reductions.  The offset was appropriate, leg lengths   appeared to be equal best matched with the +1.5 head ball trial confirmed radiographically.   Given these findings, I went ahead and dislocated the hip, repositioned all   retractors and positioned the right hip in the extended and abducted position.  The final 3 standard Actis stem was   chosen and it was impacted down to the level of neck cut.  Based on this   and the trial reductions, a final 36+1.5 delta ceramic ball was chosen and   impacted onto a clean and dry trunnion, and the hip was reduced.  The   hip had been irrigated throughout the case again at this point.  I did   reapproximate the superior capsular leaflet to the anterior leaflet   using #1 Vicryl.  The fascia of the   tensor fascia lata muscle was then reapproximated using #1 Vicryl and #0 Stratafix sutures.  The   remaining wound was closed with 2-0 Vicryl and  running 4-0 Monocryl.   The hip was cleaned, dried, and dressed sterilely using Dermabond and   Aquacel dressing.  The patient was then brought   to recovery room in  stable condition tolerating the procedure well.    Danae Orleans, PA-C was present for the entirety of the case involved from   preoperative positioning, perioperative retractor management, general   facilitation of the case, as well as primary wound closure as assistant.            Pietro Cassis Alvan Dame, M.D.        04/09/2019 11:21 AM

## 2019-04-09 NOTE — Discharge Instructions (Signed)
INSTRUCTIONS AFTER JOINT REPLACEMENT   o Remove items at home which could result in a fall. This includes throw rugs or furniture in walking pathways o ICE to the affected joint every three hours while awake for 30 minutes at a time, for at least the first 3-5 days, and then as needed for pain and swelling.  Continue to use ice for pain and swelling. You may notice swelling that will progress down to the foot and ankle.  This is normal after surgery.  Elevate your leg when you are not up walking on it.   o Continue to use the breathing machine you got in the hospital (incentive spirometer) which will help keep your temperature down.  It is common for your temperature to cycle up and down following surgery, especially at night when you are not up moving around and exerting yourself.  The breathing machine keeps your lungs expanded and your temperature down.   DIET:  As you were doing prior to hospitalization, we recommend a well-balanced diet.  DRESSING / WOUND CARE / SHOWERING  Keep the surgical dressing until follow up.  The dressing is water proof, so you can shower without any extra covering.  IF THE DRESSING FALLS OFF or the wound gets wet inside, change the dressing with sterile gauze.  Please use good hand washing techniques before changing the dressing.  Do not use any lotions or creams on the incision until instructed by your surgeon.    ACTIVITY  o Increase activity slowly as tolerated, but follow the weight bearing instructions below.   o No driving for 6 weeks or until further direction given by your physician.  You cannot drive while taking narcotics.  o No lifting or carrying greater than 10 lbs. until further directed by your surgeon. o Avoid periods of inactivity such as sitting longer than an hour when not asleep. This helps prevent blood clots.  o You may return to work once you are authorized by your doctor.     WEIGHT BEARING   Weight bearing as tolerated with assist  device (Hefley, cane, etc) as directed, use it as long as suggested by your surgeon or therapist, typically at least 4-6 weeks.   EXERCISES  Results after joint replacement surgery are often greatly improved when you follow the exercise, range of motion and muscle strengthening exercises prescribed by your doctor. Safety measures are also important to protect the joint from further injury. Any time any of these exercises cause you to have increased pain or swelling, decrease what you are doing until you are comfortable again and then slowly increase them. If you have problems or questions, call your caregiver or physical therapist for advice.   Rehabilitation is important following a joint replacement. After just a few days of immobilization, the muscles of the leg can become weakened and shrink (atrophy).  These exercises are designed to build up the tone and strength of the thigh and leg muscles and to improve motion. Often times heat used for twenty to thirty minutes before working out will loosen up your tissues and help with improving the range of motion but do not use heat for the first two weeks following surgery (sometimes heat can increase post-operative swelling).   These exercises can be done on a training (exercise) mat, on the floor, on a table or on a bed. Use whatever works the best and is most comfortable for you.    Use music or television while you are exercising so that   the exercises are a pleasant break in your day. This will make your life better with the exercises acting as a break in your routine that you can look forward to.   Perform all exercises about fifteen times, three times per day or as directed.  You should exercise both the operative leg and the other leg as well.  Exercises include:   . Quad Sets - Tighten up the muscle on the front of the thigh (Quad) and hold for 5-10 seconds.   . Straight Leg Raises - With your knee straight (if you were given a brace, keep it on),  lift the leg to 60 degrees, hold for 3 seconds, and slowly lower the leg.  Perform this exercise against resistance later as your leg gets stronger.  . Leg Slides: Lying on your back, slowly slide your foot toward your buttocks, bending your knee up off the floor (only go as far as is comfortable). Then slowly slide your foot back down until your leg is flat on the floor again.  . Angel Wings: Lying on your back spread your legs to the side as far apart as you can without causing discomfort.  . Hamstring Strength:  Lying on your back, push your heel against the floor with your leg straight by tightening up the muscles of your buttocks.  Repeat, but this time bend your knee to a comfortable angle, and push your heel against the floor.  You may put a pillow under the heel to make it more comfortable if necessary.   A rehabilitation program following joint replacement surgery can speed recovery and prevent re-injury in the future due to weakened muscles. Contact your doctor or a physical therapist for more information on knee rehabilitation.    CONSTIPATION  Constipation is defined medically as fewer than three stools per week and severe constipation as less than one stool per week.  Even if you have a regular bowel pattern at home, your normal regimen is likely to be disrupted due to multiple reasons following surgery.  Combination of anesthesia, postoperative narcotics, change in appetite and fluid intake all can affect your bowels.   YOU MUST use at least one of the following options; they are listed in order of increasing strength to get the job done.  They are all available over the counter, and you may need to use some, POSSIBLY even all of these options:    Drink plenty of fluids (prune juice may be helpful) and high fiber foods Colace 100 mg by mouth twice a day  Senokot for constipation as directed and as needed Dulcolax (bisacodyl), take with full glass of water  Miralax (polyethylene glycol)  once or twice a day as needed.  If you have tried all these things and are unable to have a bowel movement in the first 3-4 days after surgery call either your surgeon or your primary doctor.    If you experience loose stools or diarrhea, hold the medications until you stool forms back up.  If your symptoms do not get better within 1 week or if they get worse, check with your doctor.  If you experience "the worst abdominal pain ever" or develop nausea or vomiting, please contact the office immediately for further recommendations for treatment.   ITCHING:  If you experience itching with your medications, try taking only a single pain pill, or even half a pain pill at a time.  You can also use Benadryl over the counter for itching or also to   help with sleep.   TED HOSE STOCKINGS:  Use stockings on both legs until for at least 2 weeks or as directed by physician office. They may be removed at night for sleeping.  MEDICATIONS:  See your medication summary on the "After Visit Summary" that nursing will review with you.  You may have some home medications which will be placed on hold until you complete the course of blood thinner medication.  It is important for you to complete the blood thinner medication as prescribed.  PRECAUTIONS:  If you experience chest pain or shortness of breath - call 911 immediately for transfer to the hospital emergency department.   If you develop a fever greater that 101 F, purulent drainage from wound, increased redness or drainage from wound, foul odor from the wound/dressing, or calf pain - CONTACT YOUR SURGEON.                                                   FOLLOW-UP APPOINTMENTS:  If you do not already have a post-op appointment, please call the office for an appointment to be seen by your surgeon.  Guidelines for how soon to be seen are listed in your "After Visit Summary", but are typically between 1-4 weeks after surgery.  OTHER INSTRUCTIONS:   Knee  Replacement:  Do not place pillow under knee, focus on keeping the knee straight while resting.    DENTAL ANTIBIOTICS:  In most cases prophylactic antibiotics for Dental procdeures after total joint surgery are not necessary.  Exceptions are as follows:  1. History of prior total joint infection  2. Severely immunocompromised (Organ Transplant, cancer chemotherapy, Rheumatoid biologic meds such as Humera)  3. Poorly controlled diabetes (A1C &gt; 8.0, blood glucose over 200)  If you have one of these conditions, contact your surgeon for an antibiotic prescription, prior to your dental procedure.   MAKE SURE YOU:  . Understand these instructions.  . Get help right away if you are not doing well or get worse.    Thank you for letting us be a part of your medical care team.  It is a privilege we respect greatly.  We hope these instructions will help you stay on track for a fast and full recovery!    

## 2019-04-09 NOTE — Transfer of Care (Signed)
Immediate Anesthesia Transfer of Care Note  Patient: [name]  Procedure(s) Performed: RIGHT TOTAL HIP ARTHROPLASTY ANTERIOR APPROACH (Right Hip)  Patient Location: PACU  Anesthesia Type:Spinal  Level of Consciousness: sedated  Airway & Oxygen Therapy: Patient Spontanous Breathing and Patient connected to face mask oxygen  Post-op Assessment: Report given to RN and Post -op Vital signs reviewed and stable  Post vital signs: Reviewed and stable  Last Vitals:  Vitals Value Taken Time  BP 104/53 04/09/19 1255  Temp    Pulse 78 04/09/19 1257  Resp 8 04/09/19 1257  SpO2 100 % 04/09/19 1257  Vitals shown include unvalidated device data.  Last Pain:  Vitals:   04/09/19 0924  TempSrc: Oral  PainSc: 0-No pain         Complications: No apparent anesthesia complications

## 2019-04-09 NOTE — Anesthesia Postprocedure Evaluation (Signed)
Anesthesia Post Note  Patient: [name]  Procedure(s) Performed: RIGHT TOTAL HIP ARTHROPLASTY ANTERIOR APPROACH (Right Hip)     Patient location during evaluation: PACU Anesthesia Type: Spinal Level of consciousness: oriented and awake and alert Pain management: pain level controlled Vital Signs Assessment: post-procedure vital signs reviewed and stable Respiratory status: spontaneous breathing, respiratory function stable and patient connected to nasal cannula oxygen Cardiovascular status: blood pressure returned to baseline and stable Postop Assessment: no headache, no backache and no apparent nausea or vomiting Anesthetic complications: no    Last Vitals:  Vitals:   04/09/19 1415 04/09/19 1430  BP: 121/63 120/64  Pulse: 77 82  Resp: 11 19  Temp:    SpO2: 98% 97%    Last Pain:  Vitals:   04/09/19 1415  TempSrc:   PainSc: Asleep                 Tonny Isensee S

## 2019-04-09 NOTE — Anesthesia Preprocedure Evaluation (Addendum)
Anesthesia Evaluation  Patient identified by MRN, date of birth, ID band Patient awake    Reviewed: Allergy & Precautions, NPO status , Patient's Chart, lab work & pertinent test results  Airway Mallampati: II  TM Distance: >3 FB Neck ROM: Full    Dental no notable dental hx. (+) Dental Advisory Given   Pulmonary neg pulmonary ROS,    Pulmonary exam normal breath sounds clear to auscultation       Cardiovascular hypertension, Normal cardiovascular exam Rhythm:Regular Rate:Normal     Neuro/Psych negative neurological ROS  negative psych ROS   GI/Hepatic Neg liver ROS, GERD  ,  Endo/Other  negative endocrine ROS  Renal/GU negative Renal ROS  negative genitourinary   Musculoskeletal  (+) Arthritis , Osteoarthritis,    Abdominal   Peds negative pediatric ROS (+)  Hematology negative hematology ROS (+)   Anesthesia Other Findings   Reproductive/Obstetrics negative OB ROS                            Anesthesia Physical Anesthesia Plan  ASA: II  Anesthesia Plan: Spinal   Post-op Pain Management:    Induction: Intravenous  PONV Risk Score and Plan: 2 and Ondansetron, Dexamethasone and Treatment may vary due to age or medical condition  Airway Management Planned: Simple Face Mask  Additional Equipment:   Intra-op Plan:   Post-operative Plan:   Informed Consent: I have reviewed the patients History and Physical, chart, labs and discussed the procedure including the risks, benefits and alternatives for the proposed anesthesia with the patient or authorized representative who has indicated his/her understanding and acceptance.     Dental advisory given  Plan Discussed with: CRNA and Surgeon  Anesthesia Plan Comments:         Anesthesia Quick Evaluation

## 2019-04-09 NOTE — Evaluation (Signed)
Physical Therapy Evaluation Patient Details Name: [name] MRN: [id number] DOB: Jun 01, 1950 Today's Date: 04/09/2019   History of Present Illness  Patient is 69 y.o. female s/p Rt THA anterior approach with PMH significant for HTN, HLD, GERD, fibromyalgia.  Clinical Impression  [name] is a 69 y.o. female POD 0 s/p Rt THA. Patient reports independence with mobility at baseline. Patient is now limited by functional impairments (see PT problem list below) and requires min assist for transfers and gait with RW. Patient was able to ambulate ~30 feet with RW and min assist. Patient instructed in exercise to facilitate Le strength and circulation. Patient will benefit from continued skilled PT interventions to address impairments and progress towards PLOF. Acute PT will follow to progress mobility and stair training in preparation for safe discharge home.     Follow Up Recommendations Follow surgeon's recommendation for DC plan and follow-up therapies    Equipment Recommendations  None recommended by PT    Recommendations for Other Services       Precautions / Restrictions Precautions Precautions: Fall Restrictions Weight Bearing Restrictions: No      Mobility  Bed Mobility Overal bed mobility: Needs Assistance Bed Mobility: Supine to Sit     Supine to sit: Min assist;HOB elevated     General bed mobility comments: cues for use of bed rail and assist to bring LE's to EOB.   Transfers Overall transfer level: Needs assistance Equipment used: Rolling Whelpley (2 wheeled) Transfers: Sit to/from Stand Sit to Stand: Min assist         General transfer comment: cues for hand placement and technique with RW, assist to initiate power up and steady.  Ambulation/Gait Ambulation/Gait assistance: Min assist Gait Distance (Feet): 30 Feet Assistive device: Rolling Kitchings (2 wheeled) Gait Pattern/deviations: Step-to pattern;Decreased step length - right;Decreased stance time  - right;Decreased weight shift to right Gait velocity: decreased   General Gait Details: verbal cues for safe proximity to RW and safe step pattern, min assist for safe Tallent positioning and to steady intermittently  Stairs   Wheelchair Mobility    Modified Rankin (Stroke Patients Only)       Balance Overall balance assessment: Needs assistance Sitting-balance support: Feet supported Sitting balance-Leahy Scale: Fair     Standing balance support: During functional activity;Bilateral upper extremity supported Standing balance-Leahy Scale: Poor          Pertinent Vitals/Pain Pain Assessment: 0-10 Pain Score: 4  Pain Location: Rt hip Pain Descriptors / Indicators: Burning Pain Intervention(s): Limited activity within patient's tolerance;Monitored during session;Repositioned;Ice applied    Home Living Family/patient expects to be discharged to:: Private residence Living Arrangements: Alone Available Help at Discharge: Family;Available 24 hours/day Type of Home: House Home Access: Stairs to enter Entrance Stairs-Rails: None Entrance Stairs-Number of Steps: 3 Home Layout: One level;Laundry or work area in [place]: Environmental consultant - 2 wheels;Cane - single point;Shower seat      Prior Function Level of Independence: Independent               Hand Dominance   Dominant Hand: Right    Extremity/Trunk Assessment   Upper Extremity Assessment Upper Extremity Assessment: Overall WFL for tasks assessed    Lower Extremity Assessment Lower Extremity Assessment: RLE deficits/detail RLE Deficits / Details: good quad activation, 4/5 for strength with MMT RLE Sensation: WNL RLE Coordination: WNL    Cervical / Trunk Assessment Cervical / Trunk Assessment: Normal  Communication   Communication: No difficulties  Cognition Arousal/Alertness:  Awake/alert Behavior During Therapy: WFL for tasks assessed/performed Overall Cognitive Status: Within Functional  Limits for tasks assessed               General Comments      Exercises Total Joint Exercises Ankle Circles/Pumps: AROM;15 reps;Both;Seated Quad Sets: AROM;Right;5 reps;Seated   Assessment/Plan    PT Assessment Patient needs continued PT services  PT Problem List Decreased range of motion;Decreased strength;Decreased activity tolerance;Decreased balance;Decreased mobility;Decreased knowledge of use of DME       PT Treatment Interventions DME instruction;Gait training;Stair training;Functional mobility training;Therapeutic activities;Therapeutic exercise;Balance training;Patient/family education    PT Goals (Current goals can be found in the Care Plan section)  Acute Rehab PT Goals Patient Stated Goal: return home and start walking again PT Goal Formulation: With patient Time For Goal Achievement: 04/16/19 Potential to Achieve Goals: Good    Frequency 7X/week    AM-PAC PT "6 Clicks" Mobility  Outcome Measure Help needed turning from your back to your side while in a flat bed without using bedrails?: A Little Help needed moving from lying on your back to sitting on the side of a flat bed without using bedrails?: A Little Help needed moving to and from a bed to a chair (including a wheelchair)?: A Little Help needed standing up from a chair using your arms (e.g., wheelchair or bedside chair)?: A Little Help needed to walk in hospital room?: A Little Help needed climbing 3-5 steps with a railing? : A Little 6 Click Score: 18    End of Session Equipment Utilized During Treatment: Gait belt Activity Tolerance: Patient tolerated treatment well Patient left: in chair;with call bell/phone within reach;with chair alarm set Nurse Communication: Mobility status PT Visit Diagnosis: Muscle weakness (generalized) (M62.81);Difficulty in walking, not elsewhere classified (R26.2)    Time: [id number] PT Time Calculation (min) (ACUTE ONLY): 19 min   Charges:   PT Evaluation $PT  Eval Low Complexity: 1 Low          Verner Mould, DPT Physical Therapist with [facility] [phone]  04/09/2019 6:56 PM

## 2019-04-10 ENCOUNTER — Encounter: Payer: Self-pay | Admitting: *Deleted

## 2019-04-10 DIAGNOSIS — M1611 Unilateral primary osteoarthritis, right hip: Secondary | ICD-10-CM | POA: Diagnosis not present

## 2019-04-10 LAB — CBC
HCT: 33.6 % — ABNORMAL LOW (ref 36.0–46.0)
Hemoglobin: 10.9 g/dL — ABNORMAL LOW (ref 12.0–15.0)
MCH: 30 pg (ref 26.0–34.0)
MCHC: 32.4 g/dL (ref 30.0–36.0)
MCV: 92.6 fL (ref 80.0–100.0)
Platelets: 296 10*3/uL (ref 150–400)
RBC: 3.63 MIL/uL — ABNORMAL LOW (ref 3.87–5.11)
RDW: 13.7 % (ref 11.5–15.5)
WBC: 13.6 10*3/uL — ABNORMAL HIGH (ref 4.0–10.5)
nRBC: 0 % (ref 0.0–0.2)

## 2019-04-10 LAB — BASIC METABOLIC PANEL
Anion gap: 7 (ref 5–15)
BUN: 11 mg/dL (ref 8–23)
CO2: 28 mmol/L (ref 22–32)
Calcium: 8.6 mg/dL — ABNORMAL LOW (ref 8.9–10.3)
Chloride: 104 mmol/L (ref 98–111)
Creatinine, Ser: 0.62 mg/dL (ref 0.44–1.00)
GFR calc Af Amer: >60 mL/min (ref >60)
GFR calc non Af Amer: >60 mL/min (ref >60)
Glucose, Bld: 203 mg/dL — ABNORMAL HIGH (ref 70–99)
Potassium: 4.4 mmol/L (ref 3.5–5.1)
Sodium: 139 mmol/L (ref 135–145)

## 2019-04-10 LAB — TYPE AND SCREEN
ABO/RH(D): A POS
Antibody Screen: NEGATIVE

## 2019-04-10 MED ORDER — HYDROCODONE-ACETAMINOPHEN 5-325 MG PO TABS: 1.0000 | ORAL_TABLET | ORAL | 0 refills | Status: DC | PRN

## 2019-04-10 MED ORDER — ASPIRIN 81 MG PO CHEW: 81.0000 mg | CHEWABLE_TABLET | Freq: Two times a day (BID) | ORAL | 0 refills | Status: AC

## 2019-04-10 MED ORDER — POLYETHYLENE GLYCOL 3350 17 G PO PACK: 17.0000 g | PACK | Freq: Two times a day (BID) | ORAL | 0 refills | Status: DC

## 2019-04-10 MED ORDER — DOCUSATE SODIUM 100 MG PO CAPS: 100.0000 mg | ORAL_CAPSULE | Freq: Two times a day (BID) | ORAL | 0 refills | Status: DC

## 2019-04-10 MED ORDER — FERROUS SULFATE 325 (65 FE) MG PO TABS: 325.0000 mg | ORAL_TABLET | Freq: Three times a day (TID) | ORAL | 0 refills | Status: DC

## 2019-04-10 MED ORDER — METHOCARBAMOL 500 MG PO TABS: 500.0000 mg | ORAL_TABLET | Freq: Four times a day (QID) | ORAL | 0 refills | Status: DC | PRN

## 2019-04-10 NOTE — Progress Notes (Signed)
Physical Therapy Treatment Patient Details Name: [name] MRN: [id number] DOB: [birth year] Today's Date: 04/10/2019    History of Present Illness Patient is 69 y.o. female s/p Rt THA anterior approach with PMH significant for HTN, HLD, GERD, fibromyalgia.    PT Comments    Pt assisted with ambulating and safe stair technique.  Will return for afternoon session to ambulate again and educate on exercises.    Follow Up Recommendations  Follow surgeon's recommendation for DC plan and follow-up therapies     Equipment Recommendations  None recommended by PT    Recommendations for Other Services       Precautions / Restrictions Precautions Precautions: Fall Restrictions Weight Bearing Restrictions: No    Mobility  Bed Mobility               General bed mobility comments: pt in recliner  Transfers Overall transfer level: Needs assistance Equipment used: Rolling Sjogren (2 wheeled) Transfers: Sit to/from Stand Sit to Stand: Min guard         General transfer comment: cues for hand placement and technique with RW  Ambulation/Gait Ambulation/Gait assistance: Min guard Gait Distance (Feet): 120 Feet Assistive device: Rolling Leffel (2 wheeled) Gait Pattern/deviations: Step-to pattern;Decreased stance time - right;Decreased weight shift to right;Antalgic Gait velocity: decreased   General Gait Details: verbal cues for safe proximity to RW and posture, step length, heel strike   Stairs Stairs: Yes Stairs assistance: Min guard Stair Management: Step to pattern;Backwards;With Virrueta Number of Stairs: 2 General stair comments: verbal cues for safety and sequence; pt performed twice   Wheelchair Mobility    Modified Rankin (Stroke Patients Only)       Balance                                            Cognition Arousal/Alertness: Awake/alert Behavior During Therapy: WFL for tasks assessed/performed Overall Cognitive Status:  Within Functional Limits for tasks assessed                                        Exercises      General Comments        Pertinent Vitals/Pain Pain Assessment: 0-10 Pain Score: 4  Pain Location: Rt hip Pain Descriptors / Indicators: Aching;Sore Pain Intervention(s): Repositioned;Monitored during session    Home Living                      Prior Function            PT Goals (current goals can now be found in the care plan section) Progress towards PT goals: Progressing toward goals    Frequency    7X/week      PT Plan Current plan remains appropriate    Co-evaluation              AM-PAC PT "6 Clicks" Mobility   Outcome Measure  Help needed turning from your back to your side while in a flat bed without using bedrails?: A Little Help needed moving from lying on your back to sitting on the side of a flat bed without using bedrails?: A Little Help needed moving to and from a bed to a chair (including a wheelchair)?: A Little Help needed standing up from a chair using  your arms (e.g., wheelchair or bedside chair)?: A Little Help needed to walk in hospital room?: A Little Help needed climbing 3-5 steps with a railing? : A Little 6 Click Score: 18    End of Session Equipment Utilized During Treatment: Gait belt Activity Tolerance: Patient tolerated treatment well Patient left: in chair;with call bell/phone within reach Nurse Communication: Mobility status PT Visit Diagnosis: Muscle weakness (generalized) (M62.81);Difficulty in walking, not elsewhere classified (R26.2)     Time: [id number] PT Time Calculation (min) (ACUTE ONLY): 20 min  Charges:  $Gait Training: 8-22 mins                     Arlyce Dice, DPT Acute Rehabilitation Services Office: [phone]   Trena Platt 04/10/2019, 12:32 PM

## 2019-04-10 NOTE — Progress Notes (Signed)
     Subjective: 1 Day Post-Op Procedure(s) (LRB): RIGHT TOTAL HIP ARTHROPLASTY ANTERIOR APPROACH (Right)   Patient reports pain as mild, pain controlled.  No reported events throughout the night.  Discussed the procedures and expectations moving forward.  Ready to be discharged home, if she does well with PT.     Objective:   VITALS:   Vitals:   04/10/19 0144 04/10/19 0520  BP: 117/61 107/66  Pulse: 90 87  Resp: 14 14  Temp: (!) 97.5 F (36.4 C) 97.7 F (36.5 C)  SpO2: 98% 98%    Dorsiflexion/Plantar flexion intact Incision: dressing C/D/I No cellulitis present Compartment soft  LABS Recent Labs    04/09/19 0914 04/10/19 0352  HGB 13.4 10.9*  HCT 40.5 33.6*  WBC 8.2 13.6*  PLT 375 296    Recent Labs    04/09/19 0914 04/10/19 0352  NA 140 139  K 3.3* 4.4  BUN 12 11  CREATININE 0.76 0.62  GLUCOSE 162* 203*     Assessment/Plan: 1 Day Post-Op Procedure(s) (LRB): RIGHT TOTAL HIP ARTHROPLASTY ANTERIOR APPROACH (Right) Foley cath d/c'ed Advance diet Up with therapy D/C IV fluids Discharge home Follow up in 2 weeks at [facility] Follow up with OLIN,Maxim Bedel D in 2 weeks.  Contact information:  [facility] [address] [place] [phone]      Obese (BMI 30-39.9) Estimated body mass index is 33.15 kg/m as calculated from the following:   Height as of this encounter: 5\' 4"  (1.626 m).   Weight as of this encounter: 87.6 kg. Patient also counseled that weight may inhibit the healing process Patient counseled that losing weight will help with future health issues           Danae Orleans PA-C  [facility]  [place] [address], [address], [place], [place] [address] Phone: [phone] [email or web address] Facebook  [employer]

## 2019-04-10 NOTE — Plan of Care (Signed)
All discharge instructions were given to Pt. All questions were answered.

## 2019-04-10 NOTE — Plan of Care (Signed)
  Problem: Education: Goal: Knowledge of the prescribed therapeutic regimen will improve Outcome: Progressing Goal: Understanding of discharge needs will improve Outcome: Progressing Goal: Individualized Educational Video(s) Outcome: Progressing   Problem: Activity: Goal: Ability to avoid complications of mobility impairment will improve Outcome: Progressing Goal: Ability to tolerate increased activity will improve Outcome: Progressing   Problem: Pain Management: Goal: Pain level will decrease with appropriate interventions Outcome: Progressing   

## 2019-04-10 NOTE — Progress Notes (Signed)
Physical Therapy Treatment Patient Details Name: [name] MRN: [id number] DOB: 17-Aug-1950 Today's Date: 04/10/2019    History of Present Illness Patient is 69 y.o. female s/p Rt THA anterior approach with PMH significant for HTN, HLD, GERD, fibromyalgia.    PT Comments    Pt assisted with ambulating and practicing steps again.  Pt also performed LE exercises.  Pt provided with HEP and stair handouts.  Pt feels ready for d/c home today.    Follow Up Recommendations  Follow surgeon's recommendation for DC plan and follow-up therapies     Equipment Recommendations  None recommended by PT    Recommendations for Other Services       Precautions / Restrictions Precautions Precautions: Fall Restrictions Weight Bearing Restrictions: No    Mobility  Bed Mobility Overal bed mobility: Needs Assistance Bed Mobility: Sit to Supine       Sit to supine: Min assist   General bed mobility comments: assist for R LE onto bed  Transfers Overall transfer level: Needs assistance Equipment used: Rolling Greggs (2 wheeled) Transfers: Sit to/from Stand Sit to Stand: Min guard         General transfer comment: cues for hand placement and technique with RW  Ambulation/Gait Ambulation/Gait assistance: Min guard Gait Distance (Feet): 120 Feet Assistive device: Rolling Whitehead (2 wheeled) Gait Pattern/deviations: Decreased stance time - right;Decreased weight shift to right;Antalgic;Step-through pattern;Step-to pattern Gait velocity: decreased   General Gait Details: verbal cues for safe proximity to RW and posture, step length, heel strike   Stairs Stairs: Yes Stairs assistance: Min guard Stair Management: Step to pattern;Backwards;With Bynum Number of Stairs: 2 General stair comments: pt wished to practice again before d/c; verbal cues for safety and sequence; provided handout   Wheelchair Mobility    Modified Rankin (Stroke Patients Only)       Balance                                             Cognition Arousal/Alertness: Awake/alert Behavior During Therapy: WFL for tasks assessed/performed Overall Cognitive Status: Within Functional Limits for tasks assessed                                        Exercises Total Joint Exercises Ankle Circles/Pumps: AROM;Both;10 reps Quad Sets: AROM;Both;10 reps Short Arc Quad: AROM;Right;10 reps Heel Slides: AAROM;Right;10 reps;Supine Hip ABduction/ADduction: AAROM;Right;10 reps;Supine Long Arc Quad: AROM;Right;Seated;10 reps    General Comments        Pertinent Vitals/Pain Pain Assessment: 0-10 Pain Score: 5  Pain Location: Rt hip Pain Descriptors / Indicators: Aching;Sore Pain Intervention(s): Repositioned;Monitored during session;Premedicated before session    Home Living                      Prior Function            PT Goals (current goals can now be found in the care plan section) Progress towards PT goals: Progressing toward goals    Frequency    7X/week      PT Plan Current plan remains appropriate    Co-evaluation              AM-PAC PT "6 Clicks" Mobility   Outcome Measure  Help needed turning from your back to your  side while in a flat bed without using bedrails?: A Little Help needed moving from lying on your back to sitting on the side of a flat bed without using bedrails?: A Little Help needed moving to and from a bed to a chair (including a wheelchair)?: A Little Help needed standing up from a chair using your arms (e.g., wheelchair or bedside chair)?: A Little Help needed to walk in hospital room?: A Little Help needed climbing 3-5 steps with a railing? : A Little 6 Click Score: 18    End of Session Equipment Utilized During Treatment: Gait belt Activity Tolerance: Patient tolerated treatment well Patient left: with call bell/phone within reach;in bed Nurse Communication: Mobility status PT Visit  Diagnosis: Muscle weakness (generalized) (M62.81);Difficulty in walking, not elsewhere classified (R26.2)     Time: [id number] PT Time Calculation (min) (ACUTE ONLY): 26 min  Charges:  $Gait Training: 8-22 mins $Therapeutic Exercise: 8-22 mins                    Arlyce Dice, DPT Acute Rehabilitation Services Office: [phone]   Trena Platt 04/10/2019, 2:14 PM

## 2019-04-17 NOTE — Discharge Summary (Signed)
Physician Discharge Summary  Patient ID: [name] MRN: [id number] DOB/AGE: 1950/02/10 69 y.o.  Admit date: 04/09/2019 Discharge date: 04/10/2019   Procedures:  Procedure(s) (LRB): RIGHT TOTAL HIP ARTHROPLASTY ANTERIOR APPROACH (Right)  Attending Physician:  Dr. Paralee Cancel   Admission Diagnoses:   Right hip primary OA / pain  Discharge Diagnoses:  Principal Problem:   S/P right THA, AA Active Problems:   Obesity (BMI 30-39.9)  Past Medical History:  Diagnosis Date  . Fibromyalgia   . GERD (gastroesophageal reflux disease)   . Hyperlipidemia   . Hypertension     HPI:    [name], 69 y.o. female, has a history of pain and functional disability in the right hip(s) due to arthritis and patient has failed non-surgical conservative treatments for greater than 12 weeks to include NSAID's and/or analgesics, corticosteriod injections, use of assistive devices and activity modification.  Onset of symptoms was gradual starting >10 years ago with gradually worsening course since that time.The patient noted no past surgery on the right hip(s).  Patient currently rates pain in the right hip at 9 out of 10 with activity. Patient has worsening of pain with activity and weight bearing, trendelenberg gait, pain that interfers with activities of daily living and pain with passive range of motion. Patient has evidence of periarticular osteophytes and joint space narrowing by imaging studies. This condition presents safety issues increasing the risk of falls. There is no current active infection.  Risks, benefits and expectations were discussed with the patient.  Risks including but not limited to the risk of anesthesia, blood clots, nerve damage, blood vessel damage, failure of the prosthesis, infection and up to and including death.  Patient understand the risks, benefits and expectations and wishes to proceed with surgery.   PCP: Sharion Balloon, FNP   Discharged Condition:  good  Hospital Course:  Patient underwent the above stated procedure on 04/09/2019. Patient tolerated the procedure well and brought to the recovery room in good condition and subsequently to the floor.  POD #1 BP: 107/66 ; Pulse: 87 ; Temp: 97.7 F (36.5 C) ; Resp: 14 Patient reports pain as mild, pain controlled.  No reported events throughout the night.  Discussed the procedures and expectations moving forward.  Ready to be discharged home. Dorsiflexion/plantar flexion intact, incision: dressing C/D/I, no cellulitis present and compartment soft.   LABS  Basename    HGB     10.9  HCT     33.6    Discharge Exam: General appearance: alert, cooperative and no distress Extremities: Homans sign is negative, no sign of DVT, no edema, redness or tenderness in the calves or thighs and no ulcers, gangrene or trophic changes  Disposition: Home with follow up in 2 weeks   Follow-up Information    Paralee Cancel, MD. Schedule an appointment as soon as possible for a visit in 2 weeks.   Specialty: Orthopedic Surgery Contact information: [address] [place] [address] [id number]           Discharge Instructions    Call MD / Call 911   Complete by: As directed    If you experience chest pain or shortness of breath, CALL 911 and be transported to the hospital emergency room.  If you develope a fever above 101 F, pus (white drainage) or increased drainage or redness at the wound, or calf pain, call your surgeon's office.   Change dressing   Complete by: As directed  Maintain surgical dressing until follow up in the clinic. If the edges start to pull up, may reinforce with tape. If the dressing is no longer working, may remove and cover with gauze and tape, but must keep the area dry and clean.  Call with any questions or concerns.   Constipation Prevention   Complete by: As directed    Drink plenty of fluids.  Prune juice may be helpful.  You may use a stool  softener, such as Colace (over the counter) 100 mg twice a day.  Use MiraLax (over the counter) for constipation as needed.   Diet - low sodium heart healthy   Complete by: As directed    Discharge instructions   Complete by: As directed    Maintain surgical dressing until follow up in the clinic. If the edges start to pull up, may reinforce with tape. If the dressing is no longer working, may remove and cover with gauze and tape, but must keep the area dry and clean.  Follow up in 2 weeks at [facility]. Call with any questions or concerns.   Increase activity slowly as tolerated   Complete by: As directed    Weight bearing as tolerated with assist device (Speros, cane, etc) as directed, use it as long as suggested by your surgeon or therapist, typically at least 4-6 weeks.   TED hose   Complete by: As directed    Use stockings (TED hose) for 2 weeks on both leg(s).  You may remove them at night for sleeping.      Allergies as of 04/10/2019   No Known Allergies     Medication List    STOP taking these medications   aspirin 81 MG tablet Replaced by: aspirin 81 MG chewable tablet   ibuprofen 200 MG tablet Commonly known as: ADVIL     TAKE these medications   aspirin 81 MG chewable tablet Commonly known as: Aspirin Childrens Chew 1 tablet (81 mg total) by mouth 2 (two) times daily. Take for 4 weeks, then resume regular dose. Replaces: aspirin 81 MG tablet   atorvastatin 40 MG tablet Commonly known as: LIPITOR Take 1 tablet (40 mg total) by mouth daily. (Needs to be seen before next refill)   carboxymethylcellulose 0.5 % Soln Commonly known as: REFRESH PLUS Place 1 drop into both eyes 2 (two) times daily as needed.   docusate sodium 100 MG capsule Commonly known as: Colace Take 1 capsule (100 mg total) by mouth 2 (two) times daily.   ferrous sulfate 325 (65 FE) MG tablet Commonly known as: FerrouSul Take 1 tablet (325 mg total) by mouth 3 (three) times daily with meals  for 14 days.   fluorometholone 0.1 % ophthalmic suspension Commonly known as: FML Place 1 drop into both eyes daily.   gabapentin 100 MG capsule Commonly known as: NEURONTIN Take 1 capsule (100 mg total) by mouth 2 (two) times daily. What changed: when to take this   hydrochlorothiazide 25 MG tablet Commonly known as: HYDRODIURIL Take 1 tablet (25 mg total) by mouth daily. (Needs to be seen before next refill)   HYDROcodone-acetaminophen 5-325 MG tablet Commonly known as: Norco Take 1-2 tablets by mouth every 4 (four) hours as needed for moderate pain or severe pain.   methocarbamol 500 MG tablet Commonly known as: Robaxin Take 1 tablet (500 mg total) by mouth every 6 (six) hours as needed for muscle spasms.   metoprolol succinate 50 MG 24 hr tablet Commonly known as: TOPROL-XL Take  1 tablet (50 mg total) by mouth daily. (Needs to be seen before next refill)   omeprazole 20 MG capsule Commonly known as: PRILOSEC Take 20 mg by mouth daily.   polyethylene glycol 17 g packet Commonly known as: MIRALAX / GLYCOLAX Take 17 g by mouth 2 (two) times daily.   vitamin C 1000 MG tablet Take 1,000 mg by mouth 2 (two) times a day.   zinc gluconate 50 MG tablet Take 50 mg by mouth daily.            Discharge Care Instructions  (From admission, onward)         Start     Ordered   04/10/19 0000  Change dressing    Comments: Maintain surgical dressing until follow up in the clinic. If the edges start to pull up, may reinforce with tape. If the dressing is no longer working, may remove and cover with gauze and tape, but must keep the area dry and clean.  Call with any questions or concerns.   04/10/19 0912           Signed: West Pugh. Symiah Nowotny   PA-C  04/17/2019, 7:53 AM

## 2019-04-24 NOTE — H&P (Signed)
TOTAL HIP ADMISSION H&P  Patient is admitted for left total hip arthroplasty, anterior approach.  Subjective:  Chief Complaint:   Left hip primary OA / pain  HPI: [name], 69 y.o. female, has a history of pain and functional disability in the left hip(s) due to arthritis and patient has failed non-surgical conservative treatments for greater than 12 weeks to include NSAID's and/or analgesics, corticosteriod injections, use of assistive devices and activity modification.  Onset of symptoms was gradual starting >10 years ago with gradually worsening course since that time.The patient noted prior procedures of the hip to include arthroplasty on the right hip(s).  Patient currently rates pain in the left hip at 9 out of 10 with activity. Patient has night pain, worsening of pain with activity and weight bearing, trendelenberg gait, pain that interfers with activities of daily living and pain with passive range of motion. Patient has evidence of periarticular osteophytes and joint space narrowing by imaging studies. This condition presents safety issues increasing the risk of falls.  There is no current active infection.  Risks, benefits and expectations were discussed with the patient.  Risks including but not limited to the risk of anesthesia, blood clots, nerve damage, blood vessel damage, failure of the prosthesis, infection and up to and including death.  Patient understand the risks, benefits and expectations and wishes to proceed with surgery.   PCP: Theresa Balloon, FNP  D/C Plans:       Home   Post-op Meds:       No Rx given   Tranexamic Acid:      To be given - IV   Decadron:      Is to be given  FYI:      ASA  Norco  DME:   Pt already has equipment   PT:   HEP  Pharmacy: [name]     Patient Active Problem List   Diagnosis Date Noted  . S/P right THA, AA 04/09/2019  . Family history of melanoma 10/16/2018  . Atypical mole 10/16/2018  . Symptomatic  mammary hypertrophy 06/01/2018  . Chronic bilateral thoracic back pain 06/01/2018  . Neck pain 06/01/2018  . Metabolic syndrome [id number]  . Unilateral primary osteoarthritis, left knee 02/02/2016  . Unilateral primary osteoarthritis, right hip 02/02/2016  . Obesity (BMI 30-39.9) 11/12/2015  . Vitamin D deficiency 02/24/2014  . Essential hypertension 02/21/2014  . Hyperlipidemia 02/21/2014  . GERD (gastroesophageal reflux disease) 02/21/2014   Past Medical History:  Diagnosis Date  . Fibromyalgia   . GERD (gastroesophageal reflux disease)   . Hyperlipidemia   . Hypertension     Past Surgical History:  Procedure Laterality Date  . BREAST REDUCTION SURGERY Bilateral 08/09/2018   Procedure: MAMMARY REDUCTION  (BREAST);  Surgeon: Wallace Going, DO;  Location: [facility];  Service: Plastics;  Laterality: Bilateral;  . CARPAL TUNNEL RELEASE Bilateral   . CHOLECYSTECTOMY    . COLONOSCOPY  2001  . TOTAL HIP ARTHROPLASTY Right 04/09/2019   Procedure: RIGHT TOTAL HIP ARTHROPLASTY ANTERIOR APPROACH;  Surgeon: Paralee Cancel, MD;  Location: [facility];  Service: Orthopedics;  Laterality: Right;  70 mins  . TUBAL LIGATION    . 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    . UPPER GASTROINTESTINAL ENDOSCOPY      No current facility-administered medications for this encounter.   Current Outpatient Medications  Medication Sig Dispense Refill Last Dose  . Ascorbic Acid (VITAMIN C) 1000 MG tablet Take 1,000 mg by mouth  2 (two) times a day.      Marland Kitchen aspirin (ASPIRIN CHILDRENS) 81 MG chewable tablet Chew 1 tablet (81 mg total) by mouth 2 (two) times daily. Take for 4 weeks, then resume regular dose. 60 tablet 0   . atorvastatin (LIPITOR) 40 MG tablet Take 1 tablet (40 mg total) by mouth daily. (Needs to be seen before next refill) 90 tablet 3   . carboxymethylcellulose (REFRESH PLUS) 0.5 % SOLN Place 1 drop into both eyes 2 (two) times daily as needed.     . docusate sodium (COLACE) 100 MG capsule Take  1 capsule (100 mg total) by mouth 2 (two) times daily. 28 capsule 0   . fluorometholone (FML) 0.1 % ophthalmic suspension Place 1 drop into both eyes daily.     Marland Kitchen gabapentin (NEURONTIN) 100 MG capsule Take 1 capsule (100 mg total) by mouth 2 (two) times daily. (Patient taking differently: Take 100 mg by mouth at bedtime. ) 180 capsule 3   . hydrochlorothiazide (HYDRODIURIL) 25 MG tablet Take 1 tablet (25 mg total) by mouth daily. (Needs to be seen before next refill) 90 tablet 3   . HYDROcodone-acetaminophen (NORCO) 5-325 MG tablet Take 1-2 tablets by mouth every 4 (four) hours as needed for moderate pain or severe pain. 60 tablet 0   . methocarbamol (ROBAXIN) 500 MG tablet Take 1 tablet (500 mg total) by mouth every 6 (six) hours as needed for muscle spasms. 40 tablet 0   . metoprolol succinate (TOPROL-XL) 50 MG 24 hr tablet Take 1 tablet (50 mg total) by mouth daily. (Needs to be seen before next refill) 90 tablet 3   . omeprazole (PRILOSEC) 20 MG capsule Take 20 mg by mouth daily.     . ondansetron (ZOFRAN) 4 MG tablet Take 4 mg by mouth every 8 (eight) hours as needed for nausea or vomiting.     . polyethylene glycol (MIRALAX / GLYCOLAX) 17 g packet Take 17 g by mouth 2 (two) times daily. 28 packet 0   . zinc gluconate 50 MG tablet Take 50 mg by mouth daily.     . ferrous sulfate (FERROUSUL) 325 (65 FE) MG tablet Take 1 tablet (325 mg total) by mouth 3 (three) times daily with meals for 14 days. (Patient not taking: Reported on 04/23/2019) 42 tablet 0 Not Taking at Unknown time   No Known Allergies   Social History   Tobacco Use  . Smoking status: Never Smoker  . Smokeless tobacco: Never Used  Substance Use Topics  . Alcohol use: No    Family History  Problem Relation Age of Onset  . Hypertension Father   . Heart disease Father   . Cancer Father   . Hypertension Mother   . Heart disease Mother   . Diabetes Mother   . Hypertension Brother   . Hypertension Sister   . Colon cancer Neg  Hx   . Breast cancer Neg Hx      Review of Systems  Constitutional: Negative.   HENT: Negative.   Eyes: Negative.   Respiratory: Negative.   Cardiovascular: Negative.   Gastrointestinal: Positive for heartburn.  Genitourinary: Negative.   Musculoskeletal: Positive for joint pain.  Skin: Negative.   Neurological: Negative.   Endo/Heme/Allergies: Negative.   Psychiatric/Behavioral: Negative.       Objective:  Physical Exam  Constitutional: She is oriented to person, place, and time. She appears well-developed.  HENT:  Head: Normocephalic.  Eyes: Pupils are equal, round, and reactive to light.  Neck: No JVD present. No tracheal deviation present. No thyromegaly present.  Cardiovascular: Normal rate, regular rhythm and intact distal pulses.  Respiratory: Effort normal and breath sounds normal. No respiratory distress. She has no wheezes.  GI: Soft. There is no abdominal tenderness. There is no guarding.  Musculoskeletal:     Cervical back: Neck supple.     Left hip: Tenderness and bony tenderness present. No swelling, deformity or lacerations. Decreased range of motion. Decreased strength.  Lymphadenopathy:    She has no cervical adenopathy.  Neurological: She is alert and oriented to person, place, and time. A sensory deficit (occasional numbness in LEs) is present.  Skin: Skin is warm and dry.  Psychiatric: She has a normal mood and affect.     Labs:  Estimated body mass index is 33.15 kg/m as calculated from the following:   Height as of 04/09/19: 5\' 4"  (1.626 m).   Weight as of 04/09/19: 87.6 kg.   Imaging Review Plain radiographs demonstrate severe degenerative joint disease of the left hip. The bone quality appears to be good for age and reported activity level.      Assessment/Plan:  End stage arthritis, left hip  The patient history, physical examination, clinical judgement of the provider and imaging studies are consistent with end stage degenerative  joint disease of the left hip and total hip arthroplasty is deemed medically necessary. The treatment options including medical management, injection therapy, arthroscopy and arthroplasty were discussed at length. The risks and benefits of total hip arthroplasty were presented and reviewed. The risks due to aseptic loosening, infection, stiffness, dislocation/subluxation,  thromboembolic complications and other imponderables were discussed.  The patient acknowledged the explanation, agreed to proceed with the plan and consent was signed. Patient is being admitted for treatment for surgery, pain control, PT, OT, prophylactic antibiotics, VTE prophylaxis, progressive ambulation and ADL's and discharge planning.The patient is planning to be discharged home.     Theresa Pugh Ridhima Golberg   PA-C  04/24/2019, 12:26 PM

## 2019-05-03 NOTE — Patient Instructions (Addendum)
DUE TO COVID-19 ONLY ONE VISITOR IS ALLOWED TO COME WITH YOU AND STAY IN THE WAITING ROOM ONLY DURING PRE OP AND PROCEDURE DAY OF SURGERY. THE 1 VISITOR MAY VISIT WITH YOU AFTER SURGERY IN YOUR PRIVATE ROOM DURING VISITING HOURS ONLY!  YOU NEED TO HAVE A COVID 19 TEST ON 05-10-19 @ 11:00 AM, THIS TEST MUST BE DONE BEFORE SURGERY, COME  [place], [place] , [address].  ([place]) ONCE YOUR COVID TEST IS COMPLETED, PLEASE BEGIN THE QUARANTINE INSTRUCTIONS AS OUTLINED IN YOUR HANDOUT.                JULIEONNA BURIANEK  05/03/2019   Your procedure is scheduled on: 05-14-19   Report to [facility] Main  Entrance    Report to Admitting at 6:10 AM     Call this number if you have problems the morning of surgery [phone]    Remember: NO SOLID FOOD AFTER MIDNIGHT THE NIGHT PRIOR TO SURGERY.    Take these medicines the morning of surgery with A SIP OF WATER: Metoprolol, Omeprazole (Prilosec), Hydrocodone prn, Eyedrops  BRUSH YOUR TEETH MORNING OF SURGERY AND RINSE YOUR MOUTH OUT, NO CHEWING GUM CANDY OR MINTS.                               You may not have any metal on your body including hair pins and              piercings     Do not wear jewelry, make-up, lotions, powders or perfumes, deodorant              Do not wear nail polish on your fingernails.  Do not shave  48 hours prior to surgery.               Do not bring valuables to the hospital. [place].  Contacts, dentures or bridgework may not be worn into surgery.  You may bring overnight bag      Special Instructions: N/A              Please read over the following fact sheets you were given: _____________________________________________________________________             [facility] - Preparing for Surgery Before surgery, you can play an important role.  Because skin is not sterile, your skin needs to be as free of germs as possible.   You can reduce the number of germs on your skin by washing with CHG (chlorahexidine gluconate) soap before surgery.  CHG is an antiseptic cleaner which kills germs and bonds with the skin to continue killing germs even after washing. Please DO NOT use if you have an allergy to CHG or antibacterial soaps.  If your skin becomes reddened/irritated stop using the CHG and inform your nurse when you arrive at Short Stay. Do not shave (including legs and underarms) for at least 48 hours prior to the first CHG shower.  You may shave your face/neck. Please follow these instructions carefully:  1.  Shower with CHG Soap the night before surgery and the  morning of Surgery.  2.  If you choose to wash your hair, wash your hair first as usual with your  normal  shampoo.  3.  After you shampoo, rinse your hair  and body thoroughly to remove the  shampoo.                           4.  Use CHG as you would any other liquid soap.  You can apply chg directly  to the skin and wash                       Gently with a scrungie or clean washcloth.  5.  Apply the CHG Soap to your body ONLY FROM THE NECK DOWN.   Do not use on face/ open                           Wound or open sores. Avoid contact with eyes, ears mouth and genitals (private parts).                       Wash face,  Genitals (private parts) with your normal soap.             6.  Wash thoroughly, paying special attention to the area where your surgery  will be performed.  7.  Thoroughly rinse your body with warm water from the neck down.  8.  DO NOT shower/wash with your normal soap after using and rinsing off  the CHG Soap.                9.  Pat yourself dry with a clean towel.            10.  Wear clean pajamas.            11.  Place clean sheets on your bed the night of your first shower and do not  sleep with pets. Day of Surgery : Do not apply any lotions/deodorants the morning of surgery.  Please wear clean clothes to the hospital/surgery  center.  FAILURE TO FOLLOW THESE INSTRUCTIONS MAY RESULT IN THE CANCELLATION OF YOUR SURGERY PATIENT SIGNATURE_________________________________  NURSE SIGNATURE__________________________________  ________________________________________________________________________

## 2019-05-03 NOTE — Progress Notes (Signed)
Please place surgery orders. Pt scheduled for PAT appointment on 05-06-19.

## 2019-05-03 NOTE — Progress Notes (Signed)
PCP - Evelina Dun, FNP Cardiologist -   Chest x-ray - 04-02-19  EKG - 04-02-19 Stress Test -  ECHO -  Cardiac Cath -   Sleep Study -  CPAP -   Fasting Blood Sugar -  Checks Blood Sugar _____ times a day  Blood Thinner Instructions: Aspirin Instructions: Last Dose:  Anesthesia review:   Patient denies shortness of breath, fever, cough and chest pain at PAT appointment   Patient verbalized understanding of instructions that were given to them at the PAT appointment. Patient was also instructed that they will need to review over the PAT instructions again at home before surgery.

## 2019-05-06 ENCOUNTER — Encounter (HOSPITAL_COMMUNITY)
Admission: RE | Admit: 2019-05-06 | Discharge: 2019-05-06 | Disposition: A | Discharge status: Home / Self Care | Payer: Medicare HMO | Source: Ambulatory Visit | Attending: Orthopedic Surgery | Admitting: Orthopedic Surgery

## 2019-05-06 ENCOUNTER — Other Ambulatory Visit: Payer: Self-pay

## 2019-05-06 ENCOUNTER — Encounter (HOSPITAL_COMMUNITY): Payer: Self-pay

## 2019-05-06 DIAGNOSIS — M1612 Unilateral primary osteoarthritis, left hip: Secondary | ICD-10-CM | POA: Diagnosis not present

## 2019-05-06 DIAGNOSIS — K219 Gastro-esophageal reflux disease without esophagitis: Secondary | ICD-10-CM | POA: Insufficient documentation

## 2019-05-06 DIAGNOSIS — Z79899 Other long term (current) drug therapy: Secondary | ICD-10-CM | POA: Diagnosis not present

## 2019-05-06 DIAGNOSIS — Z7982 Long term (current) use of aspirin: Secondary | ICD-10-CM | POA: Diagnosis not present

## 2019-05-06 DIAGNOSIS — E785 Hyperlipidemia, unspecified: Secondary | ICD-10-CM | POA: Insufficient documentation

## 2019-05-06 DIAGNOSIS — Z01812 Encounter for preprocedural laboratory examination: Secondary | ICD-10-CM | POA: Insufficient documentation

## 2019-05-06 DIAGNOSIS — I10 Essential (primary) hypertension: Secondary | ICD-10-CM | POA: Insufficient documentation

## 2019-05-06 LAB — BASIC METABOLIC PANEL
Anion gap: 11 (ref 5–15)
BUN: 8 mg/dL (ref 8–23)
CO2: 28 mmol/L (ref 22–32)
Calcium: 9.7 mg/dL (ref 8.9–10.3)
Chloride: 101 mmol/L (ref 98–111)
Creatinine, Ser: 0.79 mg/dL (ref 0.44–1.00)
GFR calc Af Amer: >60 mL/min (ref >60)
GFR calc non Af Amer: >60 mL/min (ref >60)
Glucose, Bld: 113 mg/dL — ABNORMAL HIGH (ref 70–99)
Potassium: 3.9 mmol/L (ref 3.5–5.1)
Sodium: 140 mmol/L (ref 135–145)

## 2019-05-06 LAB — CBC
HCT: 39.5 % (ref 36.0–46.0)
Hemoglobin: 12.5 g/dL (ref 12.0–15.0)
MCH: 29.1 pg (ref 26.0–34.0)
MCHC: 31.6 g/dL (ref 30.0–36.0)
MCV: 92.1 fL (ref 80.0–100.0)
Platelets: 460 10*3/uL — ABNORMAL HIGH (ref 150–400)
RBC: 4.29 MIL/uL (ref 3.87–5.11)
RDW: 13.2 % (ref 11.5–15.5)
WBC: 9.3 10*3/uL (ref 4.0–10.5)
nRBC: 0 % (ref 0.0–0.2)

## 2019-05-06 LAB — SURGICAL PCR SCREEN
MRSA, PCR: NEGATIVE
Staphylococcus aureus: NEGATIVE

## 2019-05-10 ENCOUNTER — Other Ambulatory Visit (HOSPITAL_COMMUNITY): Payer: Medicare HMO

## 2019-05-10 DIAGNOSIS — Z471 Aftercare following joint replacement surgery: Secondary | ICD-10-CM | POA: Diagnosis not present

## 2019-05-10 DIAGNOSIS — M25561 Pain in right knee: Secondary | ICD-10-CM | POA: Diagnosis not present

## 2019-05-10 DIAGNOSIS — M1711 Unilateral primary osteoarthritis, right knee: Secondary | ICD-10-CM | POA: Diagnosis not present

## 2019-05-29 DIAGNOSIS — Z471 Aftercare following joint replacement surgery: Secondary | ICD-10-CM | POA: Diagnosis not present

## 2019-05-29 DIAGNOSIS — Z96641 Presence of right artificial hip joint: Secondary | ICD-10-CM | POA: Diagnosis not present

## 2019-06-10 NOTE — H&P (Signed)
TOTAL HIP ADMISSION H&P  Patient is admitted for left total hip arthroplasty, anterior approach.  Subjective:  Chief Complaint: Left hip primary OA / pain  HPI: [name], 69 y.o. female, has a history of pain and functional disability in the left hip(s) due to arthritis and patient has failed non-surgical conservative treatments for greater than 12 weeks to include NSAID's and/or analgesics, corticosteriod injections, use of assistive devices and activity modification.  Onset of symptoms was gradual starting >10 years ago with gradually worsening course since that time.The patient noted prior procedures of the hip to include arthroplasty on the right hip 04/09/2019 per Dr. Alvan Dame.  Patient currently rates pain in the left hip at 8 out of 10 with activity. Patient has worsening of pain with activity and weight bearing, trendelenberg gait, pain that interfers with activities of daily living and pain with passive range of motion. Patient has evidence of periarticular osteophytes and joint space narrowing by imaging studies. This condition presents safety issues increasing the risk of falls. There is no current active infection.  Risks, benefits and expectations were discussed with the patient.  Risks including but not limited to the risk of anesthesia, blood clots, nerve damage, blood vessel damage, failure of the prosthesis, infection and up to and including death.  Patient understand the risks, benefits and expectations and wishes to proceed with surgery.  PCP: Sharion Balloon, FNP  D/C Plans:       Home  Post-op Meds:       No Rx given   Tranexamic Acid:      To be given - IV   Decadron:      Is to be given  FYI:      ASA  [place]  DME:   Pt already has equipment  PT:   [facility]  Pharmacy: [facility]    Patient Active Problem List   Diagnosis Date Noted  . S/P right THA, AA 04/09/2019  . Family history of melanoma 10/16/2018  . Atypical mole 10/16/2018  .  Symptomatic mammary hypertrophy 06/01/2018  . Chronic bilateral thoracic back pain 06/01/2018  . Neck pain 06/01/2018  . Metabolic syndrome [id number]  . Unilateral primary osteoarthritis, left knee 02/02/2016  . Unilateral primary osteoarthritis, right hip 02/02/2016  . Obesity (BMI 30-39.9) 11/12/2015  . Vitamin D deficiency 02/24/2014  . Essential hypertension 02/21/2014  . Hyperlipidemia 02/21/2014  . GERD (gastroesophageal reflux disease) 02/21/2014   Past Medical History:  Diagnosis Date  . Fibromyalgia   . GERD (gastroesophageal reflux disease)   . Hyperlipidemia   . Hypertension     Past Surgical History:  Procedure Laterality Date  . BREAST REDUCTION SURGERY Bilateral 08/09/2018   Procedure: MAMMARY REDUCTION  (BREAST);  Surgeon: Wallace Going, DO;  Location: [facility];  Service: Plastics;  Laterality: Bilateral;  . CARPAL TUNNEL RELEASE Bilateral   . CHOLECYSTECTOMY    . COLONOSCOPY  2001  . TOTAL HIP ARTHROPLASTY Right 04/09/2019   Procedure: RIGHT TOTAL HIP ARTHROPLASTY ANTERIOR APPROACH;  Surgeon: Paralee Cancel, MD;  Location: [facility];  Service: Orthopedics;  Laterality: Right;  70 mins  . TUBAL LIGATION    . 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    . UPPER GASTROINTESTINAL ENDOSCOPY      No current facility-administered medications for this encounter.   Current Outpatient Medications  Medication Sig Dispense Refill Last Dose  . Ascorbic Acid (VITAMIN C) 1000 MG tablet Take 1,000 mg by mouth 2 (two) times a  day.      . atorvastatin (LIPITOR) 40 MG tablet Take 1 tablet (40 mg total) by mouth daily. (Needs to be seen before next refill) 90 tablet 3   . carboxymethylcellulose (REFRESH PLUS) 0.5 % SOLN Place 1 drop into both eyes 2 (two) times daily as needed.     . docusate sodium (COLACE) 100 MG capsule Take 1 capsule (100 mg total) by mouth 2 (two) times daily. 28 capsule 0   . fluorometholone (FML) 0.1 % ophthalmic suspension Place 1 drop into both eyes  daily.     Marland Kitchen gabapentin (NEURONTIN) 100 MG capsule Take 1 capsule (100 mg total) by mouth 2 (two) times daily. (Patient taking differently: Take 100 mg by mouth at bedtime. ) 180 capsule 3   . hydrochlorothiazide (HYDRODIURIL) 25 MG tablet Take 1 tablet (25 mg total) by mouth daily. (Needs to be seen before next refill) 90 tablet 3   . HYDROcodone-acetaminophen (NORCO) 5-325 MG tablet Take 1-2 tablets by mouth every 4 (four) hours as needed for moderate pain or severe pain. 60 tablet 0   . methocarbamol (ROBAXIN) 500 MG tablet Take 1 tablet (500 mg total) by mouth every 6 (six) hours as needed for muscle spasms. 40 tablet 0   . metoprolol succinate (TOPROL-XL) 50 MG 24 hr tablet Take 1 tablet (50 mg total) by mouth daily. (Needs to be seen before next refill) 90 tablet 3   . omeprazole (PRILOSEC) 20 MG capsule Take 20 mg by mouth daily.     . ondansetron (ZOFRAN) 4 MG tablet Take 4 mg by mouth every 8 (eight) hours as needed for nausea or vomiting.     . polyethylene glycol (MIRALAX / GLYCOLAX) 17 g packet Take 17 g by mouth 2 (two) times daily. 28 packet 0   . zinc gluconate 50 MG tablet Take 50 mg by mouth daily.     . ferrous sulfate (FERROUSUL) 325 (65 FE) MG tablet Take 1 tablet (325 mg total) by mouth 3 (three) times daily with meals for 14 days. (Patient not taking: Reported on 04/23/2019) 42 tablet 0 Not Taking at Unknown time   No Known Allergies   Social History   Tobacco Use  . Smoking status: Never Smoker  . Smokeless tobacco: Never Used  Substance Use Topics  . Alcohol use: No    Family History  Problem Relation Age of Onset  . Hypertension Father   . Heart disease Father   . Cancer Father   . Hypertension Mother   . Heart disease Mother   . Diabetes Mother   . Hypertension Brother   . Hypertension Sister   . Colon cancer Neg Hx   . Breast cancer Neg Hx      Review of Systems  Constitutional: Negative.   HENT: Negative.   Eyes: Negative.   Respiratory: Negative.    Cardiovascular: Negative.   Gastrointestinal: Positive for heartburn.  Genitourinary: Negative.   Musculoskeletal: Positive for joint pain.  Skin: Negative.   Neurological: Negative.   Endo/Heme/Allergies: Negative.   Psychiatric/Behavioral: Negative.      Objective:  Physical Exam  Constitutional: She is oriented to person, place, and time. She appears well-developed.  HENT:  Head: Normocephalic.  Eyes: Pupils are equal, round, and reactive to light.  Neck: No JVD present. No tracheal deviation present. No thyromegaly present.  Cardiovascular: Normal rate, regular rhythm and intact distal pulses.  Respiratory: Effort normal and breath sounds normal. No respiratory distress. She has no wheezes.  GI: Soft. There is no abdominal tenderness. There is no guarding.  Musculoskeletal:     Cervical back: Neck supple.     Left hip: Tenderness and bony tenderness present. No swelling, deformity or lacerations. Decreased range of motion. Decreased strength.  Lymphadenopathy:    She has no cervical adenopathy.  Neurological: She is alert and oriented to person, place, and time. A sensory deficit (occasional numbness in the LEs) is present.  Skin: Skin is warm and dry.  Psychiatric: She has a normal mood and affect.      Labs:  Estimated body mass index is 31.63 kg/m as calculated from the following:   Height as of 05/06/19: 5\' 4"  (1.626 m).   Weight as of 05/06/19: 83.6 kg.   Imaging Review Plain radiographs demonstrate severe degenerative joint disease of the left hip. The bone quality appears to be good for age and reported activity level.      Assessment/Plan:  End stage arthritis, left hip  The patient history, physical examination, clinical judgement of the provider and imaging studies are consistent with end stage degenerative joint disease of the left hip and total hip arthroplasty is deemed medically necessary. The treatment options including medical management,  injection therapy, arthroscopy and arthroplasty were discussed at length. The risks and benefits of total hip arthroplasty were presented and reviewed. The risks due to aseptic loosening, infection, stiffness, dislocation/subluxation,  thromboembolic complications and other imponderables were discussed.  The patient acknowledged the explanation, agreed to proceed with the plan and consent was signed. Patient is being admitted for inpatient treatment for surgery, pain control, PT, OT, prophylactic antibiotics, VTE prophylaxis, progressive ambulation and ADL's and discharge planning.The patient is planning to be discharged home.    West Pugh Bralin Garry   PA-C  06/10/2019, 10:12 AM

## 2019-06-11 NOTE — Patient Instructions (Addendum)
DUE TO COVID-19 ONLY ONE VISITOR IS ALLOWED TO COME WITH YOU AND STAY IN THE WAITING ROOM ONLY DURING PRE OP AND PROCEDURE DAY OF SURGERY. THE 2 VISITORS  MAY VISIT WITH YOU AFTER SURGERY IN YOUR PRIVATE ROOM DURING VISITING HOURS ONLY!  YOU NEED TO HAVE A COVID 19 TEST ON    6/4_ @_10 :10______, THIS TEST MUST BE DONE BEFORE SURGERY,  COME  [address], [place] , [address].  ([place])  ONCE YOUR COVID TEST IS COMPLETED, PLEASE BEGIN THE QUARANTINE INSTRUCTIONS AS OUTLINED IN YOUR HANDOUT.                [name]    Your procedure is scheduled on: 06/25/19   Report to [facility] Main  Entrance   Report to admitting at   9:00 AM     Call this number if you have problems the morning of surgery [phone]   . BRUSH YOUR TEETH MORNING OF SURGERY AND RINSE YOUR MOUTH OUT, NO CHEWING GUM CANDY OR MINTS.    Do not eat food After Midnight.   YOU MAY HAVE CLEAR LIQUIDS FROM MIDNIGHT UNTIL 8:30 AM  . At 8:30 AM Please finish the prescribed Pre-Surgery  drink.   Nothing by mouth after you finish the  drink !   Take these medicines the morning of surgery with A SIP OF WATER: Metoprolol, Prilosec, use your eye drops                                 You may not have any metal on your body including hair pins and              piercings  Do not wear jewelry, make-up, lotions, powders or perfumes, deodorant             Do not wear nail polish on your fingernails.             Do not shave  48 hours prior to surgery.              Do not bring valuables to the hospital. [place].  Contacts, dentures or bridgework may not be worn into surgery.      Patients discharged the day of surgery will not be allowed to drive home.   IF YOU ARE HAVING SURGERY AND GOING HOME THE SAME DAY, YOU MUST HAVE AN ADULT TO DRIVE YOU HOME AND BE WITH YOU FOR 24 HOURS.  YOU MAY GO HOME BY TAXI OR UBER OR ORTHERWISE, BUT  AN ADULT MUST ACCOMPANY YOU HOME AND STAY WITH YOU FOR 24 HOURS.  Name and phone number of your driver:  Special Instructions: N/A              Please read over the following fact sheets you were given: _____________________________________________________________________             [facility] - Preparing for Surgery  Before surgery, you can play an important role .  Because skin is not sterile, your skin needs to be as free of germs as possible.   You can reduce the number of germs on your skin by washing with CHG (chlorahexidine gluconate) soap before surgery.   CHG is an antiseptic cleaner which kills germs and bonds with the skin  to continue killing germs even after washing. Please DO NOT use if you have an allergy to CHG or antibacterial soaps.   If your skin becomes reddened/irritated stop using the CHG and inform your nurse when you arrive at Short Stay. Do not shave (including legs and underarms) for at least 48 hours prior to the first CHG shower.   Please follow these instructions carefully:  1.  Shower with CHG Soap the night before surgery and the  morning of Surgery.  2.  If you choose to wash your hair, wash your hair first as usual with your  normal  shampoo.  3.  After you shampoo, rinse your hair and body thoroughly to remove the  shampoo.                                        4.  Use CHG as you would any other liquid soap.  You can apply chg directly  to the skin and wash                       Gently with a scrungie or clean washcloth.  5.  Apply the CHG Soap to your body ONLY FROM THE NECK DOWN.   Do not use on face/ open                           Wound or open sores. Avoid contact with eyes, ears mouth and genitals (private parts).                       Wash face,  Genitals (private parts) with your normal soap.             6.  Wash thoroughly, paying special attention to the area where your surgery  will be performed.  7.  Thoroughly rinse your body with warm  water from the neck down.  8.  DO NOT shower/wash with your normal soap after using and rinsing off  the CHG Soap.             9.  Pat yourself dry with a clean towel.            10.  Wear clean pajamas.            11.  Place clean sheets on your bed the night of your first shower and do not  sleep with pets. Day of Surgery : Do not apply any lotions/deodorants the morning of surgery.  Please wear clean clothes to the hospital/surgery center.  FAILURE TO FOLLOW THESE INSTRUCTIONS MAY RESULT IN THE CANCELLATION OF YOUR SURGERY PATIENT SIGNATURE_________________________________  NURSE SIGNATURE__________________________________  ________________________________________________________________________   Adam Phenix  An incentive spirometer is a tool that can help keep your lungs clear and active. This tool measures how well you are filling your lungs with each breath. Taking long deep breaths may help reverse or decrease the chance of developing breathing (pulmonary) problems (especially infection) following:  A long period of time when you are unable to move or be active. BEFORE THE PROCEDURE   If the spirometer includes an indicator to show your best effort, your nurse or respiratory therapist will set it to a desired goal.  If possible, sit up straight or lean slightly forward. Try not to slouch.  Hold the incentive spirometer in an  upright position. INSTRUCTIONS FOR USE  1. Sit on the edge of your bed if possible, or sit up as far as you can in bed or on a chair. 2. Hold the incentive spirometer in an upright position. 3. Breathe out normally. 4. Place the mouthpiece in your mouth and seal your lips tightly around it. 5. Breathe in slowly and as deeply as possible, raising the piston or the ball toward the top of the column. 6. Hold your breath for 3-5 seconds or for as long as possible. Allow the piston or ball to fall to the bottom of the column. 7. Remove the  mouthpiece from your mouth and breathe out normally. 8. Rest for a few seconds and repeat Steps 1 through 7 at least 10 times every 1-2 hours when you are awake. Take your time and take a few normal breaths between deep breaths. 9. The spirometer may include an indicator to show your best effort. Use the indicator as a goal to work toward during each repetition. 10. After each set of 10 deep breaths, practice coughing to be sure your lungs are clear. If you have an incision (the cut made at the time of surgery), support your incision when coughing by placing a pillow or rolled up towels firmly against it. Once you are able to get out of bed, walk around indoors and cough well. You may stop using the incentive spirometer when instructed by your caregiver.  RISKS AND COMPLICATIONS  Take your time so you do not get dizzy or light-headed.  If you are in pain, you may need to take or ask for pain medication before doing incentive spirometry. It is harder to take a deep breath if you are having pain. AFTER USE  Rest and breathe slowly and easily.  It can be helpful to keep track of a log of your progress. Your caregiver can provide you with a simple table to help with this. If you are using the spirometer at home, follow these instructions: [place] IF:   You are having difficultly using the spirometer.  You have trouble using the spirometer as often as instructed.  Your pain medication is not giving enough relief while using the spirometer.  You develop fever of 100.5 F (38.1 C) or higher. SEEK IMMEDIATE MEDICAL CARE IF:   You cough up bloody sputum that had not been present before.  You develop fever of 102 F (38.9 C) or greater.  You develop worsening pain at or near the incision site. MAKE SURE YOU:   Understand these instructions.  Will watch your condition.  Will get help right away if you are not doing well or get worse. Document Released: 05/16/2006 Document  Revised: 03/28/2011 Document Reviewed: 07/17/2006 [facility] Patient Information 2014 [place], [place].   ________________________________________________________________________

## 2019-06-13 ENCOUNTER — Other Ambulatory Visit: Payer: Self-pay

## 2019-06-13 ENCOUNTER — Encounter (HOSPITAL_COMMUNITY)
Admission: RE | Admit: 2019-06-13 | Discharge: 2019-06-13 | Disposition: A | Discharge status: Home / Self Care | Payer: Medicare HMO | Source: Ambulatory Visit | Attending: Orthopedic Surgery | Admitting: Orthopedic Surgery

## 2019-06-13 ENCOUNTER — Encounter (HOSPITAL_COMMUNITY): Payer: Self-pay

## 2019-06-13 DIAGNOSIS — Z01812 Encounter for preprocedural laboratory examination: Secondary | ICD-10-CM | POA: Insufficient documentation

## 2019-06-13 HISTORY — DX: Personal history of other diseases of the digestive system: Z87.19

## 2019-06-13 HISTORY — DX: Unspecified osteoarthritis, unspecified site: M19.90

## 2019-06-13 LAB — BASIC METABOLIC PANEL
Anion gap: 11 (ref 5–15)
BUN: 8 mg/dL (ref 8–23)
CO2: 29 mmol/L (ref 22–32)
Calcium: 9.6 mg/dL (ref 8.9–10.3)
Chloride: 102 mmol/L (ref 98–111)
Creatinine, Ser: 0.7 mg/dL (ref 0.44–1.00)
GFR calc Af Amer: >60 mL/min (ref >60)
GFR calc non Af Amer: >60 mL/min (ref >60)
Glucose, Bld: 154 mg/dL — ABNORMAL HIGH (ref 70–99)
Potassium: 3.8 mmol/L (ref 3.5–5.1)
Sodium: 142 mmol/L (ref 135–145)

## 2019-06-13 LAB — SURGICAL PCR SCREEN
MRSA, PCR: NEGATIVE
Staphylococcus aureus: NEGATIVE

## 2019-06-13 LAB — CBC
HCT: 38.7 % (ref 36.0–46.0)
Hemoglobin: 12.1 g/dL (ref 12.0–15.0)
MCH: 27.8 pg (ref 26.0–34.0)
MCHC: 31.3 g/dL (ref 30.0–36.0)
MCV: 89 fL (ref 80.0–100.0)
Platelets: 404 10*3/uL — ABNORMAL HIGH (ref 150–400)
RBC: 4.35 MIL/uL (ref 3.87–5.11)
RDW: 13.2 % (ref 11.5–15.5)
WBC: 8 10*3/uL (ref 4.0–10.5)
nRBC: 0 % (ref 0.0–0.2)

## 2019-06-13 LAB — TYPE AND SCREEN
ABO/RH(D): A POS
Antibody Screen: NEGATIVE

## 2019-06-13 NOTE — Progress Notes (Signed)
COVID Vaccine Completed:no Date COVID Vaccine completed: COVID vaccine manufacturer: [place] Cardiologist - no  Chest x-ray - 04/02/19 EKG - 04/02/19 Stress Test - no ECHO - no Cardiac Cath - no  Sleep Study - no CPAP -   Fasting Blood Sugar - NA Checks Blood Sugar _____ times a day  Blood Thinner Instructions:NA Aspirin Instructions: Last Dose:  Anesthesia review:   Patient denies shortness of breath, fever, cough and chest pain at PAT appointment  yes Patient verbalized understanding of instructions that were given to them at the PAT appointment. Patient was also instructed that they will need to review over the PAT instructions again at home before surgery. yes

## 2019-06-21 ENCOUNTER — Other Ambulatory Visit (HOSPITAL_COMMUNITY)
Admission: RE | Admit: 2019-06-21 | Discharge: 2019-06-21 | Disposition: A | Discharge status: Home / Self Care | Payer: Medicare HMO | Source: Ambulatory Visit | Attending: Orthopedic Surgery | Admitting: Orthopedic Surgery

## 2019-06-21 DIAGNOSIS — Z01812 Encounter for preprocedural laboratory examination: Secondary | ICD-10-CM | POA: Insufficient documentation

## 2019-06-21 DIAGNOSIS — Z20822 Contact with and (suspected) exposure to covid-19: Secondary | ICD-10-CM | POA: Insufficient documentation

## 2019-06-22 LAB — SARS CORONAVIRUS 2 (TAT 6-24 HRS): SARS Coronavirus 2: NEGATIVE

## 2019-06-25 ENCOUNTER — Other Ambulatory Visit: Payer: Self-pay

## 2019-06-25 ENCOUNTER — Ambulatory Visit (HOSPITAL_COMMUNITY): Payer: Medicare HMO | Admitting: Anesthesiology

## 2019-06-25 ENCOUNTER — Ambulatory Visit (HOSPITAL_COMMUNITY): Payer: Medicare HMO

## 2019-06-25 ENCOUNTER — Observation Stay (HOSPITAL_COMMUNITY): Payer: Medicare HMO

## 2019-06-25 ENCOUNTER — Encounter (HOSPITAL_COMMUNITY): Payer: Self-pay | Admitting: Orthopedic Surgery

## 2019-06-25 ENCOUNTER — Encounter (HOSPITAL_COMMUNITY)
Admission: RE | Disposition: A | Discharge status: Home / Self Care | Payer: Self-pay | Source: Ambulatory Visit | Attending: Orthopedic Surgery

## 2019-06-25 ENCOUNTER — Observation Stay (HOSPITAL_COMMUNITY)
Admission: RE | Admit: 2019-06-25 | Discharge: 2019-06-26 | Disposition: A | Discharge status: Home / Self Care | Payer: Medicare HMO | Source: Ambulatory Visit | Attending: Orthopedic Surgery | Admitting: Orthopedic Surgery

## 2019-06-25 DIAGNOSIS — M797 Fibromyalgia: Secondary | ICD-10-CM | POA: Diagnosis not present

## 2019-06-25 DIAGNOSIS — Z8249 Family history of ischemic heart disease and other diseases of the circulatory system: Secondary | ICD-10-CM | POA: Insufficient documentation

## 2019-06-25 DIAGNOSIS — Z9049 Acquired absence of other specified parts of digestive tract: Secondary | ICD-10-CM | POA: Diagnosis not present

## 2019-06-25 DIAGNOSIS — E559 Vitamin D deficiency, unspecified: Secondary | ICD-10-CM | POA: Insufficient documentation

## 2019-06-25 DIAGNOSIS — Z7982 Long term (current) use of aspirin: Secondary | ICD-10-CM | POA: Diagnosis not present

## 2019-06-25 DIAGNOSIS — Z96642 Presence of left artificial hip joint: Secondary | ICD-10-CM | POA: Diagnosis not present

## 2019-06-25 DIAGNOSIS — Z6832 Body mass index (BMI) 32.0-32.9, adult: Secondary | ICD-10-CM | POA: Diagnosis not present

## 2019-06-25 DIAGNOSIS — E669 Obesity, unspecified: Secondary | ICD-10-CM | POA: Diagnosis present

## 2019-06-25 DIAGNOSIS — I10 Essential (primary) hypertension: Secondary | ICD-10-CM | POA: Diagnosis not present

## 2019-06-25 DIAGNOSIS — K449 Diaphragmatic hernia without obstruction or gangrene: Secondary | ICD-10-CM | POA: Insufficient documentation

## 2019-06-25 DIAGNOSIS — Z96641 Presence of right artificial hip joint: Secondary | ICD-10-CM | POA: Insufficient documentation

## 2019-06-25 DIAGNOSIS — Z79899 Other long term (current) drug therapy: Secondary | ICD-10-CM | POA: Insufficient documentation

## 2019-06-25 DIAGNOSIS — E785 Hyperlipidemia, unspecified: Secondary | ICD-10-CM | POA: Diagnosis not present

## 2019-06-25 DIAGNOSIS — E663 Overweight: Secondary | ICD-10-CM | POA: Diagnosis present

## 2019-06-25 DIAGNOSIS — M1990 Unspecified osteoarthritis, unspecified site: Secondary | ICD-10-CM | POA: Diagnosis not present

## 2019-06-25 DIAGNOSIS — K219 Gastro-esophageal reflux disease without esophagitis: Secondary | ICD-10-CM | POA: Insufficient documentation

## 2019-06-25 DIAGNOSIS — Z809 Family history of malignant neoplasm, unspecified: Secondary | ICD-10-CM | POA: Insufficient documentation

## 2019-06-25 DIAGNOSIS — Z833 Family history of diabetes mellitus: Secondary | ICD-10-CM | POA: Insufficient documentation

## 2019-06-25 DIAGNOSIS — M1612 Unilateral primary osteoarthritis, left hip: Principal | ICD-10-CM | POA: Diagnosis present

## 2019-06-25 DIAGNOSIS — Z419 Encounter for procedure for purposes other than remedying health state, unspecified: Secondary | ICD-10-CM

## 2019-06-25 DIAGNOSIS — Z96649 Presence of unspecified artificial hip joint: Secondary | ICD-10-CM

## 2019-06-25 DIAGNOSIS — Z471 Aftercare following joint replacement surgery: Secondary | ICD-10-CM | POA: Diagnosis not present

## 2019-06-25 HISTORY — PX: TOTAL HIP ARTHROPLASTY: SHX124

## 2019-06-25 SURGERY — ARTHROPLASTY, HIP, TOTAL, ANTERIOR APPROACH
Anesthesia: Spinal | Site: Hip | Laterality: Left

## 2019-06-25 MED ORDER — ONDANSETRON HCL 4 MG/2ML IJ SOLN: 4.0000 mg | Freq: Once | INTRAMUSCULAR | Status: DC | PRN

## 2019-06-25 MED ORDER — POLYETHYLENE GLYCOL 3350 17 G PO PACK
17.0000 g | PACK | Freq: Two times a day (BID) | ORAL | Status: DC
  Administered 2019-06-26: 17 g via ORAL
  Filled 2019-06-25 (×2): qty 1

## 2019-06-25 MED ORDER — DEXAMETHASONE SODIUM PHOSPHATE 10 MG/ML IJ SOLN
INTRAMUSCULAR | Status: AC
  Filled 2019-06-25: qty 1

## 2019-06-25 MED ORDER — MIDAZOLAM HCL 5 MG/5ML IJ SOLN
INTRAMUSCULAR | Status: DC | PRN
  Administered 2019-06-25 (×2): 1 mg via INTRAVENOUS

## 2019-06-25 MED ORDER — ONDANSETRON HCL 4 MG/2ML IJ SOLN
INTRAMUSCULAR | Status: AC
  Filled 2019-06-25: qty 2

## 2019-06-25 MED ORDER — NON FORMULARY: 20.0000 mg | Freq: Every day | Status: DC

## 2019-06-25 MED ORDER — FENTANYL CITRATE (PF) 100 MCG/2ML IJ SOLN
INTRAMUSCULAR | Status: AC
  Filled 2019-06-25: qty 2

## 2019-06-25 MED ORDER — ACETAMINOPHEN 325 MG PO TABS: 325.0000 mg | ORAL_TABLET | Freq: Four times a day (QID) | ORAL | Status: DC | PRN

## 2019-06-25 MED ORDER — ATORVASTATIN CALCIUM 40 MG PO TABS
40.0000 mg | ORAL_TABLET | Freq: Every day | ORAL | Status: DC
  Administered 2019-06-25: 40 mg via ORAL
  Filled 2019-06-25: qty 1

## 2019-06-25 MED ORDER — FENTANYL CITRATE (PF) 100 MCG/2ML IJ SOLN
25.0000 ug | INTRAMUSCULAR | Status: DC | PRN
  Administered 2019-06-25 (×3): 50 ug via INTRAVENOUS

## 2019-06-25 MED ORDER — HYDROCODONE-ACETAMINOPHEN 5-325 MG PO TABS
1.0000 | ORAL_TABLET | ORAL | Status: DC | PRN
  Administered 2019-06-25: 1 via ORAL

## 2019-06-25 MED ORDER — BISACODYL 10 MG RE SUPP: 10.0000 mg | Freq: Every day | RECTAL | Status: DC | PRN

## 2019-06-25 MED ORDER — OXYCODONE HCL 5 MG/5ML PO SOLN: 5.0000 mg | Freq: Once | ORAL | Status: DC | PRN

## 2019-06-25 MED ORDER — MENTHOL 3 MG MT LOZG: 1.0000 | LOZENGE | OROMUCOSAL | Status: DC | PRN

## 2019-06-25 MED ORDER — TRANEXAMIC ACID-NACL 1000-0.7 MG/100ML-% IV SOLN
1000.0000 mg | Freq: Once | INTRAVENOUS | Status: AC
  Administered 2019-06-25: 1000 mg via INTRAVENOUS
  Filled 2019-06-25: qty 100

## 2019-06-25 MED ORDER — METOPROLOL SUCCINATE ER 50 MG PO TB24
50.0000 mg | ORAL_TABLET | Freq: Every day | ORAL | Status: DC
  Administered 2019-06-26: 50 mg via ORAL
  Filled 2019-06-25: qty 1

## 2019-06-25 MED ORDER — LACTATED RINGERS IV SOLN
INTRAVENOUS | Status: DC | PRN
Start: 2019-06-25 — End: 2019-06-25

## 2019-06-25 MED ORDER — CHLORHEXIDINE GLUCONATE 0.12 % MT SOLN
15.0000 mL | Freq: Once | OROMUCOSAL | Status: AC
  Administered 2019-06-25: 15 mL via OROMUCOSAL

## 2019-06-25 MED ORDER — PHENYLEPHRINE 40 MCG/ML (10ML) SYRINGE FOR IV PUSH (FOR BLOOD PRESSURE SUPPORT)
PREFILLED_SYRINGE | INTRAVENOUS | Status: DC | PRN
  Administered 2019-06-25 (×4): 80 ug via INTRAVENOUS
  Administered 2019-06-25: 120 ug via INTRAVENOUS
  Administered 2019-06-25: 80 ug via INTRAVENOUS

## 2019-06-25 MED ORDER — ASPIRIN 81 MG PO CHEW
81.0000 mg | CHEWABLE_TABLET | Freq: Two times a day (BID) | ORAL | Status: DC
  Administered 2019-06-25 – 2019-06-26 (×2): 81 mg via ORAL
  Filled 2019-06-25 (×2): qty 1

## 2019-06-25 MED ORDER — DEXAMETHASONE SODIUM PHOSPHATE 10 MG/ML IJ SOLN
10.0000 mg | Freq: Once | INTRAMUSCULAR | Status: AC
  Administered 2019-06-25: 10 mg via INTRAVENOUS

## 2019-06-25 MED ORDER — FERROUS SULFATE 325 (65 FE) MG PO TABS
325.0000 mg | ORAL_TABLET | Freq: Three times a day (TID) | ORAL | Status: DC
  Administered 2019-06-25 – 2019-06-26 (×3): 325 mg via ORAL
  Filled 2019-06-25 (×3): qty 1

## 2019-06-25 MED ORDER — METOCLOPRAMIDE HCL 5 MG/ML IJ SOLN: 5.0000 mg | Freq: Three times a day (TID) | INTRAMUSCULAR | Status: DC | PRN

## 2019-06-25 MED ORDER — CEFAZOLIN SODIUM-DEXTROSE 2-4 GM/100ML-% IV SOLN
2.0000 g | INTRAVENOUS | Status: AC
  Administered 2019-06-25: 2 g via INTRAVENOUS
  Filled 2019-06-25: qty 100

## 2019-06-25 MED ORDER — PHENYLEPHRINE 40 MCG/ML (10ML) SYRINGE FOR IV PUSH (FOR BLOOD PRESSURE SUPPORT)
PREFILLED_SYRINGE | INTRAVENOUS | Status: AC
  Filled 2019-06-25: qty 10

## 2019-06-25 MED ORDER — HYDROMORPHONE HCL 1 MG/ML IJ SOLN
0.5000 mg | INTRAMUSCULAR | Status: DC | PRN
  Administered 2019-06-25: 0.5 mg via INTRAVENOUS
  Filled 2019-06-25: qty 1

## 2019-06-25 MED ORDER — BUPIVACAINE IN DEXTROSE 0.75-8.25 % IT SOLN
INTRATHECAL | Status: DC | PRN
  Administered 2019-06-25: 1.4 mL via INTRATHECAL

## 2019-06-25 MED ORDER — HYDROCODONE-ACETAMINOPHEN 7.5-325 MG PO TABS
1.0000 | ORAL_TABLET | ORAL | Status: DC | PRN
  Administered 2019-06-25 – 2019-06-26 (×4): 2 via ORAL
  Filled 2019-06-25: qty 1
  Filled 2019-06-25 (×3): qty 2
  Filled 2019-06-25: qty 1

## 2019-06-25 MED ORDER — ONDANSETRON HCL 4 MG PO TABS: 4.0000 mg | ORAL_TABLET | Freq: Four times a day (QID) | ORAL | Status: DC | PRN

## 2019-06-25 MED ORDER — METHOCARBAMOL 500 MG IVPB - SIMPLE MED
500.0000 mg | Freq: Four times a day (QID) | INTRAVENOUS | Status: DC | PRN
  Administered 2019-06-25: 500 mg via INTRAVENOUS
  Filled 2019-06-25: qty 50

## 2019-06-25 MED ORDER — PHENOL 1.4 % MT LIQD: 1.0000 | OROMUCOSAL | Status: DC | PRN

## 2019-06-25 MED ORDER — DOCUSATE SODIUM 100 MG PO CAPS
100.0000 mg | ORAL_CAPSULE | Freq: Two times a day (BID) | ORAL | Status: DC
  Administered 2019-06-25 – 2019-06-26 (×2): 100 mg via ORAL
  Filled 2019-06-25 (×2): qty 1

## 2019-06-25 MED ORDER — LACTATED RINGERS IV SOLN: INTRAVENOUS | Status: DC

## 2019-06-25 MED ORDER — METHOCARBAMOL 500 MG PO TABS
500.0000 mg | ORAL_TABLET | Freq: Four times a day (QID) | ORAL | Status: DC | PRN
  Administered 2019-06-25: 500 mg via ORAL
  Filled 2019-06-25: qty 1

## 2019-06-25 MED ORDER — TRANEXAMIC ACID-NACL 1000-0.7 MG/100ML-% IV SOLN
1000.0000 mg | INTRAVENOUS | Status: AC
  Administered 2019-06-25: 1000 mg via INTRAVENOUS
  Filled 2019-06-25: qty 100

## 2019-06-25 MED ORDER — OXYCODONE HCL 5 MG PO TABS: 5.0000 mg | ORAL_TABLET | Freq: Once | ORAL | Status: DC | PRN

## 2019-06-25 MED ORDER — METOCLOPRAMIDE HCL 5 MG PO TABS: 5.0000 mg | ORAL_TABLET | Freq: Three times a day (TID) | ORAL | Status: DC | PRN

## 2019-06-25 MED ORDER — PROPOFOL 500 MG/50ML IV EMUL
INTRAVENOUS | Status: DC | PRN
  Administered 2019-06-25: 25 ug/kg/min via INTRAVENOUS

## 2019-06-25 MED ORDER — FLUOROMETHOLONE 0.1 % OP SUSP
1.0000 [drp] | Freq: Every day | OPHTHALMIC | Status: DC
  Filled 2019-06-25: qty 5

## 2019-06-25 MED ORDER — CEFAZOLIN SODIUM-DEXTROSE 2-4 GM/100ML-% IV SOLN
2.0000 g | Freq: Four times a day (QID) | INTRAVENOUS | Status: AC
  Administered 2019-06-25 (×2): 2 g via INTRAVENOUS
  Filled 2019-06-25 (×2): qty 100

## 2019-06-25 MED ORDER — MAGNESIUM CITRATE PO SOLN: 1.0000 | Freq: Once | ORAL | Status: DC | PRN

## 2019-06-25 MED ORDER — GABAPENTIN 100 MG PO CAPS
100.0000 mg | ORAL_CAPSULE | Freq: Every day | ORAL | Status: DC
  Administered 2019-06-25: 100 mg via ORAL
  Filled 2019-06-25: qty 1

## 2019-06-25 MED ORDER — ONDANSETRON HCL 4 MG/2ML IJ SOLN: 4.0000 mg | Freq: Four times a day (QID) | INTRAMUSCULAR | Status: DC | PRN

## 2019-06-25 MED ORDER — METHOCARBAMOL 500 MG IVPB - SIMPLE MED
INTRAVENOUS | Status: AC
  Administered 2019-06-25: 500 mg
  Filled 2019-06-25: qty 50

## 2019-06-25 MED ORDER — DEXAMETHASONE SODIUM PHOSPHATE 10 MG/ML IJ SOLN
10.0000 mg | Freq: Once | INTRAMUSCULAR | Status: AC
  Administered 2019-06-26: 10 mg via INTRAVENOUS
  Filled 2019-06-25: qty 1

## 2019-06-25 MED ORDER — FENTANYL CITRATE (PF) 100 MCG/2ML IJ SOLN
INTRAMUSCULAR | Status: DC | PRN
  Administered 2019-06-25 (×2): 50 ug via INTRAVENOUS

## 2019-06-25 MED ORDER — SODIUM CHLORIDE 0.9 % IV SOLN: INTRAVENOUS | Status: DC

## 2019-06-25 MED ORDER — ALUM & MAG HYDROXIDE-SIMETH 200-200-20 MG/5ML PO SUSP: 15.0000 mL | ORAL | Status: DC | PRN

## 2019-06-25 MED ORDER — PROPOFOL 1000 MG/100ML IV EMUL
INTRAVENOUS | Status: AC
  Filled 2019-06-25: qty 100

## 2019-06-25 MED ORDER — ONDANSETRON HCL 4 MG/2ML IJ SOLN
INTRAMUSCULAR | Status: DC | PRN
  Administered 2019-06-25: 4 mg via INTRAVENOUS

## 2019-06-25 MED ORDER — HYDROCHLOROTHIAZIDE 25 MG PO TABS
25.0000 mg | ORAL_TABLET | Freq: Every day | ORAL | Status: DC
  Administered 2019-06-25 – 2019-06-26 (×2): 25 mg via ORAL
  Filled 2019-06-25 (×2): qty 1

## 2019-06-25 MED ORDER — CELECOXIB 200 MG PO CAPS
200.0000 mg | ORAL_CAPSULE | Freq: Two times a day (BID) | ORAL | Status: DC
  Administered 2019-06-25 – 2019-06-26 (×2): 200 mg via ORAL
  Filled 2019-06-25 (×2): qty 1

## 2019-06-25 MED ORDER — HYDROCODONE-ACETAMINOPHEN 5-325 MG PO TABS
ORAL_TABLET | ORAL | Status: AC
  Filled 2019-06-25: qty 1

## 2019-06-25 MED ORDER — DIPHENHYDRAMINE HCL 12.5 MG/5ML PO ELIX: 12.5000 mg | ORAL_SOLUTION | ORAL | Status: DC | PRN

## 2019-06-25 MED ORDER — PROPOFOL 10 MG/ML IV BOLUS
INTRAVENOUS | Status: DC | PRN
  Administered 2019-06-25: 30 mg via INTRAVENOUS

## 2019-06-25 MED ORDER — OMEPRAZOLE 20 MG PO CPDR
20.0000 mg | DELAYED_RELEASE_CAPSULE | Freq: Every day | ORAL | Status: DC
  Administered 2019-06-26: 20 mg via ORAL
  Filled 2019-06-25: qty 1

## 2019-06-25 MED ORDER — MIDAZOLAM HCL 2 MG/2ML IJ SOLN
INTRAMUSCULAR | Status: AC
  Filled 2019-06-25: qty 2

## 2019-06-25 MED ORDER — ONDANSETRON HCL 4 MG PO TABS: 4.0000 mg | ORAL_TABLET | Freq: Three times a day (TID) | ORAL | 0 refills | Status: DC | PRN

## 2019-06-25 SURGICAL SUPPLY — 48 items
ADH SKN CLS APL DERMABOND .7 (GAUZE/BANDAGES/DRESSINGS) ×1
BAG DECANTER FOR FLEXI CONT (MISCELLANEOUS) IMPLANT
BAG SPEC THK2 15X12 ZIP CLS (MISCELLANEOUS) ×1
BAG ZIPLOCK 12X15 (MISCELLANEOUS) ×1 IMPLANT
BLADE SAG 18X100X1.27 (BLADE) ×2 IMPLANT
BLADE SURG SZ10 CARB STEEL (BLADE) ×4 IMPLANT
COVER PERINEAL POST (MISCELLANEOUS) ×2 IMPLANT
COVER SURGICAL LIGHT HANDLE (MISCELLANEOUS) ×2 IMPLANT
COVER WAND RF STERILE (DRAPES) IMPLANT
CUP ACETBLR 52 OD PINNACLE (Hips) ×1 IMPLANT
DERMABOND ADVANCED (GAUZE/BANDAGES/DRESSINGS) ×1
DERMABOND ADVANCED .7 DNX12 (GAUZE/BANDAGES/DRESSINGS) ×1 IMPLANT
DRAPE STERI IOBAN 125X83 (DRAPES) ×2 IMPLANT
DRAPE U-SHAPE 47X51 STRL (DRAPES) ×4 IMPLANT
DRESSING AQUACEL AG SP 3.5X10 (GAUZE/BANDAGES/DRESSINGS) ×1 IMPLANT
DRSG AQUACEL AG SP 3.5X10 (GAUZE/BANDAGES/DRESSINGS) ×2
DURAPREP 26ML APPLICATOR (WOUND CARE) ×2 IMPLANT
ELECT REM PT RETURN 15FT ADLT (MISCELLANEOUS) ×2 IMPLANT
ELIMINATOR HOLE APEX DEPUY (Hips) ×1 IMPLANT
GLOVE BIO SURGEON STRL SZ 6 (GLOVE) ×4 IMPLANT
GLOVE BIOGEL PI IND STRL 6.5 (GLOVE) ×1 IMPLANT
GLOVE BIOGEL PI IND STRL 7.5 (GLOVE) ×1 IMPLANT
GLOVE BIOGEL PI IND STRL 8.5 (GLOVE) ×1 IMPLANT
GLOVE BIOGEL PI INDICATOR 6.5 (GLOVE) ×1
GLOVE BIOGEL PI INDICATOR 7.5 (GLOVE) ×1
GLOVE BIOGEL PI INDICATOR 8.5 (GLOVE) ×1
GLOVE ECLIPSE 8.0 STRL XLNG CF (GLOVE) ×4 IMPLANT
GLOVE ORTHO TXT STRL SZ7.5 (GLOVE) ×4 IMPLANT
GOWN STRL REUS W/TWL LRG LVL3 (GOWN DISPOSABLE) ×4 IMPLANT
GOWN STRL REUS W/TWL XL LVL3 (GOWN DISPOSABLE) ×2 IMPLANT
HEAD CERAMIC DELTA 36 PLUS 1.5 (Hips) ×1 IMPLANT
HOLDER FOLEY CATH W/STRAP (MISCELLANEOUS) ×2 IMPLANT
KIT TURNOVER KIT A (KITS) IMPLANT
LINER NEUTRAL 52X36MM PLUS 4 (Liner) ×1 IMPLANT
PACK ANTERIOR HIP CUSTOM (KITS) ×2 IMPLANT
PENCIL SMOKE EVACUATOR (MISCELLANEOUS) IMPLANT
SCREW 6.5MMX30MM (Screw) ×1 IMPLANT
STEM FEM ACTIS HIGH SZ3 (Stem) ×1 IMPLANT
SUT MNCRL AB 4-0 PS2 18 (SUTURE) ×2 IMPLANT
SUT STRATAFIX 0 PDS 27 VIOLET (SUTURE) ×2
SUT VIC AB 1 CT1 36 (SUTURE) ×6 IMPLANT
SUT VIC AB 2-0 CT1 27 (SUTURE) ×4
SUT VIC AB 2-0 CT1 TAPERPNT 27 (SUTURE) ×2 IMPLANT
SUTURE STRATFX 0 PDS 27 VIOLET (SUTURE) ×1 IMPLANT
TRAY FOLEY MTR SLVR 14FR STAT (SET/KITS/TRAYS/PACK) ×1 IMPLANT
TRAY FOLEY MTR SLVR 16FR STAT (SET/KITS/TRAYS/PACK) IMPLANT
WATER STERILE IRR 1000ML POUR (IV SOLUTION) ×2 IMPLANT
YANKAUER SUCT BULB TIP 10FT TU (MISCELLANEOUS) IMPLANT

## 2019-06-25 NOTE — Discharge Instructions (Signed)
INSTRUCTIONS AFTER JOINT REPLACEMENT   o Remove items at home which could result in a fall. This includes throw rugs or furniture in walking pathways o ICE to the affected joint every three hours while awake for 30 minutes at a time, for at least the first 3-5 days, and then as needed for pain and swelling.  Continue to use ice for pain and swelling. You may notice swelling that will progress down to the foot and ankle.  This is normal after surgery.  Elevate your leg when you are not up walking on it.   o Continue to use the breathing machine you got in the hospital (incentive spirometer) which will help keep your temperature down.  It is common for your temperature to cycle up and down following surgery, especially at night when you are not up moving around and exerting yourself.  The breathing machine keeps your lungs expanded and your temperature down.   DIET:  As you were doing prior to hospitalization, we recommend a well-balanced diet.  DRESSING / WOUND CARE / SHOWERING  Keep the surgical dressing until follow up.  The dressing is water proof, so you can shower without any extra covering.  IF THE DRESSING FALLS OFF or the wound gets wet inside, change the dressing with sterile gauze.  Please use good hand washing techniques before changing the dressing.  Do not use any lotions or creams on the incision until instructed by your surgeon.    ACTIVITY  o Increase activity slowly as tolerated, but follow the weight bearing instructions below.   o No driving for 6 weeks or until further direction given by your physician.  You cannot drive while taking narcotics.  o No lifting or carrying greater than 10 lbs. until further directed by your surgeon. o Avoid periods of inactivity such as sitting longer than an hour when not asleep. This helps prevent blood clots.  o You may return to work once you are authorized by your doctor.     WEIGHT BEARING   Weight bearing as tolerated with assist  device ([place], cane, etc) as directed, use it as long as suggested by your surgeon or therapist, typically at least 4-6 weeks.   EXERCISES  Results after joint replacement surgery are often greatly improved when you follow the exercise, range of motion and muscle strengthening exercises prescribed by your doctor. Safety measures are also important to protect the joint from further injury. Any time any of these exercises cause you to have increased pain or swelling, decrease what you are doing until you are comfortable again and then slowly increase them. If you have problems or questions, call your caregiver or physical therapist for advice.   Rehabilitation is important following a joint replacement. After just a few days of immobilization, the muscles of the leg can become weakened and shrink (atrophy).  These exercises are designed to build up the tone and strength of the thigh and leg muscles and to improve motion. Often times heat used for twenty to thirty minutes before working out will loosen up your tissues and help with improving the range of motion but do not use heat for the first two weeks following surgery (sometimes heat can increase post-operative swelling).   These exercises can be done on a training (exercise) mat, on the floor, on a table or on a bed. Use whatever works the best and is most comfortable for you.    Use music or television while you are exercising so that   the exercises are a pleasant break in your day. This will make your life better with the exercises acting as a break in your routine that you can look forward to.   Perform all exercises about fifteen times, three times per day or as directed.  You should exercise both the operative leg and the other leg as well.  Exercises include:   . Quad Sets - Tighten up the muscle on the front of the thigh (Quad) and hold for 5-10 seconds.   . Straight Leg Raises - With your knee straight (if you were given a brace, keep it on),  lift the leg to 60 degrees, hold for 3 seconds, and slowly lower the leg.  Perform this exercise against resistance later as your leg gets stronger.  . Leg Slides: Lying on your back, slowly slide your foot toward your buttocks, bending your knee up off the floor (only go as far as is comfortable). Then slowly slide your foot back down until your leg is flat on the floor again.  . Angel Wings: Lying on your back spread your legs to the side as far apart as you can without causing discomfort.  . Hamstring Strength:  Lying on your back, push your heel against the floor with your leg straight by tightening up the muscles of your buttocks.  Repeat, but this time bend your knee to a comfortable angle, and push your heel against the floor.  You may put a pillow under the heel to make it more comfortable if necessary.   A rehabilitation program following joint replacement surgery can speed recovery and prevent re-injury in the future due to weakened muscles. Contact your doctor or a physical therapist for more information on knee rehabilitation.    CONSTIPATION  Constipation is defined medically as fewer than three stools per week and severe constipation as less than one stool per week.  Even if you have a regular bowel pattern at home, your normal regimen is likely to be disrupted due to multiple reasons following surgery.  Combination of anesthesia, postoperative narcotics, change in appetite and fluid intake all can affect your bowels.   YOU MUST use at least one of the following options; they are listed in order of increasing strength to get the job done.  They are all available over the counter, and you may need to use some, POSSIBLY even all of these options:    Drink plenty of fluids (prune juice may be helpful) and high fiber foods Colace 100 mg by mouth twice a day  Senokot for constipation as directed and as needed Dulcolax (bisacodyl), take with full glass of water  Miralax (polyethylene glycol)  once or twice a day as needed.  If you have tried all these things and are unable to have a bowel movement in the first 3-4 days after surgery call either your surgeon or your primary doctor.    If you experience loose stools or diarrhea, hold the medications until you stool forms back up.  If your symptoms do not get better within 1 week or if they get worse, check with your doctor.  If you experience "the worst abdominal pain ever" or develop nausea or vomiting, please contact the office immediately for further recommendations for treatment.   ITCHING:  If you experience itching with your medications, try taking only a single pain pill, or even half a pain pill at a time.  You can also use Benadryl over the counter for itching or also to   help with sleep.   TED HOSE STOCKINGS:  Use stockings on both legs until for at least 2 weeks or as directed by physician office. They may be removed at night for sleeping.  MEDICATIONS:  See your medication summary on the "After Visit Summary" that nursing will review with you.  You may have some home medications which will be placed on hold until you complete the course of blood thinner medication.  It is important for you to complete the blood thinner medication as prescribed.  PRECAUTIONS:  If you experience chest pain or shortness of breath - call 911 immediately for transfer to the hospital emergency department.   If you develop a fever greater that 101 F, purulent drainage from wound, increased redness or drainage from wound, foul odor from the wound/dressing, or calf pain - CONTACT YOUR SURGEON.                                                   FOLLOW-UP APPOINTMENTS:  If you do not already have a post-op appointment, please call the office for an appointment to be seen by your surgeon.  Guidelines for how soon to be seen are listed in your "After Visit Summary", but are typically between 1-4 weeks after surgery.  OTHER INSTRUCTIONS:   Knee  Replacement:  Do not place pillow under knee, focus on keeping the knee straight while resting.    DENTAL ANTIBIOTICS:  In most cases prophylactic antibiotics for Dental procdeures after total joint surgery are not necessary.  Exceptions are as follows:  1. History of prior total joint infection  2. Severely immunocompromised (Organ Transplant, cancer chemotherapy, Rheumatoid biologic meds such as Humera)  3. Poorly controlled diabetes (A1C &gt; 8.0, blood glucose over 200)  If you have one of these conditions, contact your surgeon for an antibiotic prescription, prior to your dental procedure.   MAKE SURE YOU:  . Understand these instructions.  . Get help right away if you are not doing well or get worse.    Thank you for letting us be a part of your medical care team.  It is a privilege we respect greatly.  We hope these instructions will help you stay on track for a fast and full recovery!    

## 2019-06-25 NOTE — Transfer of Care (Signed)
Immediate Anesthesia Transfer of Care Note  Patient: [name]  Procedure(s) Performed: TOTAL HIP ARTHROPLASTY ANTERIOR APPROACH (Left Hip)  Patient Location: PACU  Anesthesia Type:Spinal  Level of Consciousness: awake, alert  and oriented  Airway & Oxygen Therapy: Patient Spontanous Breathing and Patient connected to face mask oxygen  Post-op Assessment: Report given to RN and Post -op Vital signs reviewed and stable  Post vital signs: Reviewed and stable  Last Vitals:  Vitals Value Taken Time  BP 98/57 06/25/19 1307  Temp    Pulse 80 06/25/19 1307  Resp 22 06/25/19 1307  SpO2 100 % 06/25/19 1307  Vitals shown include unvalidated device data.  Last Pain:  Vitals:   06/25/19 0937  TempSrc: Oral  PainSc: 0-No pain      Patients Stated Pain Goal: 3 (44/62/86 3817)  Complications: No apparent anesthesia complications

## 2019-06-25 NOTE — Anesthesia Procedure Notes (Signed)
Date/Time: 06/25/2019 11:16 AM Performed by: Talbot Grumbling, CRNA Oxygen Delivery Method: Simple face mask

## 2019-06-25 NOTE — Evaluation (Signed)
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  Pt should progress to dc home with family assist.    Follow Up Recommendations Follow surgeon's recommendation for DC plan and follow-up therapies    Equipment Recommendations  None recommended by PT    Recommendations for Other Services       Precautions / Restrictions Precautions Precautions: Fall Restrictions Weight Bearing Restrictions: No Other Position/Activity Restrictions: WBAT      Mobility  Bed Mobility Overal bed mobility: Needs Assistance Bed Mobility: Supine to Sit     Supine to sit: Min assist     General bed mobility comments: cues for sequence and use of R LE to self assist  Transfers Overall transfer level: Needs assistance Equipment used: Rolling [place] (2 wheeled) Transfers: Sit to/from Stand Sit to Stand: Min guard         General transfer comment: cues for LE management and use of UEs to self assist  Ambulation/Gait Ambulation/Gait assistance: Min assist;Min guard Gait Distance (Feet): 100 Feet Assistive device: Rolling Nest (2 wheeled) Gait Pattern/deviations: Step-to pattern;Step-through pattern;Decreased step length - right;Decreased step length - left;Trunk flexed;Shuffle;Antalgic Gait velocity: decr   General Gait Details: cues for posture, position from RW and initial sequence  Stairs            Wheelchair Mobility    Modified Rankin (Stroke Patients Only)       Balance Overall balance assessment: Mild deficits observed, not formally tested                                           Pertinent Vitals/Pain Pain Assessment:  0-10 Pain Score: 5  Pain Location: L hip Pain Descriptors / Indicators: Aching;Sore Pain Intervention(s): Limited activity within patient's tolerance;Premedicated before session;Monitored during session;Ice applied    Home Living Family/patient expects to be discharged to:: Private residence Living Arrangements: Alone Available Help at Discharge: Family;Available 24 hours/day Type of Home: House Home Access: Stairs to enter Entrance Stairs-Rails: None Entrance Stairs-Number of Steps: 3 Home Layout: One level;Laundry or work area in [place]: Environmental consultant - 2 wheels;Cane - single point;Bedside commode      Prior Function Level of Independence: Independent               Hand Dominance   Dominant Hand: Right    Extremity/Trunk Assessment   Upper Extremity Assessment Upper Extremity Assessment: Overall WFL for tasks assessed    Lower Extremity Assessment Lower Extremity Assessment: LLE deficits/detail    Cervical / Trunk Assessment Cervical / Trunk Assessment: Normal  Communication   Communication: No difficulties  Cognition Arousal/Alertness: Awake/alert Behavior During Therapy: WFL for tasks assessed/performed Overall Cognitive Status: Within Functional Limits for tasks assessed                                        General Comments      Exercises Total Joint Exercises Ankle Circles/Pumps: AROM;Both;15 reps;Supine   Assessment/Plan    PT Assessment Patient needs  continued PT services  PT Problem List Decreased strength;Decreased range of motion;Decreased activity tolerance;Decreased balance;Decreased mobility;Decreased knowledge of use of DME;Pain       PT Treatment Interventions DME instruction;Gait training;Stair training;Functional mobility training;Therapeutic activities;Therapeutic exercise;Patient/family education    PT Goals (Current goals can be found in the Care Plan section)  Acute Rehab PT Goals Patient Stated  Goal: Regain IND PT Goal Formulation: With patient Time For Goal Achievement: 07/02/19 Potential to Achieve Goals: Good    Frequency 7X/week   Barriers to discharge        Co-evaluation               AM-PAC PT "6 Clicks" Mobility  Outcome Measure Help needed turning from your back to your side while in a flat bed without using bedrails?: A Little Help needed moving from lying on your back to sitting on the side of a flat bed without using bedrails?: A Little Help needed moving to and from a bed to a chair (including a wheelchair)?: A Little Help needed standing up from a chair using your arms (e.g., wheelchair or bedside chair)?: A Little Help needed to walk in hospital room?: A Little Help needed climbing 3-5 steps with a railing? : A Little 6 Click Score: 18    End of Session Equipment Utilized During Treatment: Gait belt Activity Tolerance: Patient tolerated treatment well Patient left: with call bell/phone within reach;in chair;with chair alarm set Nurse Communication: Mobility status PT Visit Diagnosis: Difficulty in walking, not elsewhere classified (R26.2)    Time: 7989-2119 PT Time Calculation (min) (ACUTE ONLY): 35 min   Charges:   PT Evaluation $PT Eval Low Complexity: 1 Low PT Treatments $Gait Training: 8-22 mins        [place] Pager [phone] Office [phone]   Amenda Duclos 06/25/2019, 6:44 PM

## 2019-06-25 NOTE — Anesthesia Postprocedure Evaluation (Signed)
Anesthesia Post Note  Patient: SERAI TUKES  Procedure(s) Performed: TOTAL HIP ARTHROPLASTY ANTERIOR APPROACH (Left Hip)     Patient location during evaluation: PACU Anesthesia Type: Spinal Level of consciousness: oriented and awake and alert Pain management: pain level controlled Vital Signs Assessment: post-procedure vital signs reviewed and stable Respiratory status: spontaneous breathing, respiratory function stable and patient connected to nasal cannula oxygen Cardiovascular status: blood pressure returned to baseline and stable Postop Assessment: no headache, no backache and no apparent nausea or vomiting Anesthetic complications: no    Last Vitals:  Vitals:   06/25/19 1430 06/25/19 1445  BP: 126/71 132/74  Pulse: 75 78  Resp: 11 13  Temp:    SpO2: 100% 99%                  Audry Pili

## 2019-06-25 NOTE — Interval H&P Note (Signed)
History and Physical Interval Note:  06/25/2019 10:02 AM  [name]  has presented today for surgery, with the diagnosis of Left hip osteoarthritis.  The various methods of treatment have been discussed with the patient and family. After consideration of risks, benefits and other options for treatment, the patient has consented to  Procedure(s) with comments: TOTAL HIP ARTHROPLASTY ANTERIOR APPROACH (Left) - 70 min as a surgical intervention.  The patient's history has been reviewed, patient examined, no change in status, stable for surgery.  I have reviewed the patient's chart and labs.  Questions were answered to the patient's satisfaction.     Mauri Pole

## 2019-06-25 NOTE — Op Note (Signed)
NAME:  [name] NO.: [id number]      MEDICAL RECORD NO.: [id number]      FACILITY:  [facility]      PHYSICIAN:  Mauri Pole  DATE OF BIRTH:  11/20/1950     DATE OF PROCEDURE:  06/25/2019                                 OPERATIVE REPORT         PREOPERATIVE DIAGNOSIS: Left  hip osteoarthritis.      POSTOPERATIVE DIAGNOSIS:  Left hip osteoarthritis.      PROCEDURE:  Left total hip replacement through an anterior approach   utilizing DePuy THR system, component size 52 mm pinnacle cup, a size 36+4 neutral   Altrex liner, a size 3 Hi Tri Lock stem with a 36+1.5 delta ceramic   ball.      SURGEON:  Pietro Cassis. Alvan Dame, M.D.      ASSISTANT:  Danae Orleans, PA-C     ANESTHESIA:  Spinal.      SPECIMENS:  None.      COMPLICATIONS:  None.      BLOOD LOSS:  250 cc     DRAINS:  None.      INDICATION OF THE PROCEDURE:  [name] is a 69 y.o. female who had   presented to office for evaluation of left hip pain.  Radiographs revealed   progressive degenerative changes with bone-on-bone   articulation of the  hip joint, including subchondral cystic changes and osteophytes.  The patient had painful limited range of   motion significantly affecting their overall quality of life and function.  The patient was failing to    respond to conservative measures including medications and/or injections and activity modification and at this point was ready   to proceed with more definitive measures.  Consent was obtained for   benefit of pain relief.  Specific risks of infection, DVT, component   failure, dislocation, neurovascular injury, and need for revision surgery were reviewed in the office as well discussion of   the anterior versus posterior approach were reviewed.     PROCEDURE IN DETAIL:  The patient was brought to operative theater.   Once adequate anesthesia, preoperative antibiotics, 2 gm of Ancef, 1 gm of Tranexamic Acid, and 10  mg of Decadron were administered, the patient was positioned supine on the [employer] table.  Once the patient was safely positioned with adequate padding of boney prominences we predraped out the hip, and used fluoroscopy to confirm orientation of the pelvis.      The left hip was then prepped and draped from proximal iliac crest to   mid thigh with a shower curtain technique.      Time-out was performed identifying the patient, planned procedure, and the appropriate extremity.     An incision was then made 2 cm lateral to the   anterior superior iliac spine extending over the orientation of the   tensor fascia lata muscle and sharp dissection was carried down to the   fascia of the muscle.      The fascia was then incised.  The muscle belly was identified and swept   laterally and retractor placed along the superior neck.  Following   cauterization of the circumflex vessels and removing  some pericapsular   fat, a second cobra retractor was placed on the inferior neck.  A T-capsulotomy was made along the line of the   superior neck to the trochanteric fossa, then extended proximally and   distally.  Tag sutures were placed and the retractors were then placed   intracapsular.  We then identified the trochanteric fossa and   orientation of my neck cut and then made a neck osteotomy with the femur on traction.  The femoral   head was removed without difficulty or complication.  Traction was let   off and retractors were placed posterior and anterior around the   acetabulum.      The labrum and foveal tissue were debrided.  I began reaming with a 45 mm   reamer and reamed up to 51 mm reamer with good bony bed preparation and a 52 mm  cup was chosen.  The final 52 mm Pinnacle cup was then impacted under fluoroscopy to confirm the depth of penetration and orientation with respect to   Abduction and forward flexion.  A screw was placed into the ilium followed by the hole eliminator.  The final    36+4 neutral Altrex liner was impacted with good visualized rim fit.  The cup was positioned anatomically within the acetabular portion of the pelvis.      At this point, the femur was rolled to 100 degrees.  Further capsule was   released off the inferior aspect of the femoral neck.  I then   released the superior capsule proximally.  With the leg in a neutral position the hook was placed laterally   along the femur under the vastus lateralis origin and elevated manually and then held in position using the hook attachment on the bed.  The leg was then extended and adducted with the leg rolled to 100   degrees of external rotation.  Retractors were placed along the medial calcar and posteriorly over the greater trochanter.  Once the proximal femur was fully   exposed, I used a box osteotome to set orientation.  I then began   broaching with the starting chili pepper broach and passed this by hand and then broached up to 3.  With the 3 broach in place I chose a high offset versus the standard neck and did several trial reductions.  The offset was appropriate, leg lengths   appeared to be equal best matched with the +1.5 head ball trial confirmed radiographically.   Given these findings, I went ahead and dislocated the hip, repositioned all   retractors and positioned the right hip in the extended and abducted position.  The final 3 HI Actis stem was   chosen and it was impacted down to the level of neck cut.  Based on this   and the trial reductions, a final 36+1.5 delta ceramic ball was chosen and   impacted onto a clean and dry trunnion, and the hip was reduced.  The   hip had been irrigated throughout the case again at this point.  I did   reapproximate the superior capsular leaflet to the anterior leaflet   using #1 Vicryl.  The fascia of the   tensor fascia lata muscle was then reapproximated using #1 Vicryl and #0 Stratafix sutures.  The   remaining wound was closed with 2-0 Vicryl and  running 4-0 Monocryl.   The hip was cleaned, dried, and dressed sterilely using Dermabond and   Aquacel dressing.  The patient was then  brought   to recovery room in stable condition tolerating the procedure well.    Danae Orleans, PA-C was present for the entirety of the case involved from   preoperative positioning, perioperative retractor management, general   facilitation of the case, as well as primary wound closure as assistant.            Pietro Cassis Alvan Dame, M.D.        06/25/2019 12:46 PM

## 2019-06-25 NOTE — Anesthesia Procedure Notes (Signed)
Spinal  Patient location during procedure: OR Start time: 06/25/2019 11:09 AM End time: 06/25/2019 11:15 AM Staffing Performed: anesthesiologist  Anesthesiologist: Audry Pili, MD Preanesthetic Checklist Completed: patient identified, IV checked, risks and benefits discussed, surgical consent, monitors and equipment checked, pre-op evaluation and timeout performed Spinal Block Patient position: sitting Prep: DuraPrep Patient monitoring: heart rate, cardiac monitor, continuous pulse ox and blood pressure Approach: midline Location: L3-4 Injection technique: single-shot Needle Needle type: Pencan  Needle gauge: 24 G Additional Notes Consent was obtained prior to the procedure with all questions answered and concerns addressed. Risks including, but not limited to, bleeding, infection, nerve damage, paralysis, failed block, inadequate analgesia, allergic reaction, high spinal, itching, and headache were discussed and the patient wished to proceed. Functioning IV was confirmed and monitors were applied. Sterile prep and drape, including hand hygiene, mask, and sterile gloves were used. The patient was positioned and the spine was prepped. The skin was anesthetized with lidocaine. First attempt by CRNA unsuccessful. Next attempt by Dr. Fransisco Beau successful. Free flow of clear CSF was obtained prior to injecting local anesthetic into the CSF. The spinal needle aspirated freely following injection. The needle was carefully withdrawn. The patient tolerated the procedure well.   Renold Don, MD

## 2019-06-25 NOTE — Anesthesia Preprocedure Evaluation (Addendum)
Anesthesia Evaluation  Patient identified by MRN, date of birth, ID band Patient awake    Reviewed: Allergy & Precautions, NPO status , Patient's Chart, lab work & pertinent test results, reviewed documented beta blocker date and time   History of Anesthesia Complications Negative for: history of anesthetic complications  Airway Mallampati: II  TM Distance: >3 FB Neck ROM: Full    Dental  (+) Dental Advisory Given, Teeth Intact   Pulmonary neg pulmonary ROS,    Pulmonary exam normal        Cardiovascular hypertension, Pt. on medications and Pt. on home beta blockers Normal cardiovascular exam     Neuro/Psych negative neurological ROS  negative psych ROS   GI/Hepatic Neg liver ROS, hiatal hernia, GERD  Medicated and Controlled,  Endo/Other   Obesity   Renal/GU negative Renal ROS     Musculoskeletal  (+) Arthritis , Fibromyalgia -, narcotic dependent  Abdominal   Peds  Hematology negative hematology ROS (+)   Anesthesia Other Findings Covid neg 6/4  Reproductive/Obstetrics                            Anesthesia Physical Anesthesia Plan  ASA: II  Anesthesia Plan: Spinal   Post-op Pain Management:    Induction:   PONV Risk Score and Plan: 2 and Treatment may vary due to age or medical condition and Propofol infusion  Airway Management Planned: Natural Airway and Simple Face Mask  Additional Equipment: None  Intra-op Plan:   Post-operative Plan:   Informed Consent: I have reviewed the patients History and Physical, chart, labs and discussed the procedure including the risks, benefits and alternatives for the proposed anesthesia with the patient or authorized representative who has indicated his/her understanding and acceptance.       Plan Discussed with: CRNA and Anesthesiologist  Anesthesia Plan Comments: (Labs reviewed, platelets acceptable. Discussed risks and benefits  of spinal, including spinal/epidural hematoma, infection, failed block, and PDPH. Patient expressed understanding and wished to proceed. )       Anesthesia Quick Evaluation

## 2019-06-26 ENCOUNTER — Other Ambulatory Visit: Payer: Self-pay | Admitting: Family

## 2019-06-26 ENCOUNTER — Encounter: Payer: Self-pay | Admitting: *Deleted

## 2019-06-26 DIAGNOSIS — M1612 Unilateral primary osteoarthritis, left hip: Secondary | ICD-10-CM | POA: Diagnosis not present

## 2019-06-26 DIAGNOSIS — I10 Essential (primary) hypertension: Secondary | ICD-10-CM

## 2019-06-26 LAB — BASIC METABOLIC PANEL
Anion gap: 8 (ref 5–15)
BUN: 12 mg/dL (ref 8–23)
CO2: 27 mmol/L (ref 22–32)
Calcium: 8.9 mg/dL (ref 8.9–10.3)
Chloride: 104 mmol/L (ref 98–111)
Creatinine, Ser: 0.65 mg/dL (ref 0.44–1.00)
GFR calc Af Amer: >60 mL/min (ref >60)
GFR calc non Af Amer: >60 mL/min (ref >60)
Glucose, Bld: 213 mg/dL — ABNORMAL HIGH (ref 70–99)
Potassium: 3.9 mmol/L (ref 3.5–5.1)
Sodium: 139 mmol/L (ref 135–145)

## 2019-06-26 LAB — CBC
HCT: 33.4 % — ABNORMAL LOW (ref 36.0–46.0)
Hemoglobin: 10.6 g/dL — ABNORMAL LOW (ref 12.0–15.0)
MCH: 28.3 pg (ref 26.0–34.0)
MCHC: 31.7 g/dL (ref 30.0–36.0)
MCV: 89.1 fL (ref 80.0–100.0)
Platelets: 333 10*3/uL (ref 150–400)
RBC: 3.75 MIL/uL — ABNORMAL LOW (ref 3.87–5.11)
RDW: 13.2 % (ref 11.5–15.5)
WBC: 13.8 10*3/uL — ABNORMAL HIGH (ref 4.0–10.5)
nRBC: 0 % (ref 0.0–0.2)

## 2019-06-26 MED ORDER — HYDROCODONE-ACETAMINOPHEN 5-325 MG PO TABS: 1.0000 | ORAL_TABLET | ORAL | 0 refills | Status: DC | PRN

## 2019-06-26 MED ORDER — ASPIRIN 81 MG PO CHEW: 81.0000 mg | CHEWABLE_TABLET | Freq: Two times a day (BID) | ORAL | 0 refills | Status: AC

## 2019-06-26 MED ORDER — FERROUS SULFATE 325 (65 FE) MG PO TABS: 325.0000 mg | ORAL_TABLET | Freq: Three times a day (TID) | ORAL | 0 refills | Status: DC

## 2019-06-26 MED ORDER — DOCUSATE SODIUM 100 MG PO CAPS
100.0000 mg | ORAL_CAPSULE | Freq: Two times a day (BID) | ORAL | 0 refills | Status: DC
Start: 2019-06-26 — End: 2019-07-25

## 2019-06-26 MED ORDER — POLYETHYLENE GLYCOL 3350 17 G PO PACK
17.0000 g | PACK | Freq: Two times a day (BID) | ORAL | 0 refills | Status: DC
Start: 2019-06-26 — End: 2019-07-25

## 2019-06-26 MED ORDER — METHOCARBAMOL 500 MG PO TABS: 500.0000 mg | ORAL_TABLET | Freq: Four times a day (QID) | ORAL | 0 refills | Status: DC | PRN

## 2019-06-26 NOTE — Progress Notes (Signed)
     Subjective: 1 Day Post-Op Procedure(s) (LRB): TOTAL HIP ARTHROPLASTY ANTERIOR APPROACH (Left)   Patient reports pain as mild, pain controlled with minimal medication.  No reported events throughout the night.  Dr. Alvan Dame discussed the procedure, findings and expectations moving forward.  Patient is ready be discharged home, she does well therapy.  Patient follow-up in the clinic in 2 weeks.  Patient knows to call with any questions or concerns.     Objective:   VITALS:   Vitals:   06/26/19 0228 06/26/19 0652  BP: 119/63 (!) 117/56  Pulse: 89 91  Resp: 12 16  Temp: 97.7 F (36.5 C) 97.8 F (36.6 C)  SpO2: 94% 98%    Dorsiflexion/Plantar flexion intact Incision: dressing C/D/I No cellulitis present Compartment soft  LABS Recent Labs    06/26/19 0306  HGB 10.6*  HCT 33.4*  WBC 13.8*  PLT 333    Recent Labs    06/26/19 0306  NA 139  K 3.9  BUN 12  CREATININE 0.65  GLUCOSE 213*     Assessment/Plan: 1 Day Post-Op Procedure(s) (LRB): TOTAL HIP ARTHROPLASTY ANTERIOR APPROACH (Left) Foley cath d/c'ed Advance diet Up with therapy D/C IV fluids Discharge home  Follow up in 2 weeks at [facility] Follow up with OLIN,Carli Lefevers D in 2 weeks.  Contact information:  [facility] [address] [place] [phone]    Obese (BMI 30-39.9) Estimated body mass index is 32.28 kg/m as calculated from the following:   Height as of this encounter: 5\' 4"  (1.626 m).   Weight as of this encounter: 85.3 kg. Patient also counseled that weight may inhibit the healing process Patient counseled that losing weight will help with future health issues        Danae Orleans PA-C  [facility]  [place] [address], [place], [place] [address] Phone: [phone] [email or web address] Facebook  [employer]

## 2019-06-26 NOTE — Progress Notes (Signed)
Met briefly with pt to confirm she has all needed DME.  Plan for HEP.  No TOC needs.  Denora Wysocki, LCSW

## 2019-06-26 NOTE — Progress Notes (Signed)
Physical Therapy Treatment Patient Details Name: [name] MRN: [id number] DOB: May 23, 1950 Today's Date: 06/26/2019    History of Present Illness Pt s/p L THR and with hx of R THR (04/09/19) and Fibromyalgia    PT Comments    Progressing with mobility.    Follow Up Recommendations  Follow surgeon's recommendation for DC plan and follow-up therapies     Equipment Recommendations  None recommended by PT    Recommendations for Other Services       Precautions / Restrictions Precautions Precautions: Fall Restrictions Weight Bearing Restrictions: No Other Position/Activity Restrictions: WBAT    Mobility  Bed Mobility Overal bed mobility: Needs Assistance Bed Mobility: Supine to Sit;Sit to Supine     Supine to sit: Min guard;HOB elevated Sit to supine: Min guard;HOB elevated   General bed mobility comments: increased time. cues for technique. pt used gait belt as leg lifter to get LE onto bed  Transfers Overall transfer level: Needs assistance Equipment used: Rolling [place] (2 wheeled) Transfers: Sit to/from Stand Sit to Stand: Min guard         General transfer comment: Min guard for safety. VCs hand placement  Ambulation/Gait Ambulation/Gait assistance: Min guard Gait Distance (Feet): 150 Feet Assistive device: Rolling [place] (2 wheeled) Gait Pattern/deviations: Step-to pattern;Step-through pattern;Decreased stride length     General Gait Details: Min guard for safety.   Stairs             Wheelchair Mobility    Modified Rankin (Stroke Patients Only)       Balance Overall balance assessment: Mild deficits observed, not formally tested                                          Cognition Arousal/Alertness: Awake/alert Behavior During Therapy: WFL for tasks assessed/performed Overall Cognitive Status: Within Functional Limits for tasks assessed                                        Exercises Total  Joint Exercises Ankle Circles/Pumps: AROM;Both;10 reps Quad Sets: AROM;Both;10 reps Heel Slides: AAROM;Left;10 reps Hip ABduction/ADduction: AAROM;Left;10 reps    General Comments        Pertinent Vitals/Pain Pain Assessment: 0-10 Pain Score: 5  Pain Location: L hip Pain Descriptors / Indicators: Aching;Sore Pain Intervention(s): Monitored during session;Repositioned;Ice applied    Home Living                      Prior Function            PT Goals (current goals can now be found in the care plan section) Progress towards PT goals: Progressing toward goals    Frequency    7X/week      PT Plan Current plan remains appropriate    Co-evaluation              AM-PAC PT "6 Clicks" Mobility   Outcome Measure  Help needed turning from your back to your side while in a flat bed without using bedrails?: A Little Help needed moving from lying on your back to sitting on the side of a flat bed without using bedrails?: A Little Help needed moving to and from a bed to a chair (including a wheelchair)?: A Little Help needed standing up from a  chair using your arms (e.g., wheelchair or bedside chair)?: A Little Help needed to walk in hospital room?: A Little Help needed climbing 3-5 steps with a railing? : A Little 6 Click Score: 18    End of Session Equipment Utilized During Treatment: Gait belt Activity Tolerance: Patient tolerated treatment well Patient left: in bed;with call bell/phone within reach   PT Visit Diagnosis: Other abnormalities of gait and mobility (R26.89)     Time: 9532-0233 PT Time Calculation (min) (ACUTE ONLY): 28 min  Charges:  $Gait Training: 8-22 mins $Therapeutic Exercise: 8-22 mins                         Doreatha Massed, PT Acute Rehabilitation  Office: [phone] Pager: [phone]

## 2019-06-26 NOTE — Discharge Summary (Signed)
Patient ID: [name] MRN: [id number] DOB/AGE: August 15, 1950 69 y.o.  Admit date: 06/25/2019 Discharge date: 06/26/2019  Admission Diagnoses:  Principal Problem:   Left hip OA Active Problems:   Obesity (BMI 30-39.9)   S/P hip replacement, left   Discharge Diagnoses:  Same  Past Medical History:  Diagnosis Date  . Arthritis    hands, feet, back, hips, knees  . Fibromyalgia   . GERD (gastroesophageal reflux disease)   . History of hiatal hernia   . Hyperlipidemia   . Hypertension     Surgeries: Procedure(s): TOTAL HIP ARTHROPLASTY ANTERIOR APPROACH on 06/25/2019   Consultants:   Discharged Condition: Improved  Hospital Course: [name] is an 69 y.o. female who was admitted 06/25/2019 for operative treatment ofOsteoarthritis of left hip. Patient has severe unremitting pain that affects sleep, daily activities, and work/hobbies. After pre-op clearance the patient was taken to the operating room on 06/25/2019 and underwent  Procedure(s): LEFT TOTAL HIP ARTHROPLASTY ANTERIOR APPROACH.    Patient was given perioperative antibiotics:  Anti-infectives (From admission, onward)   Start     Dose/Rate Route Frequency Ordered Stop   06/25/19 1730  ceFAZolin (ANCEF) IVPB 2g/100 mL premix     2 g 200 mL/hr over 30 Minutes Intravenous Every 6 hours 06/25/19 1729 06/26/19 0024   06/25/19 0930  ceFAZolin (ANCEF) IVPB 2g/100 mL premix     2 g 200 mL/hr over 30 Minutes Intravenous On call to O.R. 06/25/19 0916 06/25/19 1122       Patient was given sequential compression devices, early ambulation, and chemoprophylaxis to prevent DVT.  Patient benefited maximally from hospital stay and there were no complications.    Recent vital signs:  Patient Vitals for the past 24 hrs:  BP Temp Temp src Pulse Resp SpO2 Height Weight  06/26/19 0652 (!) 117/56 97.8 F (36.6 C) -- 91 16 98 % -- --  06/26/19 0228 119/63 97.7 F (36.5 C) Oral 89 12 94 % -- --  06/25/19 2142 (!) 148/66 97.7 F  (36.5 C) Oral (!) 102 16 95 % -- --  06/25/19 1918 -- -- -- -- -- -- 5\' 4"  (1.626 m) 85.3 kg  06/25/19 1800 130/69 97.6 F (36.4 C) -- 97 14 96 % -- --  06/25/19 1732 120/69 97.8 F (36.6 C) -- 95 13 96 % -- --  06/25/19 1700 124/73 -- -- 90 20 100 % -- --  06/25/19 1600 122/78 98 F (36.7 C) -- 88 16 100 % -- --  06/25/19 1500 109/62 97.7 F (36.5 C) -- 77 (!) 9 95 % -- --  06/25/19 1445 132/74 -- -- 78 13 99 % -- --  06/25/19 1430 126/71 -- -- 75 11 100 % -- --  06/25/19 1415 115/68 -- -- 72 10 100 % -- --  06/25/19 1400 (!) 110/59 -- -- 77 13 99 % -- --  06/25/19 1345 119/64 -- -- 74 12 100 % -- --  06/25/19 1330 112/66 -- -- 74 11 100 % -- --  06/25/19 1315 111/65 -- -- 76 15 99 % -- --  06/25/19 1307 (!) 98/57 (!) 97.4 F (36.3 C) -- 80 (!) 22 100 % -- --  06/25/19 0937 (!) 158/74 98.8 F (37.1 C) Oral 90 18 98 % -- --     Recent laboratory studies:  Recent Labs    06/26/19 0306  WBC 13.8*  HGB 10.6*  HCT 33.4*  PLT 333  NA 139  K 3.9  CL 104  CO2 27  BUN 12  CREATININE 0.65  GLUCOSE 213*  CALCIUM 8.9     Discharge Medications:   Allergies as of 06/26/2019   No Known Allergies     Medication List    TAKE these medications   aspirin 81 MG chewable tablet Commonly known as: Aspirin Childrens Chew 1 tablet (81 mg total) by mouth 2 (two) times daily. Take for 4 weeks, then resume regular dose.   atorvastatin 40 MG tablet Commonly known as: LIPITOR Take 1 tablet (40 mg total) by mouth daily. (Needs to be seen before next refill)   carboxymethylcellulose 0.5 % Soln Commonly known as: REFRESH PLUS Place 1 drop into both eyes 2 (two) times daily as needed.   docusate sodium 100 MG capsule Commonly known as: Colace Take 1 capsule (100 mg total) by mouth 2 (two) times daily.   ferrous sulfate 325 (65 FE) MG tablet Commonly known as: FerrouSul Take 1 tablet (325 mg total) by mouth 3 (three) times daily with meals for 14 days.   fluorometholone 0.1 %  ophthalmic suspension Commonly known as: FML Place 1 drop into both eyes daily.   gabapentin 100 MG capsule Commonly known as: NEURONTIN Take 1 capsule (100 mg total) by mouth 2 (two) times daily. What changed: when to take this   hydrochlorothiazide 25 MG tablet Commonly known as: HYDRODIURIL Take 1 tablet (25 mg total) by mouth daily. (Needs to be seen before next refill)   HYDROcodone-acetaminophen 5-325 MG tablet Commonly known as: Norco Take 1-2 tablets by mouth every 4 (four) hours as needed for moderate pain or severe pain.   methocarbamol 500 MG tablet Commonly known as: Robaxin Take 1 tablet (500 mg total) by mouth every 6 (six) hours as needed for muscle spasms.   metoprolol succinate 50 MG 24 hr tablet Commonly known as: TOPROL-XL Take 1 tablet (50 mg total) by mouth daily. (Needs to be seen before next refill)   omeprazole 20 MG capsule Commonly known as: PRILOSEC Take 20 mg by mouth daily.   ondansetron 4 MG tablet Commonly known as: ZOFRAN Take 1 tablet (4 mg total) by mouth every 8 (eight) hours as needed for nausea or vomiting.   polyethylene glycol 17 g packet Commonly known as: MIRALAX / GLYCOLAX Take 17 g by mouth 2 (two) times daily.   vitamin C 1000 MG tablet Take 1,000 mg by mouth 2 (two) times a day.   zinc gluconate 50 MG tablet Take 50 mg by mouth daily.            Discharge Care Instructions  (From admission, onward)         Start     Ordered   06/26/19 0000  Change dressing    Comments: Maintain surgical dressing until follow up in the clinic. If the edges start to pull up, may reinforce with tape. If the dressing is no longer working, may remove and cover with gauze and tape, but must keep the area dry and clean.  Call with any questions or concerns.   06/26/19 0843          Diagnostic Studies: DG Pelvis Portable  Result Date: 06/25/2019 CLINICAL DATA:  Status post left hip replacement today. EXAM: PORTABLE PELVIS 1-2 VIEWS  COMPARISON:  Intraoperative imaging today. FINDINGS: New left hip arthroplasty is in place. The device is located. No fracture. Gas in the soft tissues from surgery noted. Previous right hip replacement also noted. IMPRESSION: Status post left hip  replacement.  No acute finding Electronically Signed   By: Inge Rise M.D.   On: 06/25/2019 13:53   DG C-Arm 1-60 Min-No Report  Result Date: 06/25/2019 CLINICAL DATA:  Left anterior hip replacement EXAM: OPERATIVE LEFT HIP (WITH PELVIS IF PERFORMED) TECHNIQUE: Fluoroscopic spot image(s) were submitted for interpretation post-operatively. COMPARISON:  None. FINDINGS: Four fluoroscopic spot views obtained in the operating room. Initial images pre arthroplasty demonstrate osteoarthritis. Subsequent placement of left hip arthroplasty. Femoral head component is not present on the final image. Total fluoroscopy time 12 seconds. IMPRESSION: Procedural fluoroscopy during left hip arthroplasty. Electronically Signed   By: Keith Rake M.D.   On: 06/25/2019 15:04   DG HIP OPERATIVE UNILAT W OR W/O PELVIS LEFT  Result Date: 06/25/2019 CLINICAL DATA:  Left anterior hip replacement EXAM: OPERATIVE LEFT HIP (WITH PELVIS IF PERFORMED) TECHNIQUE: Fluoroscopic spot image(s) were submitted for interpretation post-operatively. COMPARISON:  None. FINDINGS: Four fluoroscopic spot views obtained in the operating room. Initial images pre arthroplasty demonstrate osteoarthritis. Subsequent placement of left hip arthroplasty. Femoral head component is not present on the final image. Total fluoroscopy time 12 seconds. IMPRESSION: Procedural fluoroscopy during left hip arthroplasty. Electronically Signed   By: Keith Rake M.D.   On: 06/25/2019 15:04    Disposition: Discharge disposition: 01-Home or Self Care       Discharge Instructions    Call MD / Call 911   Complete by: As directed    If you experience chest pain or shortness of breath, CALL 911 and be  transported to the hospital emergency room.  If you develope a fever above 101 F, pus (white drainage) or increased drainage or redness at the wound, or calf pain, call your surgeon's office.   Change dressing   Complete by: As directed    Maintain surgical dressing until follow up in the clinic. If the edges start to pull up, may reinforce with tape. If the dressing is no longer working, may remove and cover with gauze and tape, but must keep the area dry and clean.  Call with any questions or concerns.   Constipation Prevention   Complete by: As directed    Drink plenty of fluids.  Prune juice may be helpful.  You may use a stool softener, such as Colace (over the counter) 100 mg twice a day.  Use MiraLax (over the counter) for constipation as needed.   Diet - low sodium heart healthy   Complete by: As directed    Discharge instructions   Complete by: As directed    Maintain surgical dressing until follow up in the clinic. If the edges start to pull up, may reinforce with tape. If the dressing is no longer working, may remove and cover with gauze and tape, but must keep the area dry and clean.  Follow up in 2 weeks at [facility]. Call with any questions or concerns.   Increase activity slowly as tolerated   Complete by: As directed    Weight bearing as tolerated with assist device (Gassner, cane, etc) as directed, use it as long as suggested by your surgeon or therapist, typically at least 4-6 weeks.   TED hose   Complete by: As directed    Use stockings (TED hose) for 2 weeks on both leg(s).  You may remove them at night for sleeping.      Follow-up Information    Paralee Cancel, MD. Schedule an appointment as soon as possible for a visit in 2 weeks.  Specialty: Orthopedic Surgery Contact information: [address] [place] [address] [phone]            Signed: Lucille Passy [facility] 06/26/2019, 8:44 AM

## 2019-06-26 NOTE — Progress Notes (Signed)
Physical Therapy Treatment Patient Details Name: [name] MRN: [id number] DOB: 1950/02/24 Today's Date: 06/26/2019    History of Present Illness Pt s/p L THR and with hx of R THR (04/09/19) and Fibromyalgia    PT Comments    Progressing well with mobility. Reviewed/practiced gait and stair training. Issued HEP for pt to perform 2x/day. All education completed. Okay to d/c from PT standpoint.    Follow Up Recommendations  Follow surgeon's recommendation for DC plan and follow-up therapies     Equipment Recommendations  None recommended by PT    Recommendations for Other Services       Precautions / Restrictions Precautions Precautions: Fall Restrictions Weight Bearing Restrictions: No Other Position/Activity Restrictions: WBAT    Mobility  Bed Mobility Overal bed mobility: Needs Assistance Bed Mobility: Supine to Sit;Sit to Supine     Supine to sit: Supervision;HOB elevated    General bed mobility comments: pt used left lifter. increased time.  Transfers Overall transfer level: Needs assistance Equipment used: Rolling [place] (2 wheeled) Transfers: Sit to/from Stand Sit to Stand: Supervision         General transfer comment: Min guard for safety. VCs hand placement  Ambulation/Gait Ambulation/Gait assistance: Supervision Gait Distance (Feet): 175 Feet Assistive device: Rolling [place] (2 wheeled) Gait Pattern/deviations: Step-through pattern;Decreased stride length     General Gait Details: Min guard for safety.   Stairs Stairs: Yes Stairs assistance: Min assist Stair Management: Step to pattern;Backwards;With Funke Number of Stairs: 2 General stair comments: Assist to stabilize Carne. VCs safety, techique, sequence.   Wheelchair Mobility    Modified Rankin (Stroke Patients Only)       Balance Overall balance assessment: Mild deficits observed, not formally tested                                          Cognition  Arousal/Alertness: Awake/alert Behavior During Therapy: WFL for tasks assessed/performed Overall Cognitive Status: Within Functional Limits for tasks assessed                                        Exercises    General Comments        Pertinent Vitals/Pain Pain Assessment: 0-10 Pain Score: 5  Pain Location: L hip/thigh Pain Descriptors / Indicators: Aching;Sore Pain Intervention(s): Monitored during session;Ice applied    Home Living                      Prior Function            PT Goals (current goals can now be found in the care plan section) Progress towards PT goals: Progressing toward goals    Frequency    7X/week      PT Plan Current plan remains appropriate    Co-evaluation              AM-PAC PT "6 Clicks" Mobility   Outcome Measure  Help needed turning from your back to your side while in a flat bed without using bedrails?: A Little Help needed moving from lying on your back to sitting on the side of a flat bed without using bedrails?: A Little Help needed moving to and from a bed to a chair (including a wheelchair)?: A Little Help needed standing up from a  chair using your arms (e.g., wheelchair or bedside chair)?: A Little Help needed to walk in hospital room?: A Little Help needed climbing 3-5 steps with a railing? : A Little 6 Click Score: 18    End of Session Equipment Utilized During Treatment: Gait belt Activity Tolerance: Patient tolerated treatment well Patient left: in chair;with call bell/phone within reach   PT Visit Diagnosis: Other abnormalities of gait and mobility (R26.89)     Time: 2957-4734 PT Time Calculation (min) (ACUTE ONLY): 17 min  Charges:  $Gait Training: 8-22 mins $Therapeutic Exercise: 8-22 mins                         Doreatha Massed, PT Acute Rehabilitation  Office: [phone] Pager: [phone]

## 2019-06-26 NOTE — Progress Notes (Signed)
Patient discharged to home with family. Given all belongings, instructions, equipment. Verbalized understanding of all instructions. Escorted to pov via w/c.

## 2019-06-26 NOTE — Plan of Care (Signed)
Plan of care reviewed and discussed with the patient. 

## 2019-06-28 ENCOUNTER — Other Ambulatory Visit: Payer: Self-pay | Admitting: Family

## 2019-06-28 DIAGNOSIS — I10 Essential (primary) hypertension: Secondary | ICD-10-CM

## 2019-07-08 ENCOUNTER — Ambulatory Visit: Payer: Medicare HMO | Admitting: Family

## 2019-07-25 ENCOUNTER — Encounter: Payer: Self-pay | Admitting: Family

## 2019-07-25 ENCOUNTER — Other Ambulatory Visit: Payer: Self-pay

## 2019-07-25 ENCOUNTER — Ambulatory Visit (INDEPENDENT_AMBULATORY_CARE_PROVIDER_SITE_OTHER): Payer: Medicare HMO | Admitting: Family

## 2019-07-25 VITALS — BP 125/73 | HR 82 | Temp 97.9°F | Ht 64.0 in | Wt 189.2 lb

## 2019-07-25 DIAGNOSIS — H00014 Hordeolum externum left upper eyelid: Secondary | ICD-10-CM

## 2019-07-25 DIAGNOSIS — G8929 Other chronic pain: Secondary | ICD-10-CM

## 2019-07-25 DIAGNOSIS — E782 Mixed hyperlipidemia: Secondary | ICD-10-CM | POA: Diagnosis not present

## 2019-07-25 DIAGNOSIS — K219 Gastro-esophageal reflux disease without esophagitis: Secondary | ICD-10-CM

## 2019-07-25 DIAGNOSIS — M546 Pain in thoracic spine: Secondary | ICD-10-CM

## 2019-07-25 DIAGNOSIS — E669 Obesity, unspecified: Secondary | ICD-10-CM | POA: Diagnosis not present

## 2019-07-25 DIAGNOSIS — E559 Vitamin D deficiency, unspecified: Secondary | ICD-10-CM | POA: Diagnosis not present

## 2019-07-25 DIAGNOSIS — M1712 Unilateral primary osteoarthritis, left knee: Secondary | ICD-10-CM

## 2019-07-25 DIAGNOSIS — E8881 Metabolic syndrome: Secondary | ICD-10-CM | POA: Diagnosis not present

## 2019-07-25 DIAGNOSIS — M1612 Unilateral primary osteoarthritis, left hip: Secondary | ICD-10-CM | POA: Diagnosis not present

## 2019-07-25 DIAGNOSIS — I10 Essential (primary) hypertension: Secondary | ICD-10-CM | POA: Diagnosis not present

## 2019-07-25 DIAGNOSIS — Z96642 Presence of left artificial hip joint: Secondary | ICD-10-CM | POA: Diagnosis not present

## 2019-07-25 MED ORDER — GABAPENTIN 100 MG PO CAPS: 100.0000 mg | ORAL_CAPSULE | Freq: Two times a day (BID) | ORAL | 2 refills | Status: DC

## 2019-07-25 MED ORDER — METHOCARBAMOL 500 MG PO TABS: 500.0000 mg | ORAL_TABLET | Freq: Four times a day (QID) | ORAL | 0 refills | Status: DC | PRN

## 2019-07-25 MED ORDER — METOPROLOL SUCCINATE ER 50 MG PO TB24: 50.0000 mg | ORAL_TABLET | Freq: Every day | ORAL | 0 refills | Status: DC

## 2019-07-25 MED ORDER — BACITRACIN-POLYMYXIN B 500-10000 UNIT/GM OP OINT: 1.0000 "application " | TOPICAL_OINTMENT | Freq: Four times a day (QID) | OPHTHALMIC | 0 refills | Status: DC

## 2019-07-25 MED ORDER — OMEPRAZOLE 20 MG PO CPDR: 20.0000 mg | DELAYED_RELEASE_CAPSULE | Freq: Every day | ORAL | 3 refills | Status: DC

## 2019-07-25 MED ORDER — HYDROCHLOROTHIAZIDE 25 MG PO TABS: 25.0000 mg | ORAL_TABLET | Freq: Every day | ORAL | 0 refills | Status: DC

## 2019-07-25 MED ORDER — ATORVASTATIN CALCIUM 40 MG PO TABS: 40.0000 mg | ORAL_TABLET | Freq: Every day | ORAL | 3 refills | Status: DC

## 2019-07-25 NOTE — Patient Instructions (Signed)

## 2019-07-25 NOTE — Progress Notes (Signed)
Subjective:    Patient ID: [name], female    DOB: [birth year], 69 y.o.   MRN: [id number]  Chief Complaint  Patient presents with  . Medical Management of Chronic Issues  . Hypertension  . Stye   Pt presents to the office today for chronic follow up. PT has had bilateral hip replacements since our visit.  Hypertension This is a chronic problem. The current episode started more than 1 year ago. The problem has been resolved since onset. The problem is controlled. Associated symptoms include peripheral edema. Pertinent negatives include no malaise/fatigue or shortness of breath. Risk factors for coronary artery disease include dyslipidemia, obesity and sedentary lifestyle. The current treatment provides moderate improvement. There is no history of CAD/MI or heart failure.  Gastroesophageal Reflux She complains of belching and heartburn. This is a chronic problem. The current episode started more than 1 year ago. The problem occurs occasionally. The problem has been waxing and waning. Risk factors include obesity. She has tried a PPI for the symptoms. The treatment provided moderate relief.  Arthritis Presents for follow-up visit. She complains of pain and stiffness. The symptoms have been stable. Affected locations include the left hip and right hip. Her pain is at a severity of 5/10.  Hyperlipidemia This is a chronic problem. The current episode started more than 1 year ago. The problem is controlled. Recent lipid tests were reviewed and are normal. Pertinent negatives include no shortness of breath. Current antihyperlipidemic treatment includes statins. The current treatment provides moderate improvement of lipids. Risk factors for coronary artery disease include dyslipidemia, hypertension, a sedentary lifestyle and post-menopausal.      Review of Systems  Constitutional: Negative for malaise/fatigue.  Respiratory: Negative for shortness of breath.   Gastrointestinal: Positive for  heartburn.  Musculoskeletal: Positive for arthritis and stiffness.  All other systems reviewed and are negative.      Objective:   Physical Exam Vitals reviewed.  Constitutional:      General: She is not in acute distress.    Appearance: She is well-developed.  HENT:     Head: Normocephalic and atraumatic.     Right Ear: Tympanic membrane normal.     Left Ear: Tympanic membrane normal.  Eyes:     General:        Left eye: Hordeolum present.    Pupils: Pupils are equal, round, and reactive to light.   Neck:     Thyroid: No thyromegaly.  Cardiovascular:     Rate and Rhythm: Normal rate and regular rhythm.     Heart sounds: Normal heart sounds. No murmur heard.   Pulmonary:     Effort: Pulmonary effort is normal. No respiratory distress.     Breath sounds: Normal breath sounds. No wheezing.  Abdominal:     General: Bowel sounds are normal. There is no distension.     Palpations: Abdomen is soft.     Tenderness: There is no abdominal tenderness.  Musculoskeletal:        General: Swelling present. No tenderness.     Cervical back: Normal range of motion and neck supple.     Right lower leg: Edema (trace) present.     Left lower leg: Edema (trace) present.     Comments: Pain in left hip with rotation  Skin:    General: Skin is warm and dry.  Neurological:     Mental Status: She is alert and oriented to person, place, and time.     Cranial Nerves: No  cranial nerve deficit.     Deep Tendon Reflexes: Reflexes are normal and symmetric.  Psychiatric:        Behavior: Behavior normal.        Thought Content: Thought content normal.        Judgment: Judgment normal.       BP 125/73   Pulse 82   Temp 97.9 F (36.6 C) (Temporal)   Ht _0  (1.626 m)   Wt 189 lb 3.2 oz (85.8 kg)   BMI 32.48 kg/m      Assessment & Plan:  [name] comes in today with chief complaint of Medical Management of Chronic Issues, Hypertension, and Stye   Diagnosis and orders  addressed:  1. Essential hypertension - metoprolol succinate (TOPROL-XL) 50 MG 24 hr tablet; Take 1 tablet (50 mg total) by mouth daily. (Needs to be seen before next refill)  Dispense: 30 tablet; Refill: 0 - hydrochlorothiazide (HYDRODIURIL) 25 MG tablet; Take 1 tablet (25 mg total) by mouth daily. (Needs to be seen before next refill)  Dispense: 30 tablet; Refill: 0 - CMP14+EGFR - CBC with Differential/Platelet  2. Mixed hyperlipidemia - atorvastatin (LIPITOR) 40 MG tablet; Take 1 tablet (40 mg total) by mouth daily. (Needs to be seen before next refill)  Dispense: 90 tablet; Refill: 3 - CMP14+EGFR - CBC with Differential/Platelet - Lipid panel  3. Metabolic syndrome - atorvastatin (LIPITOR) 40 MG tablet; Take 1 tablet (40 mg total) by mouth daily. (Needs to be seen before next refill)  Dispense: 90 tablet; Refill: 3 - CMP14+EGFR - CBC with Differential/Platelet  4. Gastroesophageal reflux disease, unspecified whether esophagitis present - CMP14+EGFR - CBC with Differential/Platelet  5. Primary osteoarthritis of left hip - CMP14+EGFR - CBC with Differential/Platelet  6. Unilateral primary osteoarthritis, left knee - CMP14+EGFR - CBC with Differential/Platelet  7. Chronic bilateral thoracic back pain - CMP14+EGFR - CBC with Differential/Platelet  8. Obesity (BMI 30-39.9) - CMP14+EGFR - CBC with Differential/Platelet  9. Vitamin D deficiency - CMP14+EGFR - CBC with Differential/Platelet  10. S/P hip replacement, left - CMP14+EGFR - CBC with Differential/Platelet  11. Hordeolum externum of left upper eyelid Warm compresses  - CMP14+EGFR - CBC with Differential/Platelet - bacitracin-polymyxin b (POLYSPORIN) ophthalmic ointment; Place 1 application into the left eye 4 (four) times daily. apply to eye every 12 hours while awake  Dispense: 3.5 g; Refill: 0   Labs pending Health Maintenance reviewed Diet and exercise encouraged  Follow up plan: 6 months    Evelina Dun, FNP

## 2019-07-26 ENCOUNTER — Other Ambulatory Visit: Payer: Self-pay | Admitting: Family

## 2019-07-26 LAB — CBC WITH DIFFERENTIAL/PLATELET
Basophils Absolute: 0 10*3/uL (ref 0.0–0.2)
Basos: 0 %
EOS (ABSOLUTE): 0.2 10*3/uL (ref 0.0–0.4)
Eos: 2 %
Hematocrit: 37.1 % (ref 34.0–46.6)
Hemoglobin: 11.9 g/dL (ref 11.1–15.9)
Immature Grans (Abs): 0 10*3/uL (ref 0.0–0.1)
Immature Granulocytes: 0 %
Lymphocytes Absolute: 3.7 10*3/uL — ABNORMAL HIGH (ref 0.7–3.1)
Lymphs: 40 %
MCH: 27 pg (ref 26.6–33.0)
MCHC: 32.1 g/dL (ref 31.5–35.7)
MCV: 84 fL (ref 79–97)
Monocytes Absolute: 0.7 10*3/uL (ref 0.1–0.9)
Monocytes: 8 %
Neutrophils Absolute: 4.5 10*3/uL (ref 1.4–7.0)
Neutrophils: 50 %
Platelets: 519 10*3/uL — ABNORMAL HIGH (ref 150–450)
RBC: 4.4 x10E6/uL (ref 3.77–5.28)
RDW: 13 % (ref 11.7–15.4)
WBC: 9.1 10*3/uL (ref 3.4–10.8)

## 2019-07-26 LAB — CMP14+EGFR
ALT: 29 IU/L (ref 0–32)
AST: 18 IU/L (ref 0–40)
Albumin/Globulin Ratio: 1.8 (ref 1.2–2.2)
Albumin: 4.4 g/dL (ref 3.8–4.8)
Alkaline Phosphatase: 114 IU/L (ref 48–121)
BUN/Creatinine Ratio: 15 (ref 12–28)
BUN: 12 mg/dL (ref 8–27)
Bilirubin Total: 0.3 mg/dL (ref 0.0–1.2)
CO2: 28 mmol/L (ref 20–29)
Calcium: 10.2 mg/dL (ref 8.7–10.3)
Chloride: 98 mmol/L (ref 96–106)
Creatinine, Ser: 0.8 mg/dL (ref 0.57–1.00)
GFR calc Af Amer: 88 mL/min/{1.73_m2} (ref >59)
GFR calc non Af Amer: 76 mL/min/{1.73_m2} (ref >59)
Globulin, Total: 2.4 g/dL (ref 1.5–4.5)
Glucose: 135 mg/dL — ABNORMAL HIGH (ref 65–99)
Potassium: 4 mmol/L (ref 3.5–5.2)
Sodium: 140 mmol/L (ref 134–144)
Total Protein: 6.8 g/dL (ref 6.0–8.5)

## 2019-07-26 LAB — LIPID PANEL
Chol/HDL Ratio: 3.6 ratio (ref 0.0–4.4)
Cholesterol, Total: 155 mg/dL (ref 100–199)
HDL: 43 mg/dL (ref >39)
LDL Chol Calc (NIH): 75 mg/dL (ref 0–99)
Triglycerides: 226 mg/dL — ABNORMAL HIGH (ref 0–149)
VLDL Cholesterol Cal: 37 mg/dL (ref 5–40)

## 2019-08-08 DIAGNOSIS — Z471 Aftercare following joint replacement surgery: Secondary | ICD-10-CM | POA: Diagnosis not present

## 2019-08-08 DIAGNOSIS — Z96641 Presence of right artificial hip joint: Secondary | ICD-10-CM | POA: Diagnosis not present

## 2019-08-08 DIAGNOSIS — Z96642 Presence of left artificial hip joint: Secondary | ICD-10-CM | POA: Diagnosis not present

## 2019-08-13 ENCOUNTER — Telehealth: Payer: Self-pay | Admitting: Family

## 2019-08-20 ENCOUNTER — Ambulatory Visit (INDEPENDENT_AMBULATORY_CARE_PROVIDER_SITE_OTHER): Payer: Medicare HMO | Admitting: *Deleted

## 2019-08-20 DIAGNOSIS — Z0000 Encounter for general adult medical examination without abnormal findings: Secondary | ICD-10-CM | POA: Diagnosis not present

## 2019-08-20 DIAGNOSIS — I10 Essential (primary) hypertension: Secondary | ICD-10-CM

## 2019-08-20 MED ORDER — HYDROCHLOROTHIAZIDE 25 MG PO TABS: 25.0000 mg | ORAL_TABLET | Freq: Every day | ORAL | 1 refills | Status: DC

## 2019-08-20 MED ORDER — METOPROLOL SUCCINATE ER 50 MG PO TB24: 50.0000 mg | ORAL_TABLET | Freq: Every day | ORAL | 1 refills | Status: DC

## 2019-08-20 NOTE — Progress Notes (Addendum)
MEDICARE ANNUAL WELLNESS VISIT  08/20/2019  Telephone Visit Disclaimer This Medicare AWV was conducted by telephone due to national recommendations for restrictions regarding the COVID-19 Pandemic (e.g. social distancing).  I verified, using two identifiers, that I am speaking with [name] or their authorized healthcare agent. I discussed the limitations, risks, security, and privacy concerns of performing an evaluation and management service by telephone and the potential availability of an in-person appointment in the future. The patient expressed understanding and agreed to proceed.   Pt in Home  Provider in office at [facility]  Subjective:  [name] is a 69 y.o. female patient of Hawks, Theador Hawthorne, FNP who had a Medicare Annual Wellness Visit today via telephone. [name] is Working part time but recently had both her hips replaced so she isn't working at the moment and lives alone. she has 2 children. she reports that she is socially active and does interact with friends/family regularly. she is minimally physically active and enjoys scrapbooking.  Patient Care Team: Sharion Balloon, FNP as PCP - General (Nurse Practitioner) Mcarthur Rossetti, MD as Consulting Physician (Orthopedic Surgery) Paralee Cancel, MD as Consulting Physician (Orthopedic Surgery)  Advanced Directives 08/20/2019 06/25/2019 06/13/2019 05/06/2019 04/09/2019 04/03/2019 08/09/2018  Does Patient Have a Medical Advance Directive? No No No No No No No  Does patient want to make changes to medical advance directive? - - - - - - -  Would patient like information on creating a medical advance directive? No - Patient declined No - Patient declined No - Patient declined - No - Patient declined No - Patient declined No - Patient declined    Hospital Utilization Over the Past 12 Months: # of hospitalizations or ER visits: 0 # of surgeries: 2  Review of Systems    Patient reports that her overall health is  unchanged compared to last year. She does have some c/o continued breast pain since her breast reduction a little over a year ago. Pt will call her surgeon to set up follow up as she missed her last follow up due to having her hip replacement surgeries.  History obtained from chart review and the patient  Patient Reported Readings (BP, Pulse, CBG, Weight, etc) none  Pain Assessment Pain : 0-10 Pain Score: 4  Pain Type: Other (Comment) (had both hips replaced this year) Pain Location: Hip Pain Orientation: Other (Comment) (bilateral) Pain Descriptors / Indicators: Aching, Burning, Discomfort Pain Onset: More than a month ago Pain Frequency: Intermittent Pain Relieving Factors: tylenol, muscle relaxer, rest Effect of Pain on Daily Activities: mild  Pain Relieving Factors: tylenol, muscle relaxer, rest  Current Medications & Allergies (verified) Allergies as of 08/20/2019   No Known Allergies      Medication List        Accurate as of August 20, 2019  9:12 AM. If you have any questions, ask your nurse or doctor.          STOP taking these medications    HYDROcodone-acetaminophen 5-325 MG tablet Commonly known as: Norco       TAKE these medications    atorvastatin 40 MG tablet Commonly known as: LIPITOR Take 1 tablet (40 mg total) by mouth daily. (Needs to be seen before next refill)   bacitracin-polymyxin b ophthalmic ointment Commonly known as: POLYSPORIN Place 1 application into the left eye 4 (four) times daily. apply to eye every 12 hours while awake   carboxymethylcellulose 0.5 % Soln Commonly known as: REFRESH  PLUS Place 1 drop into both eyes 2 (two) times daily as needed.   fluorometholone 0.1 % ophthalmic suspension Commonly known as: FML Place 1 drop into both eyes daily.   gabapentin 100 MG capsule Commonly known as: NEURONTIN Take 1 capsule (100 mg total) by mouth 2 (two) times daily.   hydrochlorothiazide 25 MG tablet Commonly known as:  HYDRODIURIL Take 1 tablet (25 mg total) by mouth daily. What changed: additional instructions   methocarbamol 500 MG tablet Commonly known as: Robaxin Take 1 tablet (500 mg total) by mouth every 6 (six) hours as needed for muscle spasms.   metoprolol succinate 50 MG 24 hr tablet Commonly known as: TOPROL-XL Take 1 tablet (50 mg total) by mouth daily. What changed: additional instructions   omeprazole 20 MG capsule Commonly known as: PRILOSEC Take 1 capsule (20 mg total) by mouth daily.   vitamin C 1000 MG tablet Take 1,000 mg by mouth 2 (two) times a day.   zinc gluconate 50 MG tablet Take 50 mg by mouth daily.        History (reviewed): Past Medical History:  Diagnosis Date  . Arthritis    hands, feet, back, hips, knees  . Fibromyalgia   . GERD (gastroesophageal reflux disease)   . History of hiatal hernia   . Hyperlipidemia   . Hypertension    Past Surgical History:  Procedure Laterality Date  . BREAST REDUCTION SURGERY Bilateral 08/09/2018   Procedure: MAMMARY REDUCTION  (BREAST);  Surgeon: Wallace Going, DO;  Location: [facility];  Service: Plastics;  Laterality: Bilateral;  . CARPAL TUNNEL RELEASE Bilateral   . CHOLECYSTECTOMY    . COLONOSCOPY  2001  . JOINT REPLACEMENT    . TOTAL HIP ARTHROPLASTY Right 04/09/2019   Procedure: RIGHT TOTAL HIP ARTHROPLASTY ANTERIOR APPROACH;  Surgeon: Paralee Cancel, MD;  Location: [facility];  Service: Orthopedics;  Laterality: Right;  70 mins  . TOTAL HIP ARTHROPLASTY Left 06/25/2019   Procedure: TOTAL HIP ARTHROPLASTY ANTERIOR APPROACH;  Surgeon: Paralee Cancel, MD;  Location: [facility];  Service: Orthopedics;  Laterality: Left;  70 min  . TUBAL LIGATION    . 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    . UPPER GASTROINTESTINAL ENDOSCOPY     Family History  Problem Relation Age of Onset  . Hypertension Father   . Heart disease Father   . Cancer Father   . Hypertension Mother   . Heart disease Mother   . Diabetes Mother   .  Hypertension Brother   . Hypertension Sister   . Colon cancer Neg Hx   . Breast cancer Neg Hx    Social History   Socioeconomic History  . Marital status: Widowed    Spouse name: Not on file  . Number of children: 2  . Years of education: Not on file  . Highest education level: High school graduate  Occupational History  . Occupation: retired  Tobacco Use  . Smoking status: Never Smoker  . Smokeless tobacco: Never Used  Vaping Use  . Vaping Use: Never used  Substance and Sexual Activity  . Alcohol use: No  . Drug use: No  . Sexual activity: Not Currently  Other Topics Concern  . Not on file  Social History Narrative  . Not on file   Social Determinants of Health   Financial Resource Strain: Low Risk   . Difficulty of Paying Living Expenses: Not hard at all  Food Insecurity: No Food Insecurity  . Worried About Estate manager/land agent  of Food in the Last Year: Never true  . Ran Out of Food in the Last Year: Never true  Transportation Needs: No Transportation Needs  . Lack of Transportation (Medical): No  . Lack of Transportation (Non-Medical): No  Physical Activity: Inactive  . Days of Exercise per Week: 0 days  . Minutes of Exercise per Session: 0 min  Stress: No Stress Concern Present  . Feeling of Stress : Not at all  Social Connections: Socially Isolated  . Frequency of Communication with Friends and Family: More than three times a week  . Frequency of Social Gatherings with Friends and Family: More than three times a week  . Attends Religious Services: Never  . Active Member of Clubs or Organizations: No  . Attends Archivist Meetings: Never  . Marital Status: Widowed    Activities of Daily Living In your present state of health, do you have any difficulty performing the following activities: 08/20/2019 06/25/2019  Hearing? N N  Vision? N N  Comment wears otc reading glasses-has had an eye exam in the past year per pt -  Difficulty concentrating or making  decisions? N N  Walking or climbing stairs? Y N  Comment had bilateral hip replacement surgery this year-has to use a [place] or cane at times -  Dressing or bathing? N N  Doing errands, shopping? N -  Preparing Food and eating ? N -  Using the Toilet? N -  In the past six months, have you accidently leaked urine? Y -  Comment stress incontinence-wears panty liners at all times -  Do you have problems with loss of bowel control? N -  Managing your Medications? N -  Managing your Finances? N -  Housekeeping or managing your Housekeeping? N -  Some recent data might be hidden    Patient Education/ Literacy How often do you need to have someone help you when you read instructions, pamphlets, or other written materials from your doctor or pharmacy?: 1 - Never What is the last grade level you completed in school?: 12th Grade  Exercise Current Exercise Habits: The patient does not participate in regular exercise at present, Exercise limited by: orthopedic condition(s)  Diet Patient reports consuming 3 meals a day and 1 snack(s) a day Patient reports that her primary diet is: Regular Patient reports that she does have regular access to food.   Depression Screen PHQ 2/9 Scores 08/20/2019 07/25/2019 04/02/2019 10/16/2018 02/15/2018 01/01/2018 05/24/2017  PHQ - 2 Score 1 0 0 0 0 0 0  PHQ- 9 Score - - - - 2 - -     Fall Risk Fall Risk  08/20/2019 07/25/2019 04/02/2019 10/16/2018 02/15/2018  Falls in the past year? 0 0 0 0 0     Objective:  [name] seemed alert and oriented and she participated appropriately during our telephone visit.  Blood Pressure Weight BMI  BP Readings from Last 3 Encounters:  07/25/19 125/73  06/26/19 (!) 113/58  06/13/19 (!) 159/79   Wt Readings from Last 3 Encounters:  07/25/19 189 lb 3.2 oz (85.8 kg)  06/25/19 188 lb 0.8 oz (85.3 kg)  06/13/19 188 lb (85.3 kg)   BMI Readings from Last 1 Encounters:  07/25/19 32.48 kg/m    *Unable to obtain current vital  signs, weight, and BMI due to telephone visit type  Hearing/Vision  . Branae did not seem to have difficulty with hearing/understanding during the telephone conversation . Reports that she has had a formal  eye exam by an eye care professional within the past year . Reports that she has not had a formal hearing evaluation within the past year *Unable to fully assess hearing and vision during telephone visit type  Cognitive Function: 6CIT Screen 08/20/2019 07/30/2018  What Year? 0 points 0 points  What month? 0 points 0 points  What time? 0 points -  Count back from 20 0 points 0 points  Months in reverse 0 points 0 points  Repeat phrase 0 points 0 points  Total Score 0 -   (Normal:0-7, Significant for Dysfunction: >8)  Normal Cognitive Function Screening: Yes   Immunization & Health Maintenance Record Immunization History  Administered Date(s) Administered  . Influenza, High Dose Seasonal PF 11/10/2017, 11/16/2018  . Influenza,inj,Quad PF,6+ Mos 01/15/2015, 11/12/2015, 10/25/2016  . Influenza,inj,quad, With Preservative 11/16/2018  . Influenza-Unspecified 01/29/2014  . PPD Test 03/10/2014  . Pneumococcal Conjugate-13 11/12/2015  . Pneumococcal Polysaccharide-23 05/18/2017  . Tdap 03/10/2014  . Zoster 11/28/2012  . Zoster Recombinat (Shingrix) 05/24/2017, 08/14/2017    Health Maintenance  Topic Date Due  . COVID-19 Vaccine (1) Never done  . INFLUENZA VACCINE  08/18/2019  . MAMMOGRAM  09/30/2019  . TETANUS/TDAP  03/10/2024  . COLONOSCOPY  04/27/2024  . DEXA SCAN  Completed  . Hepatitis C Screening  Completed  . PNA vac Low Risk Adult  Completed       Assessment  This is a routine wellness examination for [name].  Health Maintenance: Due or Overdue Health Maintenance Due  Topic Date Due  . COVID-19 Vaccine (1) Never done  . INFLUENZA VACCINE  08/18/2019    [name] does not need a referral for Community Assistance: Care Management:   no Social  Work:    no Prescription Assistance:  no Nutrition/Diabetes Education:  no   Plan:  Personalized Goals Goals Addressed             This Visit's Progress   . Prevent falls         Personalized Health Maintenance & Screening Recommendations  Influenza vaccine  Lung Cancer Screening Recommended: no (Low Dose CT Chest recommended if Age 76-80 years, 30 pack-year currently smoking OR have quit w/in past 15 years) Hepatitis C Screening recommended: no HIV Screening recommended: no  Advanced Directives: Written information was not prepared per patient's request.  Referrals & Orders No orders of the defined types were placed in this encounter.   Follow-up Plan . Follow-up with Sharion Balloon, FNP as planned . Contact your surgeon that did the breast reduction for a follow up due to the continued breast pain  . Consider Flu vaccine at your next visit with your PCP . Bring your COVID vaccine card in at your next visit for our records   I have personally reviewed and noted the following in the patient's chart:   . Medical and social history . Use of alcohol, tobacco or illicit drugs  . Current medications and supplements . Functional ability and status . Nutritional status . Physical activity . Advanced directives . List of other physicians . Hospitalizations, surgeries, and ER visits in previous 12 months . Vitals . Screenings to include cognitive, depression, and falls . Referrals and appointments  In addition, I have reviewed and discussed with [name] certain preventive protocols, quality metrics, and best practice recommendations. A written personalized care plan for preventive services as well as general preventive health recommendations is available and can be mailed to the patient  at her request.      Milas Hock, LPN  01/17/8865    I have reviewed and agree with the above AWV documentation.   Evelina Dun, FNP

## 2019-08-20 NOTE — Patient Instructions (Signed)
Preventive Care 68 Years and Older, Female Preventive care refers to lifestyle choices and visits with your health care provider that can promote health and wellness. This includes:  A yearly physical exam. This is also called an annual well check.  Regular dental and eye exams.  Immunizations.  Screening for certain conditions.  Healthy lifestyle choices, such as diet and exercise. What can I expect for my preventive care visit? Physical exam Your health care provider will check:  Height and weight. These may be used to calculate body mass index (BMI), which is a measurement that tells if you are at a healthy weight.  Heart rate and blood pressure.  Your skin for abnormal spots. Counseling Your health care provider may ask you questions about:  Alcohol, tobacco, and drug use.  Emotional well-being.  Home and relationship well-being.  Sexual activity.  Eating habits.  History of falls.  Memory and ability to understand (cognition).  Work and work Statistician.  Pregnancy and menstrual history. What immunizations do I need?  Influenza (flu) vaccine  This is recommended every year. Tetanus, diphtheria, and pertussis (Tdap) vaccine  You may need a Td booster every 10 years. Varicella (chickenpox) vaccine  You may need this vaccine if you have not already been vaccinated. Zoster (shingles) vaccine  You may need this after age 33. Pneumococcal conjugate (PCV13) vaccine  One dose is recommended after age 33. Pneumococcal polysaccharide (PPSV23) vaccine  One dose is recommended after age 72. Measles, mumps, and rubella (MMR) vaccine  You may need at least one dose of MMR if you were born in 1957 or later. You may also need a second dose. Meningococcal conjugate (MenACWY) vaccine  You may need this if you have certain conditions. Hepatitis A vaccine  You may need this if you have certain conditions or if you travel or work in places where you may be exposed  to hepatitis A. Hepatitis B vaccine  You may need this if you have certain conditions or if you travel or work in places where you may be exposed to hepatitis B. Haemophilus influenzae type b (Hib) vaccine  You may need this if you have certain conditions. You may receive vaccines as individual doses or as more than one vaccine together in one shot (combination vaccines). Talk with your health care provider about the risks and benefits of combination vaccines. What tests do I need? Blood tests  Lipid and cholesterol levels. These may be checked every 5 years, or more frequently depending on your overall health.  Hepatitis C test.  Hepatitis B test. Screening  Lung cancer screening. You may have this screening every year starting at age 39 if you have a 30-pack-year history of smoking and currently smoke or have quit within the past 15 years.  Colorectal cancer screening. All adults should have this screening starting at age 36 and continuing until age 15. Your health care provider may recommend screening at age 23 if you are at increased risk. You will have tests every 1-10 years, depending on your results and the type of screening test.  Diabetes screening. This is done by checking your blood sugar (glucose) after you have not eaten for a while (fasting). You may have this done every 1-3 years.  Mammogram. This may be done every 1-2 years. Talk with your health care provider about how often you should have regular mammograms.  BRCA-related cancer screening. This may be done if you have a family history of breast, ovarian, tubal, or peritoneal cancers.  Other tests  Sexually transmitted disease (STD) testing.  Bone density scan. This is done to screen for osteoporosis. You may have this done starting at age 44. Follow these instructions at home: Eating and drinking  Eat a diet that includes fresh fruits and vegetables, whole grains, lean protein, and low-fat dairy products. Limit  your intake of foods with high amounts of sugar, saturated fats, and salt.  Take vitamin and mineral supplements as recommended by your health care provider.  Do not drink alcohol if your health care provider tells you not to drink.  If you drink alcohol: ? Limit how much you have to 0-1 drink a day. ? Be aware of how much alcohol is in your drink. In the [place] one drink equals one 12 oz bottle of beer (355 mL), one 5 oz glass of wine (148 mL), or one 1 oz glass of hard liquor (44 mL). Lifestyle  Take daily care of your teeth and gums.  Stay active. Exercise for at least 30 minutes on 5 or more days each week.  Do not use any products that contain nicotine or tobacco, such as cigarettes, e-cigarettes, and chewing tobacco. If you need help quitting, ask your health care provider.  If you are sexually active, practice safe sex. Use a condom or other form of protection in order to prevent STIs (sexually transmitted infections).  Talk with your health care provider about taking a low-dose aspirin or statin. What's next?  Go to your health care provider once a year for a well check visit.  Ask your health care provider how often you should have your eyes and teeth checked.  Stay up to date on all vaccines. This information is not intended to replace advice given to you by your health care provider. Make sure you discuss any questions you have with your health care provider. Document Revised: 12/28/2017 Document Reviewed: 12/28/2017 Elsevier Patient Education  2020 [employer].

## 2019-08-21 ENCOUNTER — Ambulatory Visit (HOSPITAL_COMMUNITY): Payer: Medicare HMO | Attending: Orthopedic Surgery | Admitting: Physical Therapy

## 2019-08-21 ENCOUNTER — Encounter (HOSPITAL_COMMUNITY): Payer: Self-pay | Admitting: Physical Therapy

## 2019-08-21 ENCOUNTER — Other Ambulatory Visit: Payer: Self-pay

## 2019-08-21 DIAGNOSIS — M25551 Pain in right hip: Secondary | ICD-10-CM | POA: Diagnosis not present

## 2019-08-21 DIAGNOSIS — M25552 Pain in left hip: Secondary | ICD-10-CM

## 2019-08-21 DIAGNOSIS — M6281 Muscle weakness (generalized): Secondary | ICD-10-CM

## 2019-08-21 NOTE — Therapy (Signed)
[facility] [place] [place], [place], [address] Phone: [phone]   Fax:  [phone]  Physical Therapy Evaluation  Patient Details  Name: [name] MRN: [id number] Date of Birth: 1950-06-30 Referring Provider (PT): Marissa Nestle    Encounter Date: 08/21/2019   PT End of Session - 08/21/19 1612    Visit Number 1    Number of Visits 8    Date for PT Re-Evaluation 09/20/19    Authorization Type [employer]    Progress Note Due on Visit 8    PT Start Time 1530    PT Stop Time 1610    PT Time Calculation (min) 40 min    Activity Tolerance Patient tolerated treatment well    Behavior During Therapy [facility] for tasks assessed/performed           Past Medical History:  Diagnosis Date  . Arthritis    hands, feet, back, hips, knees  . Fibromyalgia   . GERD (gastroesophageal reflux disease)   . History of hiatal hernia   . Hyperlipidemia   . Hypertension     Past Surgical History:  Procedure Laterality Date  . BREAST REDUCTION SURGERY Bilateral 08/09/2018   Procedure: MAMMARY REDUCTION  (BREAST);  Surgeon: Wallace Going, DO;  Location: [facility];  Service: Plastics;  Laterality: Bilateral;  . CARPAL TUNNEL RELEASE Bilateral   . CHOLECYSTECTOMY    . COLONOSCOPY  2001  . JOINT REPLACEMENT    . TOTAL HIP ARTHROPLASTY Right 04/09/2019   Procedure: RIGHT TOTAL HIP ARTHROPLASTY ANTERIOR APPROACH;  Surgeon: Paralee Cancel, MD;  Location: [facility];  Service: Orthopedics;  Laterality: Right;  70 mins  . TOTAL HIP ARTHROPLASTY Left 06/25/2019   Procedure: TOTAL HIP ARTHROPLASTY ANTERIOR APPROACH;  Surgeon: Paralee Cancel, MD;  Location: [facility];  Service: Orthopedics;  Laterality: Left;  70 min  . TUBAL LIGATION    . 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    . UPPER GASTROINTESTINAL ENDOSCOPY      There were no vitals filed for this visit.    Subjective Assessment - 08/21/19 1545    Subjective Pt states that she had her left  hip replaced 06/25/2019; Lt replaced in March.  She states that she does not feel that functionally the surgery has really helped her she is still having difficulty lifting up her feet, going up and down steps, and walking.    Pertinent History OA; B THR , HTN    Limitations Sitting;Lifting;Standing;Walking;House hold activities    How long can you sit comfortably? 2 hr    How long can you stand comfortably? 20-30 minutes    How long can you walk comfortably? not sure    Patient Stated Goals be stronger, to go up and down steps normally, to walk longer and be able squat    Currently in Pain? Yes    Pain Score 4    highest is a 4; lowest 2   Pain Location Hip    Pain Orientation Right;Left;Lateral    Pain Descriptors / Indicators Burning    Pain Type Acute pain    Pain Onset More than a month ago    Pain Frequency Constant    Pain Relieving Factors mm relaer    Effect of Pain on Daily Activities mild              [facility] PT Assessment - 08/21/19 0001      Assessment   Medical Diagnosis B THR  Referring Provider (PT) Marissa Nestle     Onset Date/Surgical Date 06/25/19    Next MD Visit 09/28/2019    Prior Therapy [facility]      Precautions   Precautions None      Restrictions   Weight Bearing Restrictions No      Balance Screen   Has the patient fallen in the past 6 months No    Has the patient had a decrease in activity level because of a fear of falling?  Yes    Is the patient reluctant to leave their home because of a fear of falling?  No      Home Ecologist residence    Home Access Stairs to enter    Entrance Stairs-Number of Steps 3    [place] to live on main level with bedroom/bathroom      Prior Function   Level of Independence Independent    Vocation Part time employment    Vocation Requirements sit wtith the elderly       Cognition   Overall Cognitive Status Within Functional Limits for tasks  assessed      Observation/Other Assessments   Focus on Therapeutic Outcomes (FOTO)  54; 46% affected       Functional Tests   Functional tests Single leg stance;Sit to Stand      Single Leg Stance   Comments RT: 16"; Lt 24"       Sit to Stand   Comments 10x in 30 second s      ROM / Strength   AROM / PROM / Strength Strength      Strength   Strength Assessment Site Hip;Knee    Right/Left Hip Right;Left    Right Hip Flexion 3/5    Right Hip Extension 2+/5    Right Hip ABduction 3-/5    Left Hip Flexion 3-/5    Left Hip Extension 3-/5    Left Hip ABduction 3-/5    Right/Left Knee Right;Left    Right Knee Extension 5/5    Left Knee Extension 5/5                      Objective measurements completed on examination: See above findings.        Adult PT Treatment/Exercise - 08/21/19 0001      Exercises   Exercises Knee/Hip      Knee/Hip Exercises: Standing   SLS x2 B       Knee/Hip Exercises: Seated   Sit to Sand 10 reps      Knee/Hip Exercises: Supine   Bridges 10 reps    Straight Leg Raises Both;5 reps    Straight Leg Raises Limitations Lt knee has to be bent do to weakness                   PT Education - 08/21/19 1611    Education Details HEP    Person(s) Educated Patient    Methods Explanation    Comprehension Verbalized understanding;Returned demonstration            PT Short Term Goals - 08/21/19 1650      PT SHORT TERM GOAL #1   Title PT to be completing a HEP to improve her hip strength B to allow pt to go up and down steps in a reciprocal manner.    Time 2    Period Weeks    Status  New    Target Date 09/04/19      PT SHORT TERM GOAL #2   Title PT to have stretched hip flexors to allow pt to be able to stand and walk in an upright position    Time 2    Period Weeks    Status New      PT SHORT TERM GOAL #3   Title PT pain in both hips to be no greater than a 3/10 to allow pt to be able to stand for 30 mintues  without increased pain for cooking.    Time 2    Period Weeks    Status New             PT Long Term Goals - 08/21/19 1652      PT LONG TERM GOAL #1   Title Pt to be I in an advanced HEP to allow pt to be able to walk for over 40 mintues for shopping activities    Time 4    Period Weeks    Status New    Target Date 09/18/19      PT LONG TERM GOAL #2   Title PT hip strength B to be increased by one grade to allow pt to easily squat down to get pots and pans in her lower cabinets.    Time 4    Period Weeks    Status New      PT LONG TERM GOAL #3   Title PT pain in her hips to be no greater than a 1/10 to allow her to be up for 2 hours without feeling as if she needs ot sit and rest.    Time 4    Period Weeks    Status New      PT LONG TERM GOAL #4   Title Pt to be able to single leg stance on both LE for at least 30 seconds to improve balance to decrease risk of falling    Time 4    Period Weeks    Status New                  Plan - 08/21/19 1613    Clinical Impression Statement Ms. Lenahan is a 69 yo female who had her Lt THR on June 8th and her RT was completed in March.  She has not had any physical therapy except for a therapist coming out once and showing her some exercises. She is still having pain, limited ambulation and difficulty going up steps and squatting down to get items out of her lower cabinets or off the floor,  therefore she has been referred to skilled PT.  Evaluation demonstrates tight hip flexors with pt walking with a forward bend, decreased hip strength B and increased pain affecting her activity tolerance.  Ms. Drum will benefit from skilled PT to address these issues and return her to her maximal functioning level.    Personal Factors and Comorbidities Fitness    Examination-Activity Limitations Bend;Caring for Others;Carry;Dressing;Lift;Locomotion Level;Stand;Toileting;Stairs;Squat    Examination-Participation Restrictions  Cleaning;Laundry;Meal Prep;Yard Work;Shop;Occupation    Stability/Clinical Decision Making Stable/Uncomplicated    Clinical Decision Making Low    Rehab Potential Good    PT Frequency 2x / week    PT Duration 4 weeks    PT Treatment/Interventions Gait training;Stair training;Functional mobility training;Therapeutic activities;Therapeutic exercise;Balance training;Neuromuscular re-education;Patient/family education;Manual techniques    PT Next Visit Plan forward and lateral step ups, side stepping, heel raises, functional squat  PT Home Exercise Plan sit to stand, bridge, SLR    Consulted and Agree with Plan of Care Patient           Patient will benefit from skilled therapeutic intervention in order to improve the following deficits and impairments:  Decreased activity tolerance, Decreased balance, Decreased range of motion, Decreased mobility, Decreased strength, Difficulty walking, Pain, Postural dysfunction  Visit Diagnosis: Muscle weakness (generalized)  Pain in right hip  Pain in left hip     Problem List Patient Active Problem List   Diagnosis Date Noted  . Left hip OA 06/25/2019  . S/P hip replacement, left 06/25/2019  . S/P right THA, AA 04/09/2019  . Family history of melanoma 10/16/2018  . Atypical mole 10/16/2018  . Symptomatic mammary hypertrophy 06/01/2018  . Chronic bilateral thoracic back pain 06/01/2018  . Neck pain 06/01/2018  . Metabolic syndrome 07/62/2633  . Unilateral primary osteoarthritis, left knee 02/02/2016  . Unilateral primary osteoarthritis, right hip 02/02/2016  . Obesity (BMI 30-39.9) 11/12/2015  . Vitamin D deficiency 02/24/2014  . Essential hypertension 02/21/2014  . Hyperlipidemia 02/21/2014  . GERD (gastroesophageal reflux disease) 02/21/2014    Rayetta Humphrey, PT CLT [phone] 08/21/2019, 4:56 PM  [facility] [address] [place], [place], [address] Phone: [phone]   Fax:   [phone]  Name: [name] MRN: [id number] Date of Birth: 1950/07/12

## 2019-08-26 DIAGNOSIS — E039 Hypothyroidism, unspecified: Secondary | ICD-10-CM | POA: Diagnosis not present

## 2019-08-26 DIAGNOSIS — R69 Illness, unspecified: Secondary | ICD-10-CM | POA: Diagnosis not present

## 2019-08-26 DIAGNOSIS — I6782 Cerebral ischemia: Secondary | ICD-10-CM | POA: Diagnosis not present

## 2019-08-27 ENCOUNTER — Encounter (HOSPITAL_COMMUNITY): Payer: Self-pay | Admitting: Physical Therapy

## 2019-08-27 ENCOUNTER — Other Ambulatory Visit: Payer: Self-pay

## 2019-08-27 ENCOUNTER — Ambulatory Visit (HOSPITAL_COMMUNITY): Payer: Medicare HMO | Admitting: Physical Therapy

## 2019-08-27 DIAGNOSIS — M6281 Muscle weakness (generalized): Secondary | ICD-10-CM | POA: Diagnosis not present

## 2019-08-27 DIAGNOSIS — M25551 Pain in right hip: Secondary | ICD-10-CM

## 2019-08-27 DIAGNOSIS — M25552 Pain in left hip: Secondary | ICD-10-CM

## 2019-08-27 NOTE — Therapy (Signed)
[facility], [place], [address] Phone: [phone]   Fax:  [phone]  Physical Therapy Treatment  Patient Details  Name: [name] MRN: [id number] Date of Birth: [birth year] Referring Provider (PT): Marissa Nestle    Encounter Date: 08/27/2019   PT End of Session - 08/27/19 1349    Visit Number 2    Number of Visits 8    Date for PT Re-Evaluation 09/20/19    Authorization Type [employer] Med    Progress Note Due on Visit 8    PT Start Time 1350    PT Stop Time 1430    PT Time Calculation (min) 40 min    Activity Tolerance Patient tolerated treatment well    Behavior During Therapy [facility] for tasks assessed/performed           Past Medical History:  Diagnosis Date  . Arthritis    hands, feet, back, hips, knees  . Fibromyalgia   . GERD (gastroesophageal reflux disease)   . History of hiatal hernia   . Hyperlipidemia   . Hypertension     Past Surgical History:  Procedure Laterality Date  . BREAST REDUCTION SURGERY Bilateral 08/09/2018   Procedure: MAMMARY REDUCTION  (BREAST);  Surgeon: Wallace Going, DO;  Location: [facility];  Service: Plastics;  Laterality: Bilateral;  . CARPAL TUNNEL RELEASE Bilateral   . CHOLECYSTECTOMY    . COLONOSCOPY  2001  . JOINT REPLACEMENT    . TOTAL HIP ARTHROPLASTY Right 04/09/2019   Procedure: RIGHT TOTAL HIP ARTHROPLASTY ANTERIOR APPROACH;  Surgeon: Paralee Cancel, MD;  Location: [facility];  Service: Orthopedics;  Laterality: Right;  70 mins  . TOTAL HIP ARTHROPLASTY Left 06/25/2019   Procedure: TOTAL HIP ARTHROPLASTY ANTERIOR APPROACH;  Surgeon: Paralee Cancel, MD;  Location: [facility];  Service: Orthopedics;  Laterality: Left;  70 min  . TUBAL LIGATION    . 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    . UPPER GASTROINTESTINAL ENDOSCOPY      There were no vitals filed for this visit.   Subjective Assessment - 08/27/19 1349    Subjective Patient states she feels the same as  the other day. She got on the floor to do the exercises because bed was too soft and her hip popped trying to get back up.    Pertinent History OA; B THR , HTN    Limitations Sitting;Lifting;Standing;Walking;House hold activities    How long can you sit comfortably? 2 hr    How long can you stand comfortably? 20-30 minutes    How long can you walk comfortably? not sure    Patient Stated Goals be stronger, to go up and down steps normally, to walk longer and be able squat    Currently in Pain? No/denies    Pain Onset More than a month ago                             [facility] Adult PT Treatment/Exercise - 08/27/19 0001      Knee/Hip Exercises: Standing   Heel Raises 20 reps;Both    Forward Step Up 2 sets;10 reps;Hand Hold: 1;Step Height: 6";Both    Functional Squat 10 reps;2 sets    SLS 2x 30 second holds with unilateral finger support    Other Standing Knee Exercises lateral stepping 6x15 with mini squat      Knee/Hip Exercises: Supine   Bridges 2 sets;10 reps  Straight Leg Raises Both;10 reps    Straight Leg Raises Limitations with quad set                  PT Education - 08/27/19 1349    Education Details Patient educated on HEP, exercise mechanics, scar moblizations    Person(s) Educated Patient    Methods Explanation;Demonstration    Comprehension Verbalized understanding;Returned demonstration            PT Short Term Goals - 08/27/19 1426      PT SHORT TERM GOAL #1   Title PT to be completing a HEP to improve her hip strength B to allow pt to go up and down steps in a reciprocal manner.    Time 2    Period Weeks    Status On-going    Target Date 09/04/19      PT SHORT TERM GOAL #2   Title PT to have stretched hip flexors to allow pt to be able to stand and walk in an upright position    Time 2    Period Weeks    Status On-going      PT SHORT TERM GOAL #3   Title PT pain in both hips to be no greater than a 3/10 to allow pt to be able  to stand for 30 mintues without increased pain for cooking.    Time 2    Period Weeks    Status On-going             PT Long Term Goals - 08/27/19 1427      PT LONG TERM GOAL #1   Title Pt to be I in an advanced HEP to allow pt to be able to walk for over 40 mintues for shopping activities    Time 4    Period Weeks    Status On-going      PT LONG TERM GOAL #2   Title PT hip strength B to be increased by one grade to allow pt to easily squat down to get pots and pans in her lower cabinets.    Time 4    Period Weeks    Status On-going      PT LONG TERM GOAL #3   Title PT pain in her hips to be no greater than a 1/10 to allow her to be up for 2 hours without feeling as if she needs ot sit and rest.    Time 4    Period Weeks    Status On-going      PT LONG TERM GOAL #4   Title Pt to be able to single leg stance on both LE for at least 30 seconds to improve balance to decrease risk of falling    Time 4    Period Weeks    Status On-going                 Plan - 08/27/19 1349    Clinical Impression Statement Patient requires verbal cueing for mechanics of previously done exercises. She is given cueing for prior glute contraction with bridges for emphasis on glute strengthening with good carry over. Patient notes scar tissue build up around incision, patient educated on scar mobilizations for improving tissue elasticity and reducing scar formation. Patient demonstrates good squatting mechanics with UE support following cueing and prior demonstration. Patient able to complete single leg stance with cueing for glute activation and unilateral UE support to maintain position. Patient able to complete lateral stepping without  much difficulty today despite cueing for mini squat. Add band to lateral stepping next session. Patient completes 6 inch stairs with unilateral UE support. Patient will continue to benefit from physical therapy in order to reduce impairment and improve function.     Personal Factors and Comorbidities Fitness    Examination-Activity Limitations Bend;Caring for Others;Carry;Dressing;Lift;Locomotion Level;Stand;Toileting;Stairs;Squat    Examination-Participation Restrictions Cleaning;Laundry;Meal Prep;Yard Work;Shop;Occupation    Stability/Clinical Decision Making Stable/Uncomplicated    Rehab Potential Good    PT Frequency 2x / week    PT Duration 4 weeks    PT Treatment/Interventions Gait training;Stair training;Functional mobility training;Therapeutic activities;Therapeutic exercise;Balance training;Neuromuscular re-education;Patient/family education;Manual techniques    PT Next Visit Plan add lateral step ups, side stepping with band    PT Home Exercise Plan sit to stand, bridge, SLR 8/10 squat, heel raise, lateral stepping    Consulted and Agree with Plan of Care Patient           Patient will benefit from skilled therapeutic intervention in order to improve the following deficits and impairments:  Decreased activity tolerance, Decreased balance, Decreased range of motion, Decreased mobility, Decreased strength, Difficulty walking, Pain, Postural dysfunction  Visit Diagnosis: Muscle weakness (generalized)  Pain in right hip  Pain in left hip     Problem List Patient Active Problem List   Diagnosis Date Noted  . Left hip OA 06/25/2019  . S/P hip replacement, left 06/25/2019  . S/P right THA, AA 04/09/2019  . Family history of melanoma 10/16/2018  . Atypical mole 10/16/2018  . Symptomatic mammary hypertrophy 06/01/2018  . Chronic bilateral thoracic back pain 06/01/2018  . Neck pain 06/01/2018  . Metabolic syndrome 16/10/9602  . Unilateral primary osteoarthritis, left knee 02/02/2016  . Unilateral primary osteoarthritis, right hip 02/02/2016  . Obesity (BMI 30-39.9) 11/12/2015  . Vitamin D deficiency 02/24/2014  . Essential hypertension 02/21/2014  . Hyperlipidemia 02/21/2014  . GERD (gastroesophageal reflux disease) 02/21/2014     2:31 PM, 08/27/19 Mearl Latin PT, DPT Physical Therapist at [facility] [place], [place], [address] Phone: [phone]   Fax:  [phone]  Name: KADAJAH KJOS MRN: [id number] Date of Birth: 06/18/1950

## 2019-08-27 NOTE — Patient Instructions (Signed)
Access Code: [email or web address] URL: [email or web address] Date: 08/27/2019 Prepared by: Mitzi Hansen Genowefa Morga  Exercises Heel rises with counter support - 1 x daily - 7 x weekly - 1 sets - 20 reps Squat with Counter Support - 1 x daily - 7 x weekly - 2 sets - 10 reps Sidestepping - 1 x daily - 7 x weekly - 6 sets - 10 reps

## 2019-08-29 ENCOUNTER — Telehealth (HOSPITAL_COMMUNITY): Payer: Self-pay | Admitting: Physical Therapy

## 2019-08-29 ENCOUNTER — Ambulatory Visit (HOSPITAL_COMMUNITY): Payer: Medicare HMO | Admitting: Physical Therapy

## 2019-08-29 NOTE — Telephone Encounter (Signed)
Patient no show #1. Left voicemail making patient aware of missed appointment and reminded her of next appointment.   2:30 PM, 08/29/19 Mearl Latin PT, DPT Physical Therapist at [facility]

## 2019-09-03 ENCOUNTER — Telehealth (HOSPITAL_COMMUNITY): Payer: Self-pay | Admitting: Physical Therapy

## 2019-09-03 ENCOUNTER — Encounter (HOSPITAL_COMMUNITY): Payer: Medicare HMO | Admitting: Physical Therapy

## 2019-09-03 NOTE — Telephone Encounter (Signed)
pt cancelled appt for today because her car is still not fixed

## 2019-09-05 ENCOUNTER — Ambulatory Visit (HOSPITAL_COMMUNITY): Payer: Medicare HMO

## 2019-09-05 ENCOUNTER — Encounter (HOSPITAL_COMMUNITY): Payer: Self-pay

## 2019-09-05 ENCOUNTER — Other Ambulatory Visit: Payer: Self-pay

## 2019-09-05 DIAGNOSIS — M6281 Muscle weakness (generalized): Secondary | ICD-10-CM

## 2019-09-05 DIAGNOSIS — M25551 Pain in right hip: Secondary | ICD-10-CM | POA: Diagnosis not present

## 2019-09-05 DIAGNOSIS — M25552 Pain in left hip: Secondary | ICD-10-CM

## 2019-09-05 NOTE — Therapy (Signed)
[facility] [place], [place], [address] Phone: [phone]   Fax:  [phone]  Physical Therapy Treatment  Patient Details  Name: [name] MRN: [id number] Date of Birth: October 21, 1950 Referring Provider (PT): Marissa Nestle    Encounter Date: 09/05/2019   PT End of Session - 09/05/19 1438    Visit Number 3    Number of Visits 8    Date for PT Re-Evaluation 09/20/19    Authorization Type [employer]    Progress Note Due on Visit 8    PT Start Time 1308    PT Stop Time 1346    PT Time Calculation (min) 38 min    Activity Tolerance Patient tolerated treatment well    Behavior During Therapy [facility] for tasks assessed/performed           Past Medical History:  Diagnosis Date  . Arthritis    hands, feet, back, hips, knees  . Fibromyalgia   . GERD (gastroesophageal reflux disease)   . History of hiatal hernia   . Hyperlipidemia   . Hypertension     Past Surgical History:  Procedure Laterality Date  . BREAST REDUCTION SURGERY Bilateral 08/09/2018   Procedure: MAMMARY REDUCTION  (BREAST);  Surgeon: Wallace Going, DO;  Location: [place];  Service: Plastics;  Laterality: Bilateral;  . CARPAL TUNNEL RELEASE Bilateral   . CHOLECYSTECTOMY    . COLONOSCOPY  2001  . JOINT REPLACEMENT    . TOTAL HIP ARTHROPLASTY Right 04/09/2019   Procedure: RIGHT TOTAL HIP ARTHROPLASTY ANTERIOR APPROACH;  Surgeon: Paralee Cancel, MD;  Location: [facility];  Service: Orthopedics;  Laterality: Right;  70 mins  . TOTAL HIP ARTHROPLASTY Left 06/25/2019   Procedure: TOTAL HIP ARTHROPLASTY ANTERIOR APPROACH;  Surgeon: Paralee Cancel, MD;  Location: [facility];  Service: Orthopedics;  Laterality: Left;  70 min  . TUBAL LIGATION    . 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    . UPPER GASTROINTESTINAL ENDOSCOPY      There were no vitals filed for this visit.   Subjective Assessment - 09/05/19 1424    Subjective Pt stated she is feeling good today,  no reorts of pain currently.    Pertinent History OA; B THR , HTN    Patient Stated Goals be stronger, to go up and down steps normally, to walk longer and be able squat    Currently in Pain? No/denies              [facility] PT Assessment - 09/05/19 0001      Assessment   Medical Diagnosis B THR    Referring Provider (PT) Marissa Nestle     Onset Date/Surgical Date 06/25/19    Next MD Visit 09/28/2019    Prior Therapy [facility]      Precautions   Precautions None                         [facility] Adult PT Treatment/Exercise - 09/05/19 0001      Knee/Hip Exercises: Standing   Heel Raises 20 reps;Both    Heel Raises Limitations toe raises    Lateral Step Up Both;15 reps;Hand Hold: 1;Step Height: 6"    Forward Step Up Both;15 reps;Hand Hold: 1    Forward Step Up Limitations 7in step height    Functional Squat 15 reps    SLS Rt 27", Lt 30"    SLS with Vectors 3x5"    Other Standing  Knee Exercises lateral stepping 2RT with RTB with mini squat    Other Standing Knee Exercises tandem stance 2x 30"      Knee/Hip Exercises: Seated   Sit to Sand 10 reps;without UE support   eccentric control                   PT Short Term Goals - 08/27/19 1426      PT SHORT TERM GOAL #1   Title PT to be completing a HEP to improve her hip strength B to allow pt to go up and down steps in a reciprocal manner.    Time 2    Period Weeks    Status On-going    Target Date 09/04/19      PT SHORT TERM GOAL #2   Title PT to have stretched hip flexors to allow pt to be able to stand and walk in an upright position    Time 2    Period Weeks    Status On-going      PT SHORT TERM GOAL #3   Title PT pain in both hips to be no greater than a 3/10 to allow pt to be able to stand for 30 mintues without increased pain for cooking.    Time 2    Period Weeks    Status On-going             PT Long Term Goals - 08/27/19 1427      PT LONG TERM GOAL #1   Title Pt to be I in an  advanced HEP to allow pt to be able to walk for over 40 mintues for shopping activities    Time 4    Period Weeks    Status On-going      PT LONG TERM GOAL #2   Title PT hip strength B to be increased by one grade to allow pt to easily squat down to get pots and pans in her lower cabinets.    Time 4    Period Weeks    Status On-going      PT LONG TERM GOAL #3   Title PT pain in her hips to be no greater than a 1/10 to allow her to be up for 2 hours without feeling as if she needs ot sit and rest.    Time 4    Period Weeks    Status On-going      PT LONG TERM GOAL #4   Title Pt to be able to single leg stance on both LE for at least 30 seconds to improve balance to decrease risk of falling    Time 4    Period Weeks    Status On-going                 Plan - 09/05/19 1601    Clinical Impression Statement Progressed functional strengthening this session with additional step ups, resistance with sidestepping and balance activities.  Pt able to demonstrate proper mechanics following initial instructions and verbal cueing for proper form.  Pt improved SLS time without HHA, progressed to vector stance and tandem stance for balalnce training and hip strengthening.    Personal Factors and Comorbidities Fitness    Examination-Activity Limitations Bend;Caring for Others;Carry;Dressing;Lift;Locomotion Level;Stand;Toileting;Stairs;Squat    Examination-Participation Restrictions Cleaning;Laundry;Meal Prep;Yard Work;Shop;Occupation    Stability/Clinical Decision Making Stable/Uncomplicated    Clinical Decision Making Low    Rehab Potential Good    PT Frequency 2x / week    PT  Duration 4 weeks    PT Treatment/Interventions Gait training;Stair training;Functional mobility training;Therapeutic activities;Therapeutic exercise;Balance training;Neuromuscular re-education;Patient/family education;Manual techniques    PT Next Visit Plan Progress functional strengthening and balance.  Add lunges  next session.    PT Home Exercise Plan sit to stand, bridge, SLR 8/10 squat, heel raise, lateral stepping           Patient will benefit from skilled therapeutic intervention in order to improve the following deficits and impairments:  Decreased activity tolerance, Decreased balance, Decreased range of motion, Decreased mobility, Decreased strength, Difficulty walking, Pain, Postural dysfunction  Visit Diagnosis: Muscle weakness (generalized)  Pain in right hip  Pain in left hip     Problem List Patient Active Problem List   Diagnosis Date Noted  . Left hip OA 06/25/2019  . S/P hip replacement, left 06/25/2019  . S/P right THA, AA 04/09/2019  . Family history of melanoma 10/16/2018  . Atypical mole 10/16/2018  . Symptomatic mammary hypertrophy 06/01/2018  . Chronic bilateral thoracic back pain 06/01/2018  . Neck pain 06/01/2018  . Metabolic syndrome 83/41/9622  . Unilateral primary osteoarthritis, left knee 02/02/2016  . Unilateral primary osteoarthritis, right hip 02/02/2016  . Obesity (BMI 30-39.9) 11/12/2015  . Vitamin D deficiency 02/24/2014  . Essential hypertension 02/21/2014  . Hyperlipidemia 02/21/2014  . GERD (gastroesophageal reflux disease) 02/21/2014   Ihor Austin, LPTA/CLT; CBIS [phone]  Aldona Lento 09/05/2019, 4:06 PM  [facility] [address] [place], [place], [address] Phone: [phone]   Fax:  [phone]  Name: [name] MRN: [id number] Date of Birth: 1950-11-19

## 2019-09-10 ENCOUNTER — Other Ambulatory Visit: Payer: Self-pay

## 2019-09-10 ENCOUNTER — Ambulatory Visit (HOSPITAL_COMMUNITY): Payer: Medicare HMO | Admitting: Physical Therapy

## 2019-09-10 ENCOUNTER — Encounter (HOSPITAL_COMMUNITY): Payer: Self-pay | Admitting: Physical Therapy

## 2019-09-10 DIAGNOSIS — M25551 Pain in right hip: Secondary | ICD-10-CM

## 2019-09-10 DIAGNOSIS — M25552 Pain in left hip: Secondary | ICD-10-CM

## 2019-09-10 DIAGNOSIS — M6281 Muscle weakness (generalized): Secondary | ICD-10-CM

## 2019-09-10 NOTE — Therapy (Signed)
[facility] [place], [place], [address] Phone: [phone]   Fax:  [phone]  Physical Therapy Treatment  Patient Details  Name: [name] MRN: [id number] Date of Birth: June 27, 1950 Referring Provider (PT): Marissa Nestle    Encounter Date: 09/10/2019   PT End of Session - 09/10/19 1256    Visit Number 4    Number of Visits 8    Date for PT Re-Evaluation 09/20/19    Authorization Type [employer]    Progress Note Due on Visit 8    PT Start Time 1258    PT Stop Time 1338    PT Time Calculation (min) 40 min    Activity Tolerance Patient tolerated treatment well    Behavior During Therapy [facility] for tasks assessed/performed           Past Medical History:  Diagnosis Date  . Arthritis    hands, feet, back, hips, knees  . Fibromyalgia   . GERD (gastroesophageal reflux disease)   . History of hiatal hernia   . Hyperlipidemia   . Hypertension     Past Surgical History:  Procedure Laterality Date  . BREAST REDUCTION SURGERY Bilateral 08/09/2018   Procedure: MAMMARY REDUCTION  (BREAST);  Surgeon: Wallace Going, DO;  Location: [place];  Service: Plastics;  Laterality: Bilateral;  . CARPAL TUNNEL RELEASE Bilateral   . CHOLECYSTECTOMY    . COLONOSCOPY  2001  . JOINT REPLACEMENT    . TOTAL HIP ARTHROPLASTY Right 04/09/2019   Procedure: RIGHT TOTAL HIP ARTHROPLASTY ANTERIOR APPROACH;  Surgeon: Paralee Cancel, MD;  Location: [facility];  Service: Orthopedics;  Laterality: Right;  70 mins  . TOTAL HIP ARTHROPLASTY Left 06/25/2019   Procedure: TOTAL HIP ARTHROPLASTY ANTERIOR APPROACH;  Surgeon: Paralee Cancel, MD;  Location: [facility];  Service: Orthopedics;  Laterality: Left;  70 min  . TUBAL LIGATION    . 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    . UPPER GASTROINTESTINAL ENDOSCOPY      There were no vitals filed for this visit.   Subjective Assessment - 09/10/19 1255    Subjective Patient states her home exercises  are going well. No pain. She is still afraid of falling and that is why she doesn't walk.    Pertinent History OA; B THR , HTN    Patient Stated Goals be stronger, to go up and down steps normally, to walk longer and be able squat    Currently in Pain? No/denies                             [facility] Adult PT Treatment/Exercise - 09/10/19 0001      Knee/Hip Exercises: Standing   Forward Lunges Both;1 set;10 reps    Forward Lunges Limitations with bilateral UE support    Side Lunges Both;1 set;10 reps    Lateral Step Up Both;Hand Hold: 1;Step Height: 6";10 reps;2 sets    Lateral Step Up Limitations eccentric control    Forward Step Up Both;Hand Hold: 1;2 sets;10 reps;Step Height: 6"    Forward Step Up Limitations with contralateral knee drive    Functional Squat 2 sets;15 reps    SLS with Vectors 3x5"      Knee/Hip Exercises: Prone   Hip Extension 1 set;10 reps;Both                  PT Education - 09/10/19 1256    Education Details  Patient educated on HEP, exercise mechanics    Person(s) Educated Patient    Methods Explanation;Demonstration    Comprehension Verbalized understanding;Returned demonstration            PT Short Term Goals - 08/27/19 1426      PT SHORT TERM GOAL #1   Title PT to be completing a HEP to improve her hip strength B to allow pt to go up and down steps in a reciprocal manner.    Time 2    Period Weeks    Status On-going    Target Date 09/04/19      PT SHORT TERM GOAL #2   Title PT to have stretched hip flexors to allow pt to be able to stand and walk in an upright position    Time 2    Period Weeks    Status On-going      PT SHORT TERM GOAL #3   Title PT pain in both hips to be no greater than a 3/10 to allow pt to be able to stand for 30 mintues without increased pain for cooking.    Time 2    Period Weeks    Status On-going             PT Long Term Goals - 08/27/19 1427      PT LONG TERM GOAL #1   Title Pt to  be I in an advanced HEP to allow pt to be able to walk for over 40 mintues for shopping activities    Time 4    Period Weeks    Status On-going      PT LONG TERM GOAL #2   Title PT hip strength B to be increased by one grade to allow pt to easily squat down to get pots and pans in her lower cabinets.    Time 4    Period Weeks    Status On-going      PT LONG TERM GOAL #3   Title PT pain in her hips to be no greater than a 1/10 to allow her to be up for 2 hours without feeling as if she needs ot sit and rest.    Time 4    Period Weeks    Status On-going      PT LONG TERM GOAL #4   Title Pt to be able to single leg stance on both LE for at least 30 seconds to improve balance to decrease risk of falling    Time 4    Period Weeks    Status On-going                 Plan - 09/10/19 1256    Clinical Impression Statement Patient requires unilateral UE support with step up with contralateral knee drive secondary to impaired balance. She demonstrates good eccentric control with lateral step down but fatigues quickly in quads. She has c/o R knee pain with lateral step down but is able to complete. She has difficulty with mechanics of forward and lateral lunges and has mild increase in R knee symptoms. She demonstrates good squatting mechanics following initial demonstration. Patient requires min verbal cueing for posture with SLS with vectors. Patient has difficulty getting into prone positioning and stated hip extension to be very difficult. She demonstrates good mechanics and glute activation with hip extension. Patient will continue to benefit from skilled physical therapy in order to reduce impairment and improve function.    Personal Factors and Comorbidities Fitness  Examination-Activity Limitations Bend;Caring for Others;Carry;Dressing;Lift;Locomotion Level;Stand;Toileting;Stairs;Squat    Examination-Participation Restrictions Cleaning;Laundry;Meal Prep;Yard Work;Shop;Occupation     Stability/Clinical Decision Making Stable/Uncomplicated    Rehab Potential Good    PT Frequency 2x / week    PT Duration 4 weeks    PT Treatment/Interventions Gait training;Stair training;Functional mobility training;Therapeutic activities;Therapeutic exercise;Balance training;Neuromuscular re-education;Patient/family education;Manual techniques    PT Next Visit Plan Progress functional strengthening and balance.    PT Home Exercise Plan sit to stand, bridge, SLR 8/10 squat, heel raise, lateral stepping           Patient will benefit from skilled therapeutic intervention in order to improve the following deficits and impairments:  Decreased activity tolerance, Decreased balance, Decreased range of motion, Decreased mobility, Decreased strength, Difficulty walking, Pain, Postural dysfunction  Visit Diagnosis: Muscle weakness (generalized)  Pain in right hip  Pain in left hip     Problem List Patient Active Problem List   Diagnosis Date Noted  . Left hip OA 06/25/2019  . S/P hip replacement, left 06/25/2019  . S/P right THA, AA 04/09/2019  . Family history of melanoma 10/16/2018  . Atypical mole 10/16/2018  . Symptomatic mammary hypertrophy 06/01/2018  . Chronic bilateral thoracic back pain 06/01/2018  . Neck pain 06/01/2018  . Metabolic syndrome 83/66/2947  . Unilateral primary osteoarthritis, left knee 02/02/2016  . Unilateral primary osteoarthritis, right hip 02/02/2016  . Obesity (BMI 30-39.9) 11/12/2015  . Vitamin D deficiency 02/24/2014  . Essential hypertension 02/21/2014  . Hyperlipidemia 02/21/2014  . GERD (gastroesophageal reflux disease) 02/21/2014    1:41 PM, 09/10/19 Mearl Latin PT, DPT Physical Therapist at [facility] [place], [place], [address] Phone: [phone]   Fax:  [phone]  Name: [name] MRN: [id number] Date of Birth: [birth year]

## 2019-09-12 ENCOUNTER — Telehealth (HOSPITAL_COMMUNITY): Payer: Self-pay

## 2019-09-12 ENCOUNTER — Ambulatory Visit (HOSPITAL_COMMUNITY): Payer: Medicare HMO

## 2019-09-12 NOTE — Telephone Encounter (Signed)
pt cancelled the remainder of her appts and asked to be discharged

## 2019-09-17 ENCOUNTER — Encounter (HOSPITAL_COMMUNITY): Payer: Medicare HMO | Admitting: Physical Therapy

## 2019-09-19 ENCOUNTER — Encounter (HOSPITAL_COMMUNITY): Payer: Medicare HMO | Admitting: Physical Therapy

## 2019-09-25 ENCOUNTER — Other Ambulatory Visit: Payer: Self-pay | Admitting: Family

## 2019-09-25 DIAGNOSIS — E782 Mixed hyperlipidemia: Secondary | ICD-10-CM

## 2019-09-25 DIAGNOSIS — E8881 Metabolic syndrome: Secondary | ICD-10-CM

## 2019-09-27 DIAGNOSIS — M7061 Trochanteric bursitis, right hip: Secondary | ICD-10-CM | POA: Diagnosis not present

## 2019-09-27 DIAGNOSIS — Z96642 Presence of left artificial hip joint: Secondary | ICD-10-CM | POA: Diagnosis not present

## 2019-09-27 DIAGNOSIS — M25551 Pain in right hip: Secondary | ICD-10-CM | POA: Diagnosis not present

## 2019-09-27 DIAGNOSIS — Z471 Aftercare following joint replacement surgery: Secondary | ICD-10-CM | POA: Diagnosis not present

## 2019-10-09 ENCOUNTER — Encounter (HOSPITAL_COMMUNITY): Payer: Self-pay | Admitting: Physical Therapy

## 2019-10-09 NOTE — Therapy (Signed)
[facility] [place], [place], [address] Phone: [phone]   Fax:  [phone]  Patient Details  Name: [name] MRN: [id number] Date of Birth: 04-20-1950 Referring Provider:  No ref. provider found  Encounter Date: 10/09/2019   PHYSICAL THERAPY DISCHARGE SUMMARY  Visits from Start of Care: 4  Current functional level related to goals / functional outcomes: Unable to reassess as patient has not returned.   Remaining deficits: Unable to reassess as patient has not returned.   Education / Equipment: HEP  Plan: Patient agrees to discharge.  Patient goals were not met. Patient is being discharged due to not returning since the last visit.  ?????       2:24 PM, 10/09/19 Mearl Latin PT, DPT Physical Therapist at [facility] [place], [place], [address] Phone: [phone]   Fax:  [phone]

## 2019-11-10 DIAGNOSIS — R69 Illness, unspecified: Secondary | ICD-10-CM | POA: Diagnosis not present

## 2019-11-22 ENCOUNTER — Other Ambulatory Visit: Payer: Self-pay | Admitting: Family

## 2019-11-22 DIAGNOSIS — Z1231 Encounter for screening mammogram for malignant neoplasm of breast: Secondary | ICD-10-CM

## 2019-11-25 ENCOUNTER — Other Ambulatory Visit: Payer: Self-pay

## 2019-11-25 ENCOUNTER — Ambulatory Visit
Admission: RE | Admit: 2019-11-25 | Discharge: 2019-11-25 | Disposition: A | Discharge status: Home / Self Care | Payer: Medicare HMO | Source: Ambulatory Visit | Attending: Family | Admitting: Family

## 2019-11-25 DIAGNOSIS — Z1231 Encounter for screening mammogram for malignant neoplasm of breast: Secondary | ICD-10-CM | POA: Diagnosis not present

## 2019-12-09 DIAGNOSIS — L430 Hypertrophic lichen planus: Secondary | ICD-10-CM | POA: Diagnosis not present

## 2019-12-09 DIAGNOSIS — D485 Neoplasm of uncertain behavior of skin: Secondary | ICD-10-CM | POA: Diagnosis not present

## 2019-12-09 DIAGNOSIS — L820 Inflamed seborrheic keratosis: Secondary | ICD-10-CM | POA: Diagnosis not present

## 2019-12-09 DIAGNOSIS — D225 Melanocytic nevi of trunk: Secondary | ICD-10-CM | POA: Diagnosis not present

## 2019-12-09 DIAGNOSIS — L239 Allergic contact dermatitis, unspecified cause: Secondary | ICD-10-CM | POA: Diagnosis not present

## 2020-01-31 DIAGNOSIS — Z0100 Encounter for examination of eyes and vision without abnormal findings: Secondary | ICD-10-CM | POA: Diagnosis not present

## 2020-01-31 DIAGNOSIS — H5213 Myopia, bilateral: Secondary | ICD-10-CM | POA: Diagnosis not present

## 2020-02-27 ENCOUNTER — Other Ambulatory Visit: Payer: Self-pay | Admitting: Family

## 2020-02-27 DIAGNOSIS — I10 Essential (primary) hypertension: Secondary | ICD-10-CM

## 2020-03-12 ENCOUNTER — Ambulatory Visit (INDEPENDENT_AMBULATORY_CARE_PROVIDER_SITE_OTHER): Payer: Medicare HMO | Admitting: Family

## 2020-03-12 ENCOUNTER — Other Ambulatory Visit: Payer: Self-pay

## 2020-03-12 ENCOUNTER — Encounter: Payer: Self-pay | Admitting: Family

## 2020-03-12 ENCOUNTER — Ambulatory Visit (INDEPENDENT_AMBULATORY_CARE_PROVIDER_SITE_OTHER): Payer: Medicare HMO

## 2020-03-12 VITALS — BP 140/80 | HR 86 | Temp 97.3°F | Ht 64.0 in | Wt 189.0 lb

## 2020-03-12 DIAGNOSIS — I10 Essential (primary) hypertension: Secondary | ICD-10-CM

## 2020-03-12 DIAGNOSIS — K219 Gastro-esophageal reflux disease without esophagitis: Secondary | ICD-10-CM | POA: Diagnosis not present

## 2020-03-12 DIAGNOSIS — E782 Mixed hyperlipidemia: Secondary | ICD-10-CM | POA: Diagnosis not present

## 2020-03-12 DIAGNOSIS — Z780 Asymptomatic menopausal state: Secondary | ICD-10-CM

## 2020-03-12 DIAGNOSIS — R1012 Left upper quadrant pain: Secondary | ICD-10-CM | POA: Diagnosis not present

## 2020-03-12 DIAGNOSIS — E559 Vitamin D deficiency, unspecified: Secondary | ICD-10-CM

## 2020-03-12 DIAGNOSIS — E669 Obesity, unspecified: Secondary | ICD-10-CM | POA: Diagnosis not present

## 2020-03-12 MED ORDER — OMEPRAZOLE 40 MG PO CPDR: 40.0000 mg | DELAYED_RELEASE_CAPSULE | Freq: Every day | ORAL | 3 refills | Status: DC

## 2020-03-12 NOTE — Progress Notes (Signed)
Subjective:    Patient ID: [name], female    DOB: Aug 02, 1950, 70 y.o.   MRN: [id number]  Chief Complaint  Patient presents with  . Gastroesophageal Reflux  . Hypertension   Pt presents to the office today for chronic follow up. PT has had bilateral hip replacements since our visit.  Gastroesophageal Reflux She complains of abdominal pain, belching and heartburn. She reports no globus sensation. This is a chronic problem. The current episode started more than 1 year ago. The problem occurs occasionally. The problem has been waxing and waning. She has tried a PPI for the symptoms. The treatment provided moderate relief.  Hypertension This is a chronic problem. The current episode started more than 1 year ago. The problem has been waxing and waning since onset. Associated symptoms include malaise/fatigue and peripheral edema ("at times"). Pertinent negatives include no shortness of breath. Risk factors for coronary artery disease include dyslipidemia, diabetes mellitus, obesity and sedentary lifestyle. The current treatment provides moderate improvement.  Arthritis Presents for follow-up visit. She complains of pain and stiffness. The symptoms have been stable. Affected locations include the left hip, right hip, left knee, right knee, right MCP and left MCP. Her pain is at a severity of 10/10.  Hyperlipidemia This is a chronic problem. The current episode started more than 1 year ago. Exacerbating diseases include obesity. Pertinent negatives include no shortness of breath. The current treatment provides moderate improvement of lipids. Risk factors for coronary artery disease include dyslipidemia, hypertension and a sedentary lifestyle.      Review of Systems  Constitutional: Positive for malaise/fatigue.  Respiratory: Negative for shortness of breath.   Gastrointestinal: Positive for abdominal pain and heartburn.  Musculoskeletal: Positive for arthritis and stiffness.  All other  systems reviewed and are negative.      Objective:   Physical Exam Vitals reviewed.  Constitutional:      General: She is not in acute distress.    Appearance: She is well-developed and well-nourished.  HENT:     Head: Normocephalic and atraumatic.     Right Ear: Tympanic membrane normal.     Left Ear: Tympanic membrane normal.     Mouth/Throat:     Mouth: Oropharynx is clear and moist.  Eyes:     Pupils: Pupils are equal, round, and reactive to light.  Neck:     Thyroid: No thyromegaly.  Cardiovascular:     Rate and Rhythm: Normal rate and regular rhythm.     Pulses: Intact distal pulses.     Heart sounds: Normal heart sounds. No murmur heard.   Pulmonary:     Effort: Pulmonary effort is normal. No respiratory distress.     Breath sounds: Normal breath sounds. No wheezing.  Abdominal:     General: Bowel sounds are normal. There is no distension.     Palpations: Abdomen is soft.     Tenderness: There is no abdominal tenderness.  Musculoskeletal:        General: No tenderness or edema. Normal range of motion.     Cervical back: Normal range of motion and neck supple.  Skin:    General: Skin is warm and dry.  Neurological:     Mental Status: She is alert and oriented to person, place, and time.     Cranial Nerves: No cranial nerve deficit.     Deep Tendon Reflexes: Reflexes are normal and symmetric.  Psychiatric:        Mood and Affect: Mood and affect normal.  Behavior: Behavior normal.        Thought Content: Thought content normal.        Judgment: Judgment normal.          BP 140/80   Pulse 86   Temp (!) 97.3 F (36.3 C) (Temporal)   Ht _0  (1.626 m)   Wt 189 lb (85.7 kg)   BMI 32.44 kg/m   Assessment & Plan:  [name] comes in today with chief complaint of Gastroesophageal Reflux and Hypertension   Diagnosis and orders addressed:  1. Gastroesophageal reflux disease, unspecified whether esophagitis present Will increase prilosec to  40 mg from 20 mg -Diet discussed- Avoid fried, spicy, citrus foods, caffeine and alcohol -Do not eat 2-3 hours before bedtime -Encouraged small frequent meals -Avoid NSAID's - CMP14+EGFR - CBC with Differential/Platelet - H Pylori, IGM, IGG, IGA AB - omeprazole (PRILOSEC) 40 MG capsule; Take 1 capsule (40 mg total) by mouth daily.  Dispense: 90 capsule; Refill: 3  2. Essential hypertension - CMP14+EGFR - CBC with Differential/Platelet  3. Mixed hyperlipidemia - CMP14+EGFR - CBC with Differential/Platelet  4. Obesity (BMI 30-39.9) - CMP14+EGFR - CBC with Differential/Platelet  5. Vitamin D deficiency - CMP14+EGFR - CBC with Differential/Platelet  6. Left upper quadrant abdominal pain - CMP14+EGFR - CBC with Differential/Platelet - H Pylori, IGM, IGG, IGA AB - omeprazole (PRILOSEC) 40 MG capsule; Take 1 capsule (40 mg total) by mouth daily.  Dispense: 90 capsule; Refill: 3  7. Post-menopause - DG [facility] DEXA   Labs pending Health Maintenance reviewed Diet and exercise encouraged  Follow up plan: 6 months    Evelina Dun, FNP

## 2020-03-12 NOTE — Patient Instructions (Signed)
Food Choices for Gastroesophageal Reflux Disease, Adult When you have gastroesophageal reflux disease (GERD), the foods you eat and your eating habits are very important. Choosing the right foods can help ease your discomfort. Think about working with a food expert (dietitian) to help you make good choices. What are tips for following this plan? Reading food labels  Look for foods that are low in saturated fat. Foods that may help with your symptoms include: ? Foods that have less than 5% of daily value (DV) of fat. ? Foods that have 0 grams of trans fat. Cooking  Do not fry your food.  Cook your food by baking, steaming, grilling, or broiling. These are all methods that do not need a lot of fat for cooking.  To add flavor, try to use herbs that are low in spice and acidity. Meal planning  Choose healthy foods that are low in fat, such as: ? Fruits and vegetables. ? Whole grains. ? Low-fat dairy products. ? Lean meats, fish, and poultry.  Eat small meals often instead of eating 3 large meals each day. Eat your meals slowly in a place where you are relaxed. Avoid bending over or lying down until 2-3 hours after eating.  Limit high-fat foods such as fatty meats or fried foods.  Limit your intake of fatty foods, such as oils, butter, and shortening.  Avoid the following as told by your doctor: ? Foods that cause symptoms. These may be different for different people. Keep a food diary to keep track of foods that cause symptoms. ? Alcohol. ? Drinking a lot of liquid with meals. ? Eating meals during the 2-3 hours before bed.   Lifestyle  Stay at a healthy weight. Ask your doctor what weight is healthy for you. If you need to lose weight, work with your doctor to do so safely.  Exercise for at least 30 minutes on 5 or more days each week, or as told by your doctor.  Wear loose-fitting clothes.  Do not smoke or use any products that contain nicotine or tobacco. If you need help  quitting, ask your doctor.  Sleep with the head of your bed higher than your feet. Use a wedge under the mattress or blocks under the bed frame to raise the head of the bed.  Chew sugar-free gum after meals. What foods should eat? Eat a healthy, well-balanced diet of fruits, vegetables, whole grains, low-fat dairy products, lean meats, fish, and poultry. Each person is different. Foods that may cause symptoms in one person may not cause any symptoms in another person. Work with your doctor to find foods that are safe for you. The items listed above may not be a complete list of what you can eat and drink. Contact a food expert for more options.   What foods should I avoid? Limiting some of these foods may help in managing the symptoms of GERD. Everyone is different. Talk with a food expert or your doctor to help you find the exact foods to avoid, if any. Fruits Any fruits prepared with added fat. Any fruits that cause symptoms. For some people, this may include citrus fruits, such as oranges, grapefruit, pineapple, and lemons. Vegetables Deep-fried vegetables. French fries. Any vegetables prepared with added fat. Any vegetables that cause symptoms. For some people, this may include tomatoes and tomato products, chili peppers, onions and garlic, and horseradish. Grains Pastries or quick breads with added fat. Meats and other proteins High-fat meats, such as fatty beef or pork,   hot dogs, ribs, ham, sausage, salami, and bacon. Fried meat or protein, including fried fish and fried chicken. Nuts and nut butters, in large amounts. Dairy Whole milk and chocolate milk. Sour cream. Cream. Ice cream. Cream cheese. Milkshakes. Fats and oils Butter. Margarine. Shortening. Ghee. Beverages Coffee and tea, with or without caffeine. Carbonated beverages. Sodas. Energy drinks. Fruit juice made with acidic fruits, such as orange or grapefruit. Tomato juice. Alcoholic drinks. Sweets and desserts Chocolate and  cocoa. Donuts. Seasonings and condiments Pepper. Peppermint and spearmint. Added salt. Any condiments, herbs, or seasonings that cause symptoms. For some people, this may include curry, hot sauce, or vinegar-based salad dressings. The items listed above may not be a complete list of what you should not eat and drink. Contact a food expert for more options. Questions to ask your doctor Diet and lifestyle changes are often the first steps that are taken to manage symptoms of GERD. If diet and lifestyle changes do not help, talk with your doctor about taking medicines. Where to find more information  International [employer] for Gastrointestinal Disorders: aboutgerd.org Summary  When you have GERD, food and lifestyle choices are very important in easing your symptoms.  Eat small meals often instead of 3 large meals a day. Eat your meals slowly and in a place where you are relaxed.  Avoid bending over or lying down until 2-3 hours after eating.  Limit high-fat foods such as fatty meats or fried foods. This information is not intended to replace advice given to you by your health care provider. Make sure you discuss any questions you have with your health care provider. Document Revised: 07/15/2019 Document Reviewed: 07/15/2019 Elsevier Patient Education  2021 Elsevier Inc.  

## 2020-03-13 DIAGNOSIS — Z780 Asymptomatic menopausal state: Secondary | ICD-10-CM | POA: Diagnosis not present

## 2020-03-13 DIAGNOSIS — Z13820 Encounter for screening for osteoporosis: Secondary | ICD-10-CM | POA: Diagnosis not present

## 2020-03-13 LAB — CMP14+EGFR
ALT: 43 IU/L — ABNORMAL HIGH (ref 0–32)
AST: 31 IU/L (ref 0–40)
Albumin/Globulin Ratio: 1.9 (ref 1.2–2.2)
Albumin: 4.4 g/dL (ref 3.8–4.8)
Alkaline Phosphatase: 80 IU/L (ref 44–121)
BUN/Creatinine Ratio: 10 — ABNORMAL LOW (ref 12–28)
BUN: 7 mg/dL — ABNORMAL LOW (ref 8–27)
Bilirubin Total: 0.4 mg/dL (ref 0.0–1.2)
CO2: 26 mmol/L (ref 20–29)
Calcium: 9.9 mg/dL (ref 8.7–10.3)
Chloride: 98 mmol/L (ref 96–106)
Creatinine, Ser: 0.68 mg/dL (ref 0.57–1.00)
GFR calc Af Amer: 103 mL/min/{1.73_m2} (ref >59)
GFR calc non Af Amer: 90 mL/min/{1.73_m2} (ref >59)
Globulin, Total: 2.3 g/dL (ref 1.5–4.5)
Glucose: 119 mg/dL — ABNORMAL HIGH (ref 65–99)
Potassium: 4.2 mmol/L (ref 3.5–5.2)
Sodium: 143 mmol/L (ref 134–144)
Total Protein: 6.7 g/dL (ref 6.0–8.5)

## 2020-03-13 LAB — CBC WITH DIFFERENTIAL/PLATELET
Basophils Absolute: 0.1 10*3/uL (ref 0.0–0.2)
Basos: 1 %
EOS (ABSOLUTE): 0 10*3/uL (ref 0.0–0.4)
Eos: 0 %
Hematocrit: 40.4 % (ref 34.0–46.6)
Hemoglobin: 13.4 g/dL (ref 11.1–15.9)
Immature Grans (Abs): 0 10*3/uL (ref 0.0–0.1)
Immature Granulocytes: 0 %
Lymphocytes Absolute: 4 10*3/uL — ABNORMAL HIGH (ref 0.7–3.1)
Lymphs: 37 %
MCH: 27.6 pg (ref 26.6–33.0)
MCHC: 33.2 g/dL (ref 31.5–35.7)
MCV: 83 fL (ref 79–97)
Monocytes Absolute: 0.7 10*3/uL (ref 0.1–0.9)
Monocytes: 7 %
Neutrophils Absolute: 5.9 10*3/uL (ref 1.4–7.0)
Neutrophils: 55 %
Platelets: 409 10*3/uL (ref 150–450)
RBC: 4.86 x10E6/uL (ref 3.77–5.28)
RDW: 14.2 % (ref 11.7–15.4)
WBC: 10.7 10*3/uL (ref 3.4–10.8)

## 2020-03-13 LAB — H PYLORI, IGM, IGG, IGA AB
H pylori, IgM Abs: <9 units (ref 0.0–8.9)
H. pylori, IgA Abs: <9 units (ref 0.0–8.9)
H. pylori, IgG AbS: 0.29 Index Value (ref 0.00–0.79)

## 2020-03-23 NOTE — Progress Notes (Signed)
Pt r/c about labs 

## 2020-03-24 ENCOUNTER — Other Ambulatory Visit: Payer: Self-pay | Admitting: Family

## 2020-03-24 DIAGNOSIS — R1012 Left upper quadrant pain: Secondary | ICD-10-CM

## 2020-03-24 DIAGNOSIS — K219 Gastro-esophageal reflux disease without esophagitis: Secondary | ICD-10-CM

## 2020-04-21 ENCOUNTER — Other Ambulatory Visit: Payer: Self-pay

## 2020-04-21 ENCOUNTER — Ambulatory Visit: Payer: Medicare HMO | Admitting: Physician Assistant

## 2020-04-21 ENCOUNTER — Encounter: Payer: Self-pay | Admitting: Physician Assistant

## 2020-04-21 VITALS — BP 138/80 | HR 82 | Ht 64.0 in | Wt 190.0 lb

## 2020-04-21 DIAGNOSIS — R140 Abdominal distension (gaseous): Secondary | ICD-10-CM

## 2020-04-21 DIAGNOSIS — M940 Chondrocostal junction syndrome [Tietze]: Secondary | ICD-10-CM

## 2020-04-21 DIAGNOSIS — R1013 Epigastric pain: Secondary | ICD-10-CM | POA: Diagnosis not present

## 2020-04-21 NOTE — Progress Notes (Signed)
Chief Complaint: Left upper quadrant pain, GERD  HPI:    [name] is a 70 year old Caucasian female with a past medical history of fibromyalgia and reflux, known to Dr. Henrene Pastor, who was referred to me by Sharion Balloon, FNP for a complaint of left upper quadrant pain and GERD.      04/28/2014 screening colonoscopy with moderate diverticulosis in the left colon otherwise normal.  Repeat recommended in 10 years.    03/12/2020 patient seen by PCP and complained of abdominal pain, belching and heartburn.  This is started more than a year ago and seem to come and go.  Her Prilosec was increased to 40 mg from 20 mg and an antireflux diet and lifestyle was discussed.  Also discussed some left upper quadrant abdominal pain at that time.  Labs were done including a CBC, CMP and H. pylori studies.  H. pylori testing came back negative, CBC normal.  CMP showed a minimally elevated ALT at 43 and otherwise normal.    Today, the patient presents to clinic and explains that over the past year anytime she eats she feels like she will bloat and develop an epigastric discomfort typically rated as a 4-5/10 which radiates through to her back.  Most recently her PCP increased Omeprazole to 40 mg daily but does not feel like this has made any change.  Tells me that sometimes she will lay down at night and is uncomfortable and so she will massage from right under her sternum down towards her bellybutton which seems to maybe help a little.  Tells me that a long time ago "Dr. Velora Heckler told me I had a large hiatal hernia".    Also complains of a chronic left upper quadrant pain which has been there at least 13 years.  Tells me that it initially started and felt like a "rock" between her breast and her rib cage.  She tells me that she already does not like wearing bras but now she "hates wearing bras", and will take them off whenever she can because the pressure of this seems to make this worse.  It seems to increase in intensity  and become sharper at times, typically with movement, unassociated with eating, no relief with a bowel movement or passing gas.  Rated as a constant 6-7/10.  Has noticed that Ibuprofen sometimes eases it slightly.  Explains that she did talk to her PCP about it but "they never seem that interested".  Tells me she has never had imaging of this area other than an x-ray which "did not show anything years ago".    Denies fever, chills, weight loss, heartburn, reflux or change in bowel habits.  Past Medical History:  Diagnosis Date  . Arthritis    hands, feet, back, hips, knees  . Fibromyalgia   . GERD (gastroesophageal reflux disease)   . History of hiatal hernia   . Hyperlipidemia   . Hypertension     Past Surgical History:  Procedure Laterality Date  . BREAST REDUCTION SURGERY Bilateral 08/09/2018   Procedure: MAMMARY REDUCTION  (BREAST);  Surgeon: Wallace Going, DO;  Location: [facility];  Service: Plastics;  Laterality: Bilateral;  . CARPAL TUNNEL RELEASE Bilateral   . CHOLECYSTECTOMY    . COLONOSCOPY  2001  . JOINT REPLACEMENT    . REDUCTION MAMMAPLASTY     07/2018  . 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 TOTAL HIP ARTHROPLASTY Left 06/25/2019   Procedure: TOTAL HIP ARTHROPLASTY ANTERIOR APPROACH;  Surgeon: Paralee Cancel, MD;  Location: [facility];  Service: Orthopedics;  Laterality: Left;  70 min  . TUBAL LIGATION    . 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    . UPPER GASTROINTESTINAL ENDOSCOPY      Current Outpatient Medications  Medication Sig Dispense Refill  . Ascorbic Acid (VITAMIN C) 1000 MG tablet Take 1,000 mg by mouth 2 (two) times a day.     Marland Kitchen atorvastatin (LIPITOR) 40 MG tablet TAKE ONE TABLET BY MOUTH DAILY 90 tablet 1  . carboxymethylcellulose (REFRESH PLUS) 0.5 % SOLN Place 1 drop into both eyes 2 (two)  times daily as needed.    . Cholecalciferol (VITAMIN D) 50 MCG (2000 UT) tablet Take 2,000 Units by mouth daily.    . fluorometholone (FML) 0.1 % ophthalmic suspension Place 1 drop into both eyes daily.    Marland Kitchen gabapentin (NEURONTIN) 100 MG capsule Take 1 capsule (100 mg total) by mouth 2 (two) times daily. 180 capsule 2  . hydrochlorothiazide (HYDRODIURIL) 25 MG tablet TAKE ONE TABLET BY MOUTH DAILY 90 tablet 0  . metoprolol succinate (TOPROL-XL) 50 MG 24 hr tablet TAKE ONE TABLET BY MOUTH DAILY 90 tablet 0  . omeprazole (PRILOSEC) 40 MG capsule Take 1 capsule (40 mg total) by mouth daily. 90 capsule 3  . Probiotic Product (PROBIOTIC-10 PO) Take by mouth.    . RESTASIS 0.05 % ophthalmic emulsion 1 drop 2 (two) times daily.    Marland Kitchen zinc gluconate 50 MG tablet Take 50 mg by mouth daily.     No current facility-administered medications for this visit.    Allergies as of 04/21/2020  . (No Known Allergies)    Family History  Problem Relation Age of Onset  . Hypertension Father   . Heart disease Father   . Cancer Father   . Hypertension Mother   . Heart disease Mother   . Diabetes Mother   . Hypertension Brother   . Hypertension Sister   . Colon cancer Neg Hx   . Breast cancer Neg Hx     Social History   Socioeconomic History  . Marital status: Widowed    Spouse name: Not on file  . Number of children: 2  . Years of education: Not on file  . Highest education level: High school graduate  Occupational History  . Occupation: retired  Tobacco Use  . Smoking status: Never Smoker  . Smokeless tobacco: Never Used  Vaping Use  . Vaping Use: Never used  Substance and Sexual Activity  . Alcohol use: No  . Drug use: No  . Sexual activity: Not Currently  Other Topics Concern  . Not on file  Social History Narrative  . Not on file   Social Determinants of Health   Financial Resource Strain: Low Risk   . Difficulty of Paying Living Expenses: Not hard at all  Food Insecurity: No  Food Insecurity  . Worried About Charity fundraiser in the Last Year: Never true  . Ran Out of Food in the Last Year: Never true  Transportation Needs: No Transportation Needs  . Lack of Transportation (Medical): No  . Lack of Transportation (Non-Medical): No  Physical Activity: Inactive  . Days of Exercise per Week: 0 days  . Minutes of Exercise per Session: 0 min  Stress: No Stress Concern Present  . Feeling of  Stress : Not at all  Social Connections: Socially Isolated  . Frequency of Communication with Friends and Family: More than three times a week  . Frequency of Social Gatherings with Friends and Family: More than three times a week  . Attends Religious Services: Never  . Active Member of Clubs or Organizations: No  . Attends Archivist Meetings: Never  . Marital Status: Widowed  Intimate Partner Violence: Not At Risk  . Fear of Current or Ex-Partner: No  . Emotionally Abused: No  . Physically Abused: No  . Sexually Abused: No    Review of Systems:    Constitutional: No weight loss, fever or chills Skin: No rash Cardiovascular: No chest pain Respiratory: No SOB  Gastrointestinal: See HPI and otherwise negative Genitourinary: No dysuria  Neurological: No headache, dizziness or syncope Musculoskeletal: No new muscle or joint pain Hematologic: No bleeding  Psychiatric: No history of depression or anxiety   Physical Exam:  Vital signs: BP 138/80   Pulse 82   Ht 5\' 4"  (1.626 m)   Wt 190 lb (86.2 kg)   BMI 32.61 kg/m   Constitutional:   Pleasant Elderly Caucasian female appears to be in NAD, Well developed, Well nourished, alert and cooperative Head:  Normocephalic and atraumatic. Eyes:   PEERL, EOMI. No icterus. Conjunctiva pink. Ears:  Normal auditory acuity. Neck:  Supple Throat: Oral cavity and pharynx without inflammation, swelling or lesion.  Respiratory: Respirations even and unlabored. Lungs clear to auscultation bilaterally.   No wheezes,  crackles, or rhonchi.  Cardiovascular: Normal S1, S2. No MRG. Regular rate and rhythm. No peripheral edema, cyanosis or pallor.  Gastrointestinal:  Soft, nondistended, mild epigastric ttp. No rebound or guarding. Normal bowel sounds. No appreciable masses or hepatomegaly. Rectal:  Not performed.  Msk:  Symmetrical without gross deformities. Without edema, no deformity or joint abnormality. +ttp over left sided rib cage Neurologic:  Alert and  oriented x4;  grossly normal neurologically.  Skin:   Dry and intact without significant lesions or rashes. Psychiatric:  Demonstrates good judgement and reason without abnormal affect or behaviors.  RELEVANT LABS AND IMAGING: CBC    Component Value Date/Time   WBC 10.7 03/12/2020 1605   WBC 13.8 (H) 06/26/2019 0306   RBC 4.86 03/12/2020 1605   RBC 3.75 (L) 06/26/2019 0306   HGB 13.4 03/12/2020 1605   HCT 40.4 03/12/2020 1605   PLT 409 03/12/2020 1605   MCV 83 03/12/2020 1605   MCH 27.6 03/12/2020 1605   MCH 28.3 06/26/2019 0306   MCHC 33.2 03/12/2020 1605   MCHC 31.7 06/26/2019 0306   RDW 14.2 03/12/2020 1605   LYMPHSABS 4.0 (H) 03/12/2020 1605   MONOABS 0.6 05/15/2009 1700   EOSABS 0.0 03/12/2020 1605   BASOSABS 0.1 03/12/2020 1605    CMP     Component Value Date/Time   NA 143 03/12/2020 1605   K 4.2 03/12/2020 1605   CL 98 03/12/2020 1605   CO2 26 03/12/2020 1605   GLUCOSE 119 (H) 03/12/2020 1605   GLUCOSE 213 (H) 06/26/2019 0306   BUN 7 (L) 03/12/2020 1605   CREATININE 0.68 03/12/2020 1605   CALCIUM 9.9 03/12/2020 1605   PROT 6.7 03/12/2020 1605   ALBUMIN 4.4 03/12/2020 1605   AST 31 03/12/2020 1605   ALT 43 (H) 03/12/2020 1605   ALKPHOS 80 03/12/2020 1605   BILITOT 0.4 03/12/2020 1605   GFRNONAA 90 03/12/2020 1605   GFRAA 103 03/12/2020 1605    Assessment: 1.  Chronic left upper quadrant TTP: Seems tender palpation over the rib cage, more than likely costochondritis, though this has been going on for 13 years, which  makes this slightly less likely, still favor musculoskeletal etiology 2.  Epigastric pain/bloating: History of GERD, no change with increase in Omeprazole over the past month; consider gastritis+/-PUD versus SIBO  Plan: 1.  Continue Omeprazole 40 mg daily for now, this may even need to be increased. 2.  Scheduled patient for diagnostic EGD in the [place] given epigastric pain and bloating and left upper quadrant pain (though left upper quadrant pain is more than likely costochondritis).  This is scheduled with Dr. Henrene Pastor.  Patient did receive a detailed list of risks for the procedure and she agrees to proceed. 3.  Discussed that patient may want to try placing a Salonpas or heating pad over her rib cage in that area as she is very tender to palpation.  In the future could discuss further imaging of her rib cage versus CT of the abdomen pelvis to ensure there is no abdominal etiology. 4.  Also discussed that if EGD is unrevealing could test for SIBO. 5.  Patient to follow in clinic per recommendations from Dr. Henrene Pastor after time of procedure.  Theresa Newer, PA-C [facility] 04/21/2020, 11:29 AM  Cc: Sharion Balloon, FNP

## 2020-04-21 NOTE — Patient Instructions (Signed)
If you are age 69 or older, your body mass index should be between 23-30. Your Body mass index is 32.61 kg/m. If this is out of the aforementioned range listed, please consider follow up with your Primary Care Provider.  If you are age 59 or younger, your body mass index should be between 19-25. Your Body mass index is 32.61 kg/m. If this is out of the aformentioned range listed, please consider follow up with your Primary Care Provider.   You have been scheduled for an endoscopy. Please follow written instructions given to you at your visit today. If you use inhalers (even only as needed), please bring them with you on the day of your procedure.  Continue Omeprazole   Try salon patches on rib cage.  Thank you for choosing me and [facility].  Ellouise Newer, PA-C

## 2020-04-21 NOTE — Progress Notes (Signed)
Assessment and plan as noted 

## 2020-05-05 ENCOUNTER — Ambulatory Visit (AMBULATORY_SURGERY_CENTER): Payer: Medicare HMO | Admitting: Internal Medicine

## 2020-05-05 ENCOUNTER — Encounter: Payer: Self-pay | Admitting: Internal Medicine

## 2020-05-05 ENCOUNTER — Other Ambulatory Visit: Payer: Self-pay

## 2020-05-05 VITALS — BP 122/54 | HR 76 | Temp 97.8°F | Resp 15 | Ht 64.0 in | Wt 190.0 lb

## 2020-05-05 DIAGNOSIS — R1013 Epigastric pain: Secondary | ICD-10-CM

## 2020-05-05 DIAGNOSIS — M940 Chondrocostal junction syndrome [Tietze]: Secondary | ICD-10-CM | POA: Diagnosis not present

## 2020-05-05 DIAGNOSIS — R140 Abdominal distension (gaseous): Secondary | ICD-10-CM

## 2020-05-05 DIAGNOSIS — K219 Gastro-esophageal reflux disease without esophagitis: Secondary | ICD-10-CM

## 2020-05-05 MED ORDER — SODIUM CHLORIDE 0.9 % IV SOLN
500.0000 mL | Freq: Once | INTRAVENOUS | Status: DC
Start: 2020-05-05 — End: 2020-05-05

## 2020-05-05 NOTE — Patient Instructions (Signed)
Please read handouts provided. Continue present medications. Symptomatic therapies for musculoskeletal pain. Follow-up with your PCP.     YOU HAD AN ENDOSCOPIC PROCEDURE TODAY AT [facility]:   Refer to the procedure report that was given to you for any specific questions about what was found during the examination.  If the procedure report does not answer your questions, please call your gastroenterologist to clarify.  If you requested that your care partner not be given the details of your procedure findings, then the procedure report has been included in a sealed envelope for you to review at your convenience later.  YOU SHOULD EXPECT: Some feelings of bloating in the abdomen. Passage of more gas than usual.  Walking can help get rid of the air that was put into your GI tract during the procedure and reduce the bloating. If you had a lower endoscopy (such as a colonoscopy or flexible sigmoidoscopy) you may notice spotting of blood in your stool or on the toilet paper. If you underwent a bowel prep for your procedure, you may not have a normal bowel movement for a few days.  Please Note:  You might notice some irritation and congestion in your nose or some drainage.  This is from the oxygen used during your procedure.  There is no need for concern and it should clear up in a day or so.  SYMPTOMS TO REPORT IMMEDIATELY:    Following upper endoscopy (EGD)  Vomiting of blood or coffee ground material  New chest pain or pain under the shoulder blades  Painful or persistently difficult swallowing  New shortness of breath  Fever of 100F or higher  Black, tarry-looking stools  For urgent or emergent issues, a gastroenterologist can be reached at any hour by calling [phone]. Do not use MyChart messaging for urgent concerns.    DIET:  We do recommend a small meal at first, but then you may proceed to your regular diet.  Drink plenty of fluids but you should avoid  alcoholic beverages for 24 hours.  ACTIVITY:  You should plan to take it easy for the rest of today and you should NOT DRIVE or use heavy machinery until tomorrow (because of the sedation medicines used during the test).    FOLLOW UP: Our staff will call the number listed on your records 48-72 hours following your procedure to check on you and address any questions or concerns that you may have regarding the information given to you following your procedure. If we do not reach you, we will leave a message.  We will attempt to reach you two times.  During this call, we will ask if you have developed any symptoms of COVID 19. If you develop any symptoms (ie: fever, flu-like symptoms, shortness of breath, cough etc.) before then, please call [phone].  If you test positive for Covid 19 in the 2 weeks post procedure, please call and report this information to [place].    If any biopsies were taken you will be contacted by phone or by letter within the next 1-3 weeks.  Please call us at [phone] if you have not heard about the biopsies in 3 weeks.    SIGNATURES/CONFIDENTIALITY: You and/or your care partner have signed paperwork which will be entered into your electronic medical record.  These signatures attest to the fact that that the information above on your After Visit Summary has been reviewed and is understood.  Full responsibility of the confidentiality of this discharge  information lies with you and/or your care-partner. 

## 2020-05-05 NOTE — Progress Notes (Signed)
VS-CW  Pt's states no medical or surgical changes since previsit or office visit.  

## 2020-05-05 NOTE — Op Note (Signed)
[place] Patient Name: [name] Procedure Date: 05/05/2020 10:26 AM MRN: [id number] Endoscopist: Docia Chuck. Henrene Pastor , MD Age: 70 Referring MD:  Date of Birth: 12/13/1950 Gender: Female Account #: [id number] Procedure:                Upper GI endoscopy Indications:              Epigastric abdominal pain, Abdominal pain in the                            left upper quadrant, Abdominal bloating Medicines:                Monitored Anesthesia Care Procedure:                Pre-Anesthesia Assessment:                           - Prior to the procedure, a History and Physical                            was performed, and patient medications and                            allergies were reviewed. The patient's tolerance of                            previous anesthesia was also reviewed. The risks                            and benefits of the procedure and the sedation                            options and risks were discussed with the patient.                            All questions were answered, and informed consent                            was obtained. Prior Anticoagulants: The patient has                            taken no previous anticoagulant or antiplatelet                            agents. ASA Grade Assessment: II - A patient with                            mild systemic disease. After reviewing the risks                            and benefits, the patient was deemed in                            satisfactory condition to undergo the procedure.  After obtaining informed consent, the endoscope was                            passed under direct vision. Throughout the                            procedure, the patient's blood pressure, pulse, and                            oxygen saturations were monitored continuously. The                            Endoscope was introduced through the mouth, and                            advanced to the  second part of duodenum. The upper                            GI endoscopy was accomplished without difficulty.                            The patient tolerated the procedure well. Scope In: Scope Out: Findings:                 The esophagus was normal.                           The stomach was normal save small hiatal hernia and                            diminutive benign fundic gland type polyps.                           The examined duodenum was normal.                           The cardia and gastric fundus were normal on                            retroflexion. Complications:            No immediate complications. Estimated Blood Loss:     Estimated blood loss: none. Impression:               1. GERD                           2. Unremarkable EGD                           3. Musculoskeletal pain. Recommendation:           - Patient has a contact number available for                            emergencies. The signs and symptoms of potential  delayed complications were discussed with the                            patient. Return to normal activities tomorrow.                            Written discharge instructions were provided to the                            patient.                           - Resume previous diet.                           - Continue present medications.                           - Symptomatic therapies for musculoskeletal pain.                            Follow-up with your PCP Docia Chuck. Henrene Pastor, MD 05/05/2020 10:48:28 AM This report has been signed electronically.

## 2020-05-05 NOTE — Progress Notes (Signed)
A and O x3. Report to RN. Tolerated MAC anesthesia well.Teeth unchanged after procedure.

## 2020-05-07 ENCOUNTER — Telehealth: Payer: Self-pay | Admitting: *Deleted

## 2020-05-07 NOTE — Telephone Encounter (Signed)
  Follow up Call-  Call back number 05/05/2020  Post procedure Call Back phone  # [phone]  Permission to leave phone message Yes  Some recent data might be hidden     Patient questions:  Do you have a fever, pain , or abdominal swelling? No. Pain Score  0 *  Have you tolerated food without any problems? Yes.    Have you been able to return to your normal activities? Yes.    Do you have any questions about your discharge instructions: Diet   No. Medications  No. Follow up visit  No.  Do you have questions or concerns about your Care? No.  Actions: * If pain score is 4 or above: 1. No action needed, pain <4.Have you developed a fever since your procedure? no  2.   Have you had an respiratory symptoms (SOB or cough) since your procedure? no  3.   Have you tested positive for COVID 19 since your procedure no  4.   Have you had any family members/close contacts diagnosed with the COVID 19 since your procedure?  no   If yes to any of these questions please route to Joylene John, RN and Joella Prince, RN

## 2020-05-21 ENCOUNTER — Other Ambulatory Visit: Payer: Self-pay | Admitting: Family

## 2020-05-21 DIAGNOSIS — E782 Mixed hyperlipidemia: Secondary | ICD-10-CM

## 2020-05-21 DIAGNOSIS — E8881 Metabolic syndrome: Secondary | ICD-10-CM

## 2020-05-21 DIAGNOSIS — M7062 Trochanteric bursitis, left hip: Secondary | ICD-10-CM | POA: Diagnosis not present

## 2020-05-22 ENCOUNTER — Other Ambulatory Visit: Payer: Self-pay | Admitting: Family

## 2020-05-22 DIAGNOSIS — I10 Essential (primary) hypertension: Secondary | ICD-10-CM

## 2020-06-26 DIAGNOSIS — Z96642 Presence of left artificial hip joint: Secondary | ICD-10-CM | POA: Diagnosis not present

## 2020-06-26 DIAGNOSIS — M7062 Trochanteric bursitis, left hip: Secondary | ICD-10-CM | POA: Diagnosis not present

## 2020-06-26 DIAGNOSIS — Z96641 Presence of right artificial hip joint: Secondary | ICD-10-CM | POA: Diagnosis not present

## 2020-08-07 IMAGING — RF DG C-ARM 1-60 MIN-NO REPORT
1 series · 4 of 4 positions shown · non-contrast
Comparison: None.

CLINICAL DATA: Left anterior hip replacement

EXAM:
OPERATIVE LEFT HIP (WITH PELVIS IF PERFORMED)
TECHNIQUE: Fluoroscopic spot image(s) were submitted for interpretation
post-operatively.

[Series 1: unknown protocol · 0.20mm/px · 4 of 4 slices shown]
[im 1/4]
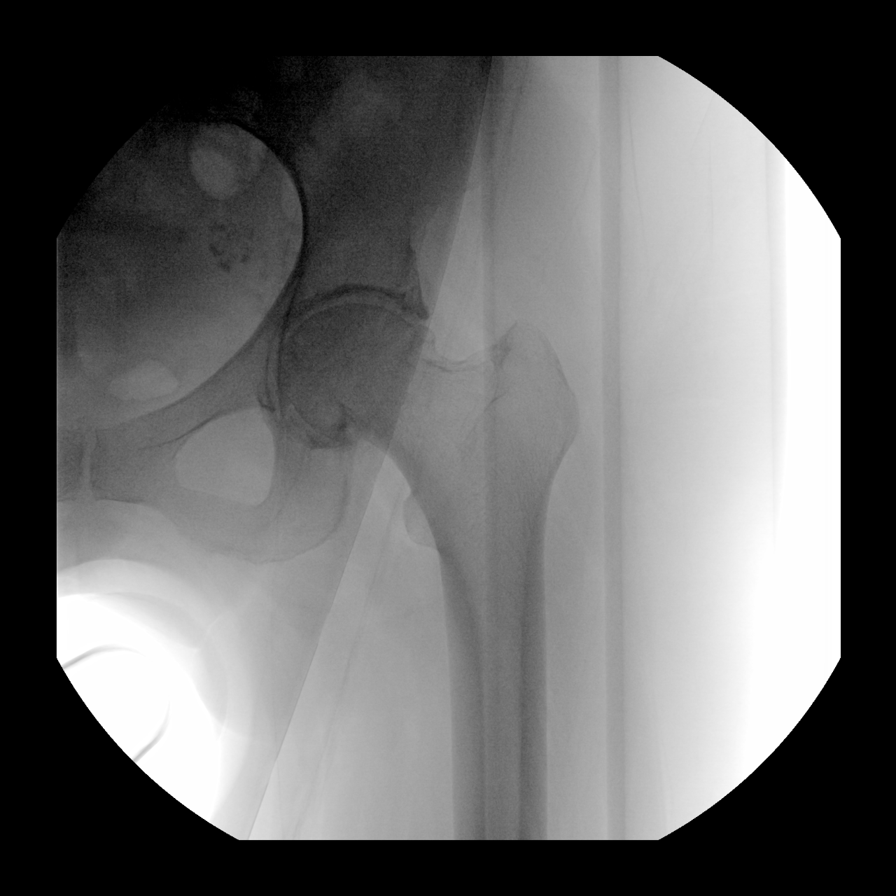
[im 2/4]
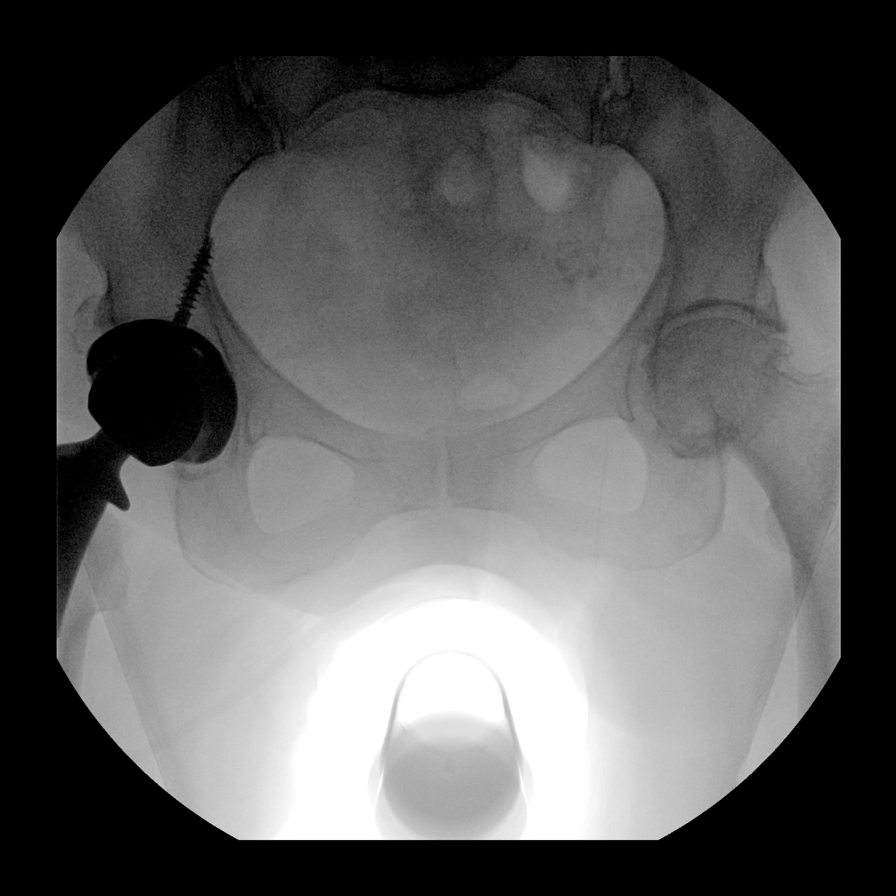
[im 3/4]
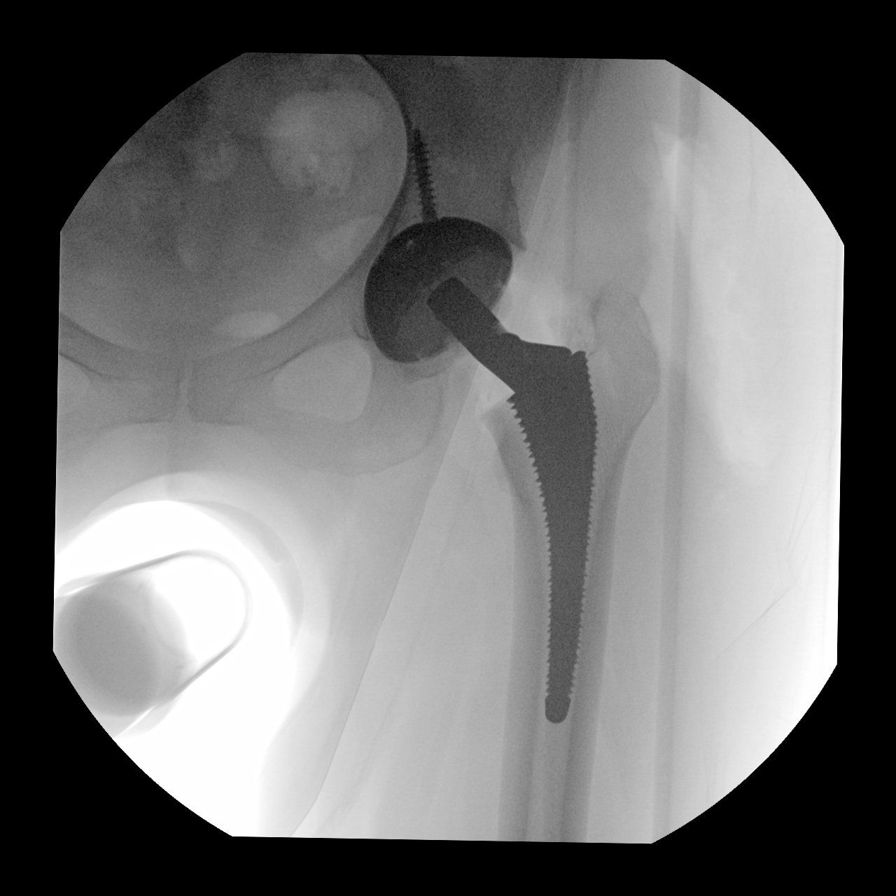
[im 4/4]
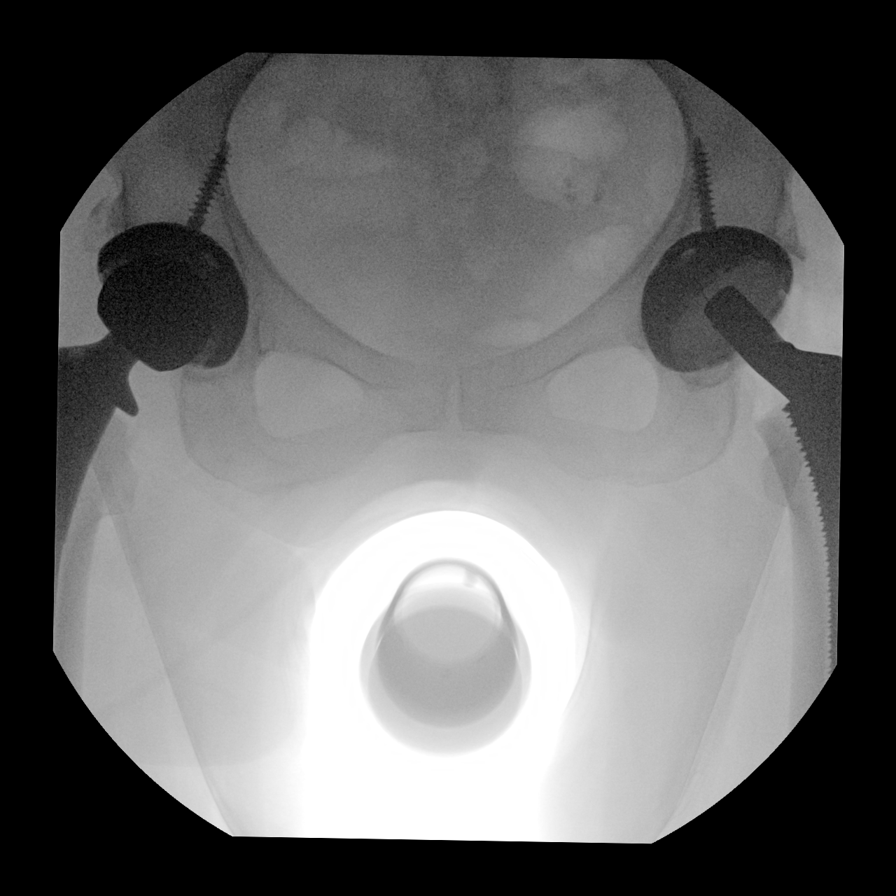

[4 of 4 positions shown; findings below may reference images not displayed]

FINDINGS: Four fluoroscopic spot views obtained in the operating room. Initial
images pre arthroplasty demonstrate osteoarthritis. Subsequent
placement of left hip arthroplasty. Femoral head component is not
present on the final image. Total fluoroscopy time 12 seconds.
IMPRESSION: Procedural fluoroscopy during left hip arthroplasty.

## 2020-08-20 ENCOUNTER — Ambulatory Visit (INDEPENDENT_AMBULATORY_CARE_PROVIDER_SITE_OTHER): Payer: Medicare HMO

## 2020-08-20 VITALS — Ht 64.0 in | Wt 175.0 lb

## 2020-08-20 DIAGNOSIS — Z0000 Encounter for general adult medical examination without abnormal findings: Secondary | ICD-10-CM

## 2020-08-20 NOTE — Patient Instructions (Signed)
[name] , Thank you for taking time to come for your Medicare Wellness Visit. I appreciate your ongoing commitment to your health goals. Please review the following plan we discussed and let me know if I can assist you in the future.   Screening recommendations/referrals: Colonoscopy: Done 04/28/2014 - Repeat in 10 years Mammogram: Done 11/25/2019 - Repeat annually Bone Density: Done 03/13/2020 - Repeat every 2 years Recommended yearly ophthalmology/optometry visit for glaucoma screening and checkup Recommended yearly dental visit for hygiene and checkup  Vaccinations: Influenza vaccine: Done 10/18/2019 - Repeat annually Pneumococcal vaccine: Done 11/12/2015 & 05/18/2017 Tdap vaccine: Done 03/10/2014 - Repeat in 10 years Shingles vaccine: one 05/24/2017 & 08/14/2017   Covid-19: Done 08/14/19 & 09/12/19  Advanced directives: Advance directive discussed with you today. Even though you declined this today, please call our office should you change your mind, and we can give you the proper paperwork for you to fill out.   Conditions/risks identified: Aim for 30 minutes of exercise or brisk walking each day, drink 6-8 glasses of water and eat lots of fruits and vegetables.   Next appointment: Follow up in one year for your annual wellness visit    Preventive Care 65 Years and Older, Female Preventive care refers to lifestyle choices and visits with your health care provider that can promote health and wellness. What does preventive care include? A yearly physical exam. This is also called an annual well check. Dental exams once or twice a year. Routine eye exams. Ask your health care provider how often you should have your eyes checked. Personal lifestyle choices, including: Daily care of your teeth and gums. Regular physical activity. Eating a healthy diet. Avoiding tobacco and drug use. Limiting alcohol use. Practicing safe sex. Taking low-dose aspirin every day. Taking vitamin and mineral  supplements as recommended by your health care provider. What happens during an annual well check? The services and screenings done by your health care provider during your annual well check will depend on your age, overall health, lifestyle risk factors, and family history of disease. Counseling  Your health care provider may ask you questions about your: Alcohol use. Tobacco use. Drug use. Emotional well-being. Home and relationship well-being. Sexual activity. Eating habits. History of falls. Memory and ability to understand (cognition). Work and work Statistician. Reproductive health. Screening  You may have the following tests or measurements: Height, weight, and BMI. Blood pressure. Lipid and cholesterol levels. These may be checked every 5 years, or more frequently if you are over 31 years old. Skin check. Lung cancer screening. You may have this screening every year starting at age 53 if you have a 30-pack-year history of smoking and currently smoke or have quit within the past 15 years. Fecal occult blood test (FOBT) of the stool. You may have this test every year starting at age 60. Flexible sigmoidoscopy or colonoscopy. You may have a sigmoidoscopy every 5 years or a colonoscopy every 10 years starting at age 45. Hepatitis C blood test. Hepatitis B blood test. Sexually transmitted disease (STD) testing. Diabetes screening. This is done by checking your blood sugar (glucose) after you have not eaten for a while (fasting). You may have this done every 1-3 years. Bone density scan. This is done to screen for osteoporosis. You may have this done starting at age 23. Mammogram. This may be done every 1-2 years. Talk to your health care provider about how often you should have regular mammograms. Talk with your health care provider about  your test results, treatment options, and if necessary, the need for more tests. Vaccines  Your health care provider may recommend certain  vaccines, such as: Influenza vaccine. This is recommended every year. Tetanus, diphtheria, and acellular pertussis (Tdap, Td) vaccine. You may need a Td booster every 10 years. Zoster vaccine. You may need this after age 28. Pneumococcal 13-valent conjugate (PCV13) vaccine. One dose is recommended after age 76. Pneumococcal polysaccharide (PPSV23) vaccine. One dose is recommended after age 36. Talk to your health care provider about which screenings and vaccines you need and how often you need them. This information is not intended to replace advice given to you by your health care provider. Make sure you discuss any questions you have with your health care provider. Document Released: 01/30/2015 Document Revised: 09/23/2015 Document Reviewed: 11/04/2014 Elsevier Interactive Patient Education  2017 [employer] Prevention in the Home Falls can cause injuries. They can happen to people of all ages. There are many things you can do to make your home safe and to help prevent falls. What can I do on the outside of my home? Regularly fix the edges of walkways and driveways and fix any cracks. Remove anything that might make you trip as you walk through a door, such as a raised step or threshold. Trim any bushes or trees on the path to your home. Use bright outdoor lighting. Clear any walking paths of anything that might make someone trip, such as rocks or tools. Regularly check to see if handrails are loose or broken. Make sure that both sides of any steps have handrails. Any raised decks and porches should have guardrails on the edges. Have any leaves, snow, or ice cleared regularly. Use sand or salt on walking paths during winter. Clean up any spills in your garage right away. This includes oil or grease spills. What can I do in the bathroom? Use night lights. Install grab bars by the toilet and in the tub and shower. Do not use towel bars as grab bars. Use non-skid mats or decals in  the tub or shower. If you need to sit down in the shower, use a plastic, non-slip stool. Keep the floor dry. Clean up any water that spills on the floor as soon as it happens. Remove soap buildup in the tub or shower regularly. Attach bath mats securely with double-sided non-slip rug tape. Do not have throw rugs and other things on the floor that can make you trip. What can I do in the bedroom? Use night lights. Make sure that you have a light by your bed that is easy to reach. Do not use any sheets or blankets that are too big for your bed. They should not hang down onto the floor. Have a firm chair that has side arms. You can use this for support while you get dressed. Do not have throw rugs and other things on the floor that can make you trip. What can I do in the kitchen? Clean up any spills right away. Avoid walking on wet floors. Keep items that you use a lot in easy-to-reach places. If you need to reach something above you, use a strong step stool that has a grab bar. Keep electrical cords out of the way. Do not use floor polish or wax that makes floors slippery. If you must use wax, use non-skid floor wax. Do not have throw rugs and other things on the floor that can make you trip. What can I do with  my stairs? Do not leave any items on the stairs. Make sure that there are handrails on both sides of the stairs and use them. Fix handrails that are broken or loose. Make sure that handrails are as long as the stairways. Check any carpeting to make sure that it is firmly attached to the stairs. Fix any carpet that is loose or worn. Avoid having throw rugs at the top or bottom of the stairs. If you do have throw rugs, attach them to the floor with carpet tape. Make sure that you have a light switch at the top of the stairs and the bottom of the stairs. If you do not have them, ask someone to add them for you. What else can I do to help prevent falls? Wear shoes that: Do not have high  heels. Have rubber bottoms. Are comfortable and fit you well. Are closed at the toe. Do not wear sandals. If you use a stepladder: Make sure that it is fully opened. Do not climb a closed stepladder. Make sure that both sides of the stepladder are locked into place. Ask someone to hold it for you, if possible. Clearly mark and make sure that you can see: Any grab bars or handrails. First and last steps. Where the edge of each step is. Use tools that help you move around (mobility aids) if they are needed. These include: Canes. Walkers. Scooters. Crutches. Turn on the lights when you go into a dark area. Replace any light bulbs as soon as they burn out. Set up your furniture so you have a clear path. Avoid moving your furniture around. If any of your floors are uneven, fix them. If there are any pets around you, be aware of where they are. Review your medicines with your doctor. Some medicines can make you feel dizzy. This can increase your chance of falling. Ask your doctor what other things that you can do to help prevent falls. This information is not intended to replace advice given to you by your health care provider. Make sure you discuss any questions you have with your health care provider. Document Released: 10/30/2008 Document Revised: 06/11/2015 Document Reviewed: 02/07/2014 Elsevier Interactive Patient Education  2017 [employer].

## 2020-08-20 NOTE — Progress Notes (Signed)
Subjective:   [name] is a 70 y.o. female who presents for Medicare Annual (Subsequent) preventive examination.  Virtual Visit via Telephone Note  I connected with  [name] on 08/20/20 at  9:45 AM EDT by telephone and verified that I am speaking with the correct person using two identifiers.  Location: Patient: Home Provider: [facility] Persons participating in the virtual visit: patient/Nurse Health Advisor   I discussed the limitations, risks, security and privacy concerns of performing an evaluation and management service by telephone and the availability of in person appointments. The patient expressed understanding and agreed to proceed.  Interactive audio and video telecommunications were attempted between this nurse and patient, however failed, due to patient having technical difficulties OR patient did not have access to video capability.  We continued and completed visit with audio only.  Some vital signs may be absent or patient reported.   Kirbie Stodghill E Lynkin Saini, LPN   Review of Systems     Cardiac Risk Factors include: advanced age (>52mn, >>70women);dyslipidemia;obesity (BMI >30kg/m2);sedentary lifestyle;hypertension     Objective:    Today's Vitals   08/20/20 0853  Weight: 175 lb (79.4 kg)  Height: '5\' 4"'$  (1.626 m)   Body mass index is 30.04 kg/m.  Advanced Directives 08/20/2020 08/21/2019 08/20/2019 06/25/2019 06/13/2019 05/06/2019 04/09/2019  Does Patient Have a Medical Advance Directive? No No No No No No No  Does patient want to make changes to medical advance directive? - - - - - - -  Would patient like information on creating a medical advance directive? No - Patient declined No - Patient declined No - Patient declined No - Patient declined No - Patient declined - No - Patient declined    Current Medications (verified) Outpatient Encounter Medications as of 08/20/2020  Medication Sig   Ascorbic Acid (VITAMIN C) 1000 MG tablet Take 1,000 mg by mouth 2 (two)  times a day.    aspirin 81 MG chewable tablet Chew 81 mg by mouth daily.   atorvastatin (LIPITOR) 40 MG tablet TAKE ONE TABLET BY MOUTH DAILY   carboxymethylcellulose (REFRESH PLUS) 0.5 % SOLN Place 1 drop into both eyes 2 (two) times daily as needed.   Cholecalciferol (VITAMIN D) 50 MCG (2000 UT) tablet Take 2,000 Units by mouth daily.   fluorometholone (FML) 0.1 % ophthalmic suspension Place 1 drop into both eyes daily.   gabapentin (NEURONTIN) 100 MG capsule TAKE ONE CAPSULE BY MOUTH TWICE A DAY   hydrochlorothiazide (HYDRODIURIL) 25 MG tablet TAKE ONE TABLET BY MOUTH DAILY   metoprolol succinate (TOPROL-XL) 50 MG 24 hr tablet TAKE ONE TABLET BY MOUTH DAILY   omeprazole (PRILOSEC) 20 MG capsule omeprazole 20 mg capsule,delayed release   Probiotic Product (PROBIOTIC-10 PO) Take by mouth.   RESTASIS 0.05 % ophthalmic emulsion 1 drop 2 (two) times daily.   zinc gluconate 50 MG tablet Take 50 mg by mouth daily.   [DISCONTINUED] omeprazole (PRILOSEC) 40 MG capsule Take 1 capsule (40 mg total) by mouth daily.   No facility-administered encounter medications on file as of 08/20/2020.    Allergies (verified) Patient has no known allergies.   History: Past Medical History:  Diagnosis Date   Arthritis    hands, feet, back, hips, knees   Fibromyalgia    GERD (gastroesophageal reflux disease)    History of hiatal hernia    Hyperlipidemia    Hypertension    Past Surgical History:  Procedure Laterality Date   BREAST REDUCTION SURGERY Bilateral 08/09/2018  Procedure: MAMMARY REDUCTION  (BREAST);  Surgeon: Wallace Going, DO;  Location: [place];  Service: Plastics;  Laterality: Bilateral;   CARPAL TUNNEL RELEASE Bilateral    CHOLECYSTECTOMY     COLONOSCOPY  2001   JOINT REPLACEMENT     REDUCTION MAMMAPLASTY     07/2018   TOTAL HIP ARTHROPLASTY Right 04/09/2019   Procedure: RIGHT TOTAL HIP ARTHROPLASTY ANTERIOR APPROACH;  Surgeon: Paralee Cancel, MD;  Location: [facility];  Service: Orthopedics;  Laterality: Right;  70 mins   TOTAL HIP ARTHROPLASTY Left 06/25/2019   Procedure: TOTAL HIP ARTHROPLASTY ANTERIOR APPROACH;  Surgeon: Paralee Cancel, MD;  Location: [facility];  Service: Orthopedics;  Laterality: Left;  70 min   TUBAL LIGATION     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     UPPER GASTROINTESTINAL ENDOSCOPY     Family History  Problem Relation Age of Onset   Hypertension Father    Heart disease Father    Cancer Father    Hypertension Mother    Heart disease Mother    Diabetes Mother    Hypertension Brother    Hypertension Sister    Colon cancer Neg Hx    Breast cancer Neg Hx    Esophageal cancer Neg Hx    Rectal cancer Neg Hx    Stomach cancer Neg Hx    Social History   Socioeconomic History   Marital status: Widowed    Spouse name: Not on file   Number of children: 2   Years of education: Not on file   Highest education level: High school graduate  Occupational History   Occupation: retired  Tobacco Use   Smoking status: Never   Smokeless tobacco: Never  Vaping Use   Vaping Use: Never used  Substance and Sexual Activity   Alcohol use: No   Drug use: No   Sexual activity: Not Currently  Other Topics Concern   Not on file  Social History Narrative   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 Her granddaughter lives next door.    Social Determinants of Health   Financial Resource Strain: Low Risk    Difficulty of Paying Living Expenses: Not hard at all  Food Insecurity: No Food Insecurity   Worried About Charity fundraiser in the Last Year: Never true   [place] in the Last Year: Never true  Transportation Needs: No Transportation Needs   Lack of Transportation (Medical): No   Lack of Transportation (Non-Medical): No  Physical Activity: Inactive   Days of Exercise per Week: 0 days   Minutes of Exercise per Session: 0 min  Stress: No Stress Concern Present   Feeling of Stress : Not at all  Social  Connections: Socially Isolated   Frequency of Communication with Friends and Family: More than three times a week   Frequency of Social Gatherings with Friends and Family: Twice a week   Attends Religious Services: Never   Marine scientist or Organizations: No   Attends Archivist Meetings: Never   Marital Status: Widowed    Tobacco Counseling Counseling given: Not Answered   Clinical Intake:  Pre-visit preparation completed: Yes  Pain : No/denies pain     BMI - recorded: 30.04 Nutritional Status: BMI > 30  Obese Nutritional Risks: None Diabetes: No  How often do you need to have someone help you when you read instructions, pamphlets, or other written materials from your doctor  or pharmacy?: 1 - Never  Diabetic? No  Interpreter Needed?: No  Information entered by :: Jerrick Farve, LPN   Activities of Daily Living In your present state of health, do you have any difficulty performing the following activities: 08/20/2020  Hearing? N  Vision? N  Difficulty concentrating or making decisions? N  Walking or climbing stairs? Y  Comment knees bother her  Dressing or bathing? N  Doing errands, shopping? N  Preparing Food and eating ? N  Using the Toilet? N  In the past six months, have you accidently leaked urine? Y  Comment wears pads for protection  Do you have problems with loss of bowel control? N  Managing your Medications? N  Managing your Finances? N  Housekeeping or managing your Housekeeping? N  Some recent data might be hidden    Patient Care Team: Sharion Balloon, FNP as PCP - General (Nurse Practitioner) Mcarthur Rossetti, MD as Consulting Physician (Orthopedic Surgery) Paralee Cancel, MD as Consulting Physician (Orthopedic Surgery)  Indicate any recent Medical Services you may have received from other than [facility] providers in the past year (date may be approximate).     Assessment:   This is a routine wellness examination for  [place].  Hearing/Vision screen Hearing Screening - Comments:: Denies hearing difficulties  Vision Screening - Comments:: Wears otc reading glasses only - up to date with annual eye exams with Dr Alanda Slim  Dietary issues and exercise activities discussed: Current Exercise Habits: Home exercise routine (she is planning to start going to [facility] with her sister soon), Type of exercise: walking, Time (Minutes): 10, Frequency (Times/Week): 7, Weekly Exercise (Minutes/Week): 70, Intensity: Mild, Exercise limited by: orthopedic condition(s)   Goals Addressed             This Visit's Progress    DIET - EAT MORE FRUITS AND VEGETABLES   On track    Exercise 150 min/wk Moderate Activity   Not on track    Prevent falls   On track      Depression Screen [facility] 2/9 Scores 08/20/2020 03/12/2020 08/20/2019 07/25/2019 04/02/2019 10/16/2018 02/15/2018  PHQ - 2 Score 0 0 1 0 0 0 0  PHQ- 9 Score - - - - - - 2    Fall Risk Fall Risk  08/20/2020 03/12/2020 08/20/2019 07/25/2019 04/02/2019  Falls in the past year? 0 0 0 0 0  Number falls in past yr: 0 - - - -  Injury with Fall? 0 - - - -  Risk for fall due to : Orthopedic patient;Medication side effect - - - -  Follow up Falls prevention discussed - - - -    FALL RISK PREVENTION PERTAINING TO THE HOME:  Any stairs in or around the home? Yes  If so, are there any without handrails? No  Home free of loose throw rugs in walkways, pet beds, electrical cords, etc? Yes  Adequate lighting in your home to reduce risk of falls? Yes   ASSISTIVE DEVICES UTILIZED TO PREVENT FALLS:  Life alert? No  Use of a cane, [place] or w/c? No  Grab bars in the bathroom? No  Shower chair or bench in shower? No  Elevated toilet seat or a handicapped toilet? No   TIMED UP AND GO:  Was the test performed? No . Telephonic visit  Cognitive Function: Normal cognitive status assessed by direct observation by this Nurse Health Advisor. No abnormalities found.    MMSE - Mini Mental State Exam  05/24/2017  Orientation  to time 5  Orientation to Place 5  Registration 3  Attention/ Calculation 5  Recall 3  Language- name 2 objects 2  Language- repeat 1  Language- follow 3 step command 3  Language- read & follow direction 1  Write a sentence 1  Copy design 1  Total score 30     6CIT Screen 08/20/2019 07/30/2018  What Year? 0 points 0 points  What month? 0 points 0 points  What time? 0 points -  Count back from 20 0 points 0 points  Months in reverse 0 points 0 points  Repeat phrase 0 points 0 points  Total Score 0 -    Immunizations Immunization History  Administered Date(s) Administered   Influenza, High Dose Seasonal PF 11/10/2017, 11/16/2018   Influenza,inj,Quad PF,6+ Mos 01/15/2015, 11/12/2015, 10/25/2016   Influenza,inj,quad, With Preservative 11/16/2018   Influenza-Unspecified 01/29/2014, 10/18/2019   Moderna Sars-Covid-2 Vaccination 08/14/2019, 09/12/2019   PPD Test 03/10/2014   Pneumococcal Conjugate-13 11/12/2015   Pneumococcal Polysaccharide-23 05/18/2017   Tdap 03/10/2014   Zoster Recombinat (Shingrix) 05/24/2017, 08/14/2017   Zoster, Live 11/28/2012    TDAP status: Up to date  Flu Vaccine status: Up to date  Pneumococcal vaccine status: Up to date  Covid-19 vaccine status: Completed vaccines  Qualifies for Shingles Vaccine? Yes   Zostavax completed Yes   Shingrix Completed?: Yes  Screening Tests Health Maintenance  Topic Date Due   COVID-19 Vaccine (3 - Booster for Moderna series) 02/12/2020   INFLUENZA VACCINE  08/17/2020   MAMMOGRAM  11/24/2021   TETANUS/TDAP  03/10/2024   COLONOSCOPY (Pts 45-56yr Insurance coverage will need to be confirmed)  04/27/2024   DEXA SCAN  Completed   Hepatitis C Screening  Completed   PNA vac Low Risk Adult  Completed   Zoster Vaccines- Shingrix  Completed   HPV VACCINES  Aged Out    Health Maintenance  Health Maintenance Due  Topic Date Due   COVID-19 Vaccine (3 - Booster for Moderna series)  02/12/2020   INFLUENZA VACCINE  08/17/2020    Colorectal cancer screening: Type of screening: Colonoscopy. Completed 04/28/2014. Repeat every 10 years  Mammogram status: Completed 11/25/2019. Repeat every year  Bone Density status: Completed 03/13/2020. Results reflect: Bone density results: NORMAL. Repeat every 2-5 years.  Lung Cancer Screening: (Low Dose CT Chest recommended if Age 69-80years, 30 pack-year currently smoking OR have quit w/in 15years.) does not qualify.   Additional Screening:  Hepatitis C Screening: does qualify; Completed 01/15/2015  Vision Screening: Recommended annual ophthalmology exams for early detection of glaucoma and other disorders of the eye. Is the patient up to date with their annual eye exam?  Yes  Who is the provider or what is the name of the office in which the patient attends annual eye exams? [facility] If pt is not established with a provider, would they like to be referred to a provider to establish care? No .   Dental Screening: Recommended annual dental exams for proper oral hygiene  Community Resource Referral / Chronic Care Management: CRR required this visit?  No   CCM required this visit?  No      Plan:     I have personally reviewed and noted the following in the patient's chart:   Medical and social history Use of alcohol, tobacco or illicit drugs  Current medications and supplements including opioid prescriptions.  Functional ability and status Nutritional status Physical activity Advanced directives List of other physicians Hospitalizations, surgeries, and ER visits in previous  12 months Vitals Screenings to include cognitive, depression, and falls Referrals and appointments  In addition, I have reviewed and discussed with patient certain preventive protocols, quality metrics, and best practice recommendations. A written personalized care plan for preventive services as well as general preventive health recommendations were  provided to patient.     Sandrea Hammond, LPN   [id number]   Nurse Notes: None

## 2020-08-22 ENCOUNTER — Other Ambulatory Visit: Payer: Self-pay | Admitting: Family

## 2020-08-22 DIAGNOSIS — I10 Essential (primary) hypertension: Secondary | ICD-10-CM

## 2020-09-09 ENCOUNTER — Ambulatory Visit: Payer: Medicare HMO | Admitting: Family

## 2020-09-10 ENCOUNTER — Encounter: Payer: Self-pay | Admitting: Family

## 2020-09-15 ENCOUNTER — Other Ambulatory Visit: Payer: Self-pay

## 2020-09-15 ENCOUNTER — Ambulatory Visit (INDEPENDENT_AMBULATORY_CARE_PROVIDER_SITE_OTHER): Payer: Medicare HMO | Admitting: Family

## 2020-09-15 ENCOUNTER — Encounter: Payer: Self-pay | Admitting: Family

## 2020-09-15 VITALS — BP 133/68 | HR 68 | Temp 97.3°F | Ht 64.0 in | Wt 178.2 lb

## 2020-09-15 DIAGNOSIS — E669 Obesity, unspecified: Secondary | ICD-10-CM | POA: Diagnosis not present

## 2020-09-15 DIAGNOSIS — M546 Pain in thoracic spine: Secondary | ICD-10-CM | POA: Diagnosis not present

## 2020-09-15 DIAGNOSIS — K219 Gastro-esophageal reflux disease without esophagitis: Secondary | ICD-10-CM | POA: Diagnosis not present

## 2020-09-15 DIAGNOSIS — I10 Essential (primary) hypertension: Secondary | ICD-10-CM | POA: Diagnosis not present

## 2020-09-15 DIAGNOSIS — E782 Mixed hyperlipidemia: Secondary | ICD-10-CM

## 2020-09-15 DIAGNOSIS — E8881 Metabolic syndrome: Secondary | ICD-10-CM | POA: Diagnosis not present

## 2020-09-15 DIAGNOSIS — E559 Vitamin D deficiency, unspecified: Secondary | ICD-10-CM

## 2020-09-15 DIAGNOSIS — Z96642 Presence of left artificial hip joint: Secondary | ICD-10-CM | POA: Diagnosis not present

## 2020-09-15 DIAGNOSIS — E785 Hyperlipidemia, unspecified: Secondary | ICD-10-CM

## 2020-09-15 DIAGNOSIS — G8929 Other chronic pain: Secondary | ICD-10-CM | POA: Diagnosis not present

## 2020-09-15 MED ORDER — HYDROCHLOROTHIAZIDE 25 MG PO TABS: 25.0000 mg | ORAL_TABLET | Freq: Every day | ORAL | 2 refills | Status: DC

## 2020-09-15 MED ORDER — METOPROLOL SUCCINATE ER 50 MG PO TB24: 50.0000 mg | ORAL_TABLET | Freq: Every day | ORAL | 1 refills | Status: DC

## 2020-09-15 MED ORDER — ATORVASTATIN CALCIUM 40 MG PO TABS: 40.0000 mg | ORAL_TABLET | Freq: Every day | ORAL | 2 refills | Status: DC

## 2020-09-15 MED ORDER — GABAPENTIN 100 MG PO CAPS: 100.0000 mg | ORAL_CAPSULE | Freq: Two times a day (BID) | ORAL | 2 refills | Status: DC

## 2020-09-15 NOTE — Addendum Note (Signed)
Addended by: Ladean Raya on: 09/15/2020 02:23 PM   Modules accepted: Orders

## 2020-09-15 NOTE — Patient Instructions (Signed)
Health Maintenance After Age 69 After age 69, you are at a higher risk for certain long-term diseases and infections as well as injuries from falls. Falls are a major cause of broken bones and head injuries in people who are older than age 69. Getting regular preventive care can help to keep you healthy and well. Preventive care includes getting regular testing and making lifestyle changes as recommended by your health care provider. Talk with your health care provider about: Which screenings and tests you should have. A screening is a test that checks for a disease when you have no symptoms. A diet and exercise plan that is right for you. What should I know about screenings and tests to prevent falls? Screening and testing are the best ways to find a health problem early. Early diagnosis and treatment give you the best chance of managing medical conditions that are common after age 69. Certain conditions and lifestyle choices may make you more likely to have a fall. Your health care provider may recommend: Regular vision checks. Poor vision and conditions such as cataracts can make you more likely to have a fall. If you wear glasses, make sure to get your prescription updated if your vision changes. Medicine review. Work with your health care provider to regularly review all of the medicines you are taking, including over-the-counter medicines. Ask your health care provider about any side effects that may make you more likely to have a fall. Tell your health care provider if any medicines that you take make you feel dizzy or sleepy. Osteoporosis screening. Osteoporosis is a condition that causes the bones to get weaker. This can make the bones weak and cause them to break more easily. Blood pressure screening. Blood pressure changes and medicines to control blood pressure can make you feel dizzy. Strength and balance checks. Your health care provider may recommend certain tests to check your strength and  balance while standing, walking, or changing positions. Foot health exam. Foot pain and numbness, as well as not wearing proper footwear, can make you more likely to have a fall. Depression screening. You may be more likely to have a fall if you have a fear of falling, feel emotionally low, or feel unable to do activities that you used to do. Alcohol use screening. Using too much alcohol can affect your balance and may make you more likely to have a fall. What actions can I take to lower my risk of falls? General instructions Talk with your health care provider about your risks for falling. Tell your health care provider if: You fall. Be sure to tell your health care provider about all falls, even ones that seem minor. You feel dizzy, sleepy, or off-balance. Take over-the-counter and prescription medicines only as told by your health care provider. These include any supplements. Eat a healthy diet and maintain a healthy weight. A healthy diet includes low-fat dairy products, low-fat (lean) meats, and fiber from whole grains, beans, and lots of fruits and vegetables. Home safety Remove any tripping hazards, such as rugs, cords, and clutter. Install safety equipment such as grab bars in bathrooms and safety rails on stairs. Keep rooms and walkways well-lit. Activity  Follow a regular exercise program to stay fit. This will help you maintain your balance. Ask your health care provider what types of exercise are appropriate for you. If you need a cane or [place], use it as recommended by your health care provider. Wear supportive shoes that have nonskid soles. Lifestyle Do not   drink alcohol if your health care provider tells you not to drink. If you drink alcohol, limit how much you have: 0-1 drink a day for women. 0-2 drinks a day for men. Be aware of how much alcohol is in your drink. In the [place] one drink equals one typical bottle of beer (12 oz), one-half glass of wine (5 oz), or one shot of  hard liquor (1 oz). Do not use any products that contain nicotine or tobacco, such as cigarettes and e-cigarettes. If you need help quitting, ask your health care provider. Summary Having a healthy lifestyle and getting preventive care can help to protect your health and wellness after age 69. Screening and testing are the best way to find a health problem early and help you avoid having a fall. Early diagnosis and treatment give you the best chance for managing medical conditions that are more common for people who are older than age 69. Falls are a major cause of broken bones and head injuries in people who are older than age 69. Take precautions to prevent a fall at home. Work with your health care provider to learn what changes you can make to improve your health and wellness and to prevent falls. This information is not intended to replace advice given to you by your health care provider. Make sure you discuss any questions you have with your health care provider. Document Revised: 03/13/2020 Document Reviewed: 12/20/2019 Elsevier Patient Education  2022 Elsevier Inc.  

## 2020-09-15 NOTE — Progress Notes (Signed)
Subjective:    Patient ID: [name], female    DOB: 1950-12-03, 69 y.o.   MRN: [id number]  Chief Complaint  Patient presents with   Medical Management of Chronic Issues   Pt presents to the office today for chronic follow up. She has hx of bilateral hip replacements since our visit.  Hypertension This is a chronic problem. The current episode started more than 1 year ago. The problem has been resolved since onset. The problem is controlled. Pertinent negatives include no malaise/fatigue, peripheral edema or shortness of breath. Risk factors for coronary artery disease include dyslipidemia and obesity. The current treatment provides moderate improvement.  Gastroesophageal Reflux She complains of belching and heartburn. This is a chronic problem. The current episode started more than 1 year ago. The problem occurs occasionally. Risk factors include obesity. She has tried a PPI for the symptoms. The treatment provided moderate relief.  Hyperlipidemia This is a chronic problem. The current episode started more than 1 year ago. Exacerbating diseases include obesity. Pertinent negatives include no shortness of breath. Current antihyperlipidemic treatment includes statins. The current treatment provides moderate improvement of lipids. Risk factors for coronary artery disease include dyslipidemia, hypertension, a sedentary lifestyle and post-menopausal.  Back Pain This is a chronic problem. The current episode started more than 1 year ago. The problem occurs intermittently. The problem has been waxing and waning since onset. The pain is present in the lumbar spine. The pain is mild.     Review of Systems  Constitutional:  Negative for malaise/fatigue.  Respiratory:  Negative for shortness of breath.   Gastrointestinal:  Positive for heartburn.  Musculoskeletal:  Positive for back pain.  All other systems reviewed and are negative.     Objective:   Physical Exam Vitals reviewed.   Constitutional:      General: She is not in acute distress.    Appearance: She is well-developed.  HENT:     Head: Normocephalic and atraumatic.     Right Ear: Tympanic membrane normal.     Left Ear: Tympanic membrane normal.  Eyes:     Pupils: Pupils are equal, round, and reactive to light.  Neck:     Thyroid: No thyromegaly.  Cardiovascular:     Rate and Rhythm: Normal rate and regular rhythm.     Heart sounds: Murmur heard.  Pulmonary:     Effort: Pulmonary effort is normal. No respiratory distress.     Breath sounds: Normal breath sounds. No wheezing.  Abdominal:     General: Bowel sounds are normal. There is no distension.     Palpations: Abdomen is soft.     Tenderness: There is no abdominal tenderness.  Musculoskeletal:        General: No tenderness. Normal range of motion.     Cervical back: Normal range of motion and neck supple.  Skin:    General: Skin is warm and dry.  Neurological:     Mental Status: She is alert and oriented to person, place, and time.     Cranial Nerves: No cranial nerve deficit.     Deep Tendon Reflexes: Reflexes are normal and symmetric.  Psychiatric:        Behavior: Behavior normal.        Thought Content: Thought content normal.        Judgment: Judgment normal.      BP 133/68   Pulse 68   Temp (!) 97.3 F (36.3 C) (Temporal)   Ht '5\' 4"'$  (1.626  m)   Wt 178 lb 3.2 oz (80.8 kg)   BMI 30.59 kg/m      Assessment & Plan:  [name] comes in today with chief complaint of Medical Management of Chronic Issues   Diagnosis and orders addressed:  1. Essential hypertension - metoprolol succinate (TOPROL-XL) 50 MG 24 hr tablet; Take 1 tablet (50 mg total) by mouth daily. Take with or immediately following a meal.  Dispense: 90 tablet; Refill: 1 - hydrochlorothiazide (HYDRODIURIL) 25 MG tablet; Take 1 tablet (25 mg total) by mouth daily.  Dispense: 90 tablet; Refill: 2  2. Mixed hyperlipidemia - atorvastatin (LIPITOR) 40 MG  tablet; Take 1 tablet (40 mg total) by mouth daily.  Dispense: 90 tablet; Refill: 2  3. Metabolic syndrome - atorvastatin (LIPITOR) 40 MG tablet; Take 1 tablet (40 mg total) by mouth daily.  Dispense: 90 tablet; Refill: 2  4. Gastroesophageal reflux disease, unspecified whether esophagitis present  5. Chronic bilateral thoracic back pain  6. S/P hip replacement, left  7. Dyslipidemia  8. Obesity (BMI 30-39.9)  9. Vitamin D deficiency   Labs pending Health Maintenance reviewed Diet and exercise encouraged  Follow up plan: 6 months    Evelina Dun, FNP

## 2020-09-16 LAB — CMP14+EGFR
ALT: 29 IU/L (ref 0–32)
AST: 21 IU/L (ref 0–40)
Albumin/Globulin Ratio: 2 (ref 1.2–2.2)
Albumin: 4.3 g/dL (ref 3.8–4.8)
Alkaline Phosphatase: 71 IU/L (ref 44–121)
BUN/Creatinine Ratio: 12 (ref 12–28)
BUN: 9 mg/dL (ref 8–27)
Bilirubin Total: 0.5 mg/dL (ref 0.0–1.2)
CO2: 25 mmol/L (ref 20–29)
Calcium: 9.5 mg/dL (ref 8.7–10.3)
Chloride: 100 mmol/L (ref 96–106)
Creatinine, Ser: 0.77 mg/dL (ref 0.57–1.00)
Globulin, Total: 2.2 g/dL (ref 1.5–4.5)
Glucose: 113 mg/dL — ABNORMAL HIGH (ref 65–99)
Potassium: 3.8 mmol/L (ref 3.5–5.2)
Sodium: 141 mmol/L (ref 134–144)
Total Protein: 6.5 g/dL (ref 6.0–8.5)
eGFR: 83 mL/min/{1.73_m2} (ref >59)

## 2020-09-16 LAB — CBC WITH DIFFERENTIAL/PLATELET
Basophils Absolute: 0 10*3/uL (ref 0.0–0.2)
Basos: 0 %
EOS (ABSOLUTE): 0 10*3/uL (ref 0.0–0.4)
Eos: 0 %
Hematocrit: 39.3 % (ref 34.0–46.6)
Hemoglobin: 13 g/dL (ref 11.1–15.9)
Immature Grans (Abs): 0 10*3/uL (ref 0.0–0.1)
Immature Granulocytes: 0 %
Lymphocytes Absolute: 3.8 10*3/uL — ABNORMAL HIGH (ref 0.7–3.1)
Lymphs: 40 %
MCH: 28.2 pg (ref 26.6–33.0)
MCHC: 33.1 g/dL (ref 31.5–35.7)
MCV: 85 fL (ref 79–97)
Monocytes Absolute: 0.5 10*3/uL (ref 0.1–0.9)
Monocytes: 6 %
Neutrophils Absolute: 5.1 10*3/uL (ref 1.4–7.0)
Neutrophils: 54 %
Platelets: 376 10*3/uL (ref 150–450)
RBC: 4.61 x10E6/uL (ref 3.77–5.28)
RDW: 14.1 % (ref 11.7–15.4)
WBC: 9.5 10*3/uL (ref 3.4–10.8)

## 2020-09-16 LAB — LIPID PANEL
Chol/HDL Ratio: 2.9 ratio (ref 0.0–4.4)
Cholesterol, Total: 148 mg/dL (ref 100–199)
HDL: 51 mg/dL (ref >39)
LDL Chol Calc (NIH): 76 mg/dL (ref 0–99)
Triglycerides: 116 mg/dL (ref 0–149)
VLDL Cholesterol Cal: 21 mg/dL (ref 5–40)

## 2020-11-26 ENCOUNTER — Encounter: Payer: Self-pay | Admitting: Family

## 2020-11-26 ENCOUNTER — Ambulatory Visit (INDEPENDENT_AMBULATORY_CARE_PROVIDER_SITE_OTHER): Payer: Medicare HMO | Admitting: Family

## 2020-11-26 DIAGNOSIS — K649 Unspecified hemorrhoids: Secondary | ICD-10-CM

## 2020-11-26 DIAGNOSIS — B3731 Acute candidiasis of vulva and vagina: Secondary | ICD-10-CM | POA: Diagnosis not present

## 2020-11-26 MED ORDER — FLUCONAZOLE 150 MG PO TABS
150.0000 mg | ORAL_TABLET | ORAL | 0 refills | Status: DC | PRN
Start: 2020-11-26 — End: 2021-02-16

## 2020-11-26 MED ORDER — HYDROCORTISONE ACETATE 25 MG RE SUPP: 25.0000 mg | Freq: Two times a day (BID) | RECTAL | 0 refills | Status: AC

## 2020-11-26 NOTE — Progress Notes (Signed)
Virtual Visit  Note Due to COVID-19 pandemic this visit was conducted virtually. This visit type was conducted due to national recommendations for restrictions regarding the COVID-19 Pandemic (e.g. social distancing, sheltering in place) in an effort to limit this patient's exposure and mitigate transmission in our community. All issues noted in this document were discussed and addressed.  A physical exam was not performed with this format.  I connected with [name] on 11/26/20 at 11:12 AM  by telephone and verified that I am speaking with the correct person using two identifiers. [name] is currently located at home and no one is currently with her during visit. The provider, Evelina Dun, FNP is located in their office at time of visit.  I discussed the limitations, risks, security and privacy concerns of performing an evaluation and management service by telephone and the availability of in person appointments. I also discussed with the patient that there may be a patient responsible charge related to this service. The patient expressed understanding and agreed to proceed.  [name] are scheduled for a virtual visit with your provider today.    Just as we do with appointments in the office, we must obtain your consent to participate.  Your consent will be active for this visit and any virtual visit you may have with one of our providers in the next 365 days.    If you have a MyChart account, I can also send a copy of this consent to you electronically.  All virtual visits are billed to your insurance company just like a traditional visit in the office.  As this is a virtual visit, video technology does not allow for your provider to perform a traditional examination.  This may limit your provider's ability to fully assess your condition.  If your provider identifies any concerns that need to be evaluated in person or the need to arrange testing such as labs, EKG, etc, we will  make arrangements to do so.    Although advances in technology are sophisticated, we cannot ensure that it will always work on either your end or our end.  If the connection with a video visit is poor, we may have to switch to a telephone visit.  With either a video or telephone visit, we are not always able to ensure that we have a secure connection.   I need to obtain your verbal consent now.   Are you willing to proceed with your visit today?   [name] has provided verbal consent on 11/26/2020 for a virtual visit (video or telephone).   Evelina Dun, [place] 11/26/2020  11:14 AM    History and Present Illness:  PT calls the office today with vaginal discharge and hemorrhoids over the last two weeks.   She reports she has used OTC monistat without relief. Reports she is very itching.   She is also complaining of hemorrhoids with feeling a small bulge and scant amount of blood. Tried OTC suppositories without relief.  Vaginal Itching The patient's primary symptoms include genital itching and vaginal discharge (slight). The patient's pertinent negatives include no genital odor or vaginal bleeding. The pain is mild. Pertinent negatives include no constipation, diarrhea, discolored urine, flank pain, frequency, painful intercourse or sore throat. The vaginal discharge was yellow. She has tried antifungals for the symptoms. The treatment provided mild relief.    Review of Systems  HENT:  Negative for sore throat.   Gastrointestinal:  Negative for constipation and diarrhea.  Genitourinary:  Positive for vaginal discharge (slight). Negative for flank pain and frequency.    Observations/Objective: No SOB or distress noted   Assessment and Plan: 1. Vagina, candidiasis Keep clean and dry Start Diflucan  Continue OTC creams as needed Cotton underwear - fluconazole (DIFLUCAN) 150 MG tablet; Take 1 tablet (150 mg total) by mouth every three (3) days as needed.  Dispense: 3 tablet;  Refill: 0  2. Hemorrhoids, unspecified hemorrhoid type Increase fiber in diet Force fluids Avoid straining  - hydrocortisone (ANUSOL-HC) 25 MG suppository; Place 1 suppository (25 mg total) rectally 2 (two) times daily.  Dispense: 12 suppository; Refill: 0   Call office if symptoms worsen or do not improve    I discussed the assessment and treatment plan with the patient. The patient was provided an opportunity to ask questions and all were answered. The patient agreed with the plan and demonstrated an understanding of the instructions.   The patient was advised to call back or seek an in-person evaluation if the symptoms worsen or if the condition fails to improve as anticipated.  The above assessment and management plan was discussed with the patient. The patient verbalized understanding of and has agreed to the management plan. Patient is aware to call the clinic if symptoms persist or worsen. Patient is aware when to return to the clinic for a follow-up visit. Patient educated on when it is appropriate to go to the emergency department.   Time call ended: 11:23 AM    I provided 11 minutes of  non face-to-face time during this encounter.    Evelina Dun, FNP

## 2021-02-16 ENCOUNTER — Encounter: Payer: Self-pay | Admitting: Family

## 2021-02-16 ENCOUNTER — Ambulatory Visit (INDEPENDENT_AMBULATORY_CARE_PROVIDER_SITE_OTHER): Payer: Medicare HMO | Admitting: Family

## 2021-02-16 VITALS — BP 136/79 | HR 76 | Temp 98.5°F | Wt 185.4 lb

## 2021-02-16 DIAGNOSIS — N898 Other specified noninflammatory disorders of vagina: Secondary | ICD-10-CM | POA: Diagnosis not present

## 2021-02-16 DIAGNOSIS — B3731 Acute candidiasis of vulva and vagina: Secondary | ICD-10-CM

## 2021-02-16 LAB — WET PREP FOR TRICH, YEAST, CLUE
Clue Cell Exam: NEGATIVE
Trichomonas Exam: NEGATIVE

## 2021-02-16 MED ORDER — TERCONAZOLE 0.4 % VA CREA: 1.0000 | TOPICAL_CREAM | Freq: Every day | VAGINAL | 0 refills | Status: DC

## 2021-02-16 MED ORDER — FLUCONAZOLE 150 MG PO TABS: 150.0000 mg | ORAL_TABLET | ORAL | 0 refills | Status: DC | PRN

## 2021-02-16 NOTE — Patient Instructions (Signed)

## 2021-02-16 NOTE — Progress Notes (Signed)
° °  Subjective:    Patient ID: [name], female    DOB: 07-21-1950, 71 y.o.   MRN: [id number]  Chief Complaint  Patient presents with   Vaginal Itching    Vaginal Itching The patient's primary symptoms include genital itching and vaginal discharge. The patient's pertinent negatives include no genital odor. The current episode started in the past 7 days. Pertinent negatives include no chills, constipation, diarrhea, frequency, headaches, sore throat or urgency. The vaginal discharge was yellow. She has tried antifungals for the symptoms. The treatment provided moderate relief.     Review of Systems  Constitutional:  Negative for chills.  HENT:  Negative for sore throat.   Gastrointestinal:  Negative for constipation and diarrhea.  Genitourinary:  Positive for vaginal discharge. Negative for frequency and urgency.  Neurological:  Negative for headaches.  All other systems reviewed and are negative.     Objective:   Physical Exam Vitals reviewed.  Constitutional:      General: She is not in acute distress.    Appearance: She is well-developed. She is obese.  HENT:     Head: Normocephalic and atraumatic.     Right Ear: Tympanic membrane normal.     Left Ear: Tympanic membrane normal.  Eyes:     Pupils: Pupils are equal, round, and reactive to light.  Neck:     Thyroid: No thyromegaly.  Cardiovascular:     Rate and Rhythm: Normal rate and regular rhythm.     Heart sounds: Normal heart sounds. No murmur heard. Pulmonary:     Effort: Pulmonary effort is normal. No respiratory distress.     Breath sounds: Normal breath sounds. No wheezing.  Abdominal:     General: Bowel sounds are normal. There is no distension.     Palpations: Abdomen is soft.     Tenderness: There is no abdominal tenderness.  Genitourinary:    Comments: Vagina tissue pink and not irritated  Musculoskeletal:        General: No tenderness. Normal range of motion.     Cervical back: Normal range of  motion and neck supple.  Skin:    General: Skin is warm and dry.  Neurological:     Mental Status: She is alert and oriented to person, place, and time.     Cranial Nerves: No cranial nerve deficit.     Deep Tendon Reflexes: Reflexes are normal and symmetric.  Psychiatric:        Behavior: Behavior normal.        Thought Content: Thought content normal.        Judgment: Judgment normal.     BP 136/79    Pulse 76    Temp 98.5 F (36.9 C) (Oral)    Wt 185 lb 6.4 oz (84.1 kg)    BMI 31.82 kg/m       Assessment & Plan:  ADYLINE HUBERTY comes in today with chief complaint of Vaginal Itching   Diagnosis and orders addressed:  1. Vagina, candidiasis - WET PREP FOR TRICH, YEAST, CLUE - fluconazole (DIFLUCAN) 150 MG tablet; Take 1 tablet (150 mg total) by mouth every three (3) days as needed.  Dispense: 3 tablet; Refill: 0 - terconazole (TERAZOL 7) 0.4 % vaginal cream; Place 1 applicator vaginally at bedtime.  Dispense: 45 g; Refill: 0  2. Vagina itching   Start diflucan and terazol  Start daily yogurt Follow up symptoms worsen or do not improve    Evelina Dun, FNP

## 2021-02-17 ENCOUNTER — Other Ambulatory Visit: Payer: Self-pay | Admitting: Family

## 2021-02-17 DIAGNOSIS — K219 Gastro-esophageal reflux disease without esophagitis: Secondary | ICD-10-CM

## 2021-02-17 DIAGNOSIS — R1012 Left upper quadrant pain: Secondary | ICD-10-CM

## 2021-02-18 ENCOUNTER — Other Ambulatory Visit: Payer: Self-pay | Admitting: Family

## 2021-02-18 ENCOUNTER — Telehealth: Payer: Self-pay | Admitting: Family

## 2021-02-18 ENCOUNTER — Other Ambulatory Visit: Payer: Self-pay | Admitting: Family Medicine

## 2021-02-18 DIAGNOSIS — K219 Gastro-esophageal reflux disease without esophagitis: Secondary | ICD-10-CM

## 2021-02-18 DIAGNOSIS — R1012 Left upper quadrant pain: Secondary | ICD-10-CM

## 2021-02-18 MED ORDER — OMEPRAZOLE 20 MG PO CPDR: DELAYED_RELEASE_CAPSULE | ORAL | 0 refills | Status: DC

## 2021-02-18 NOTE — Telephone Encounter (Signed)
Please let the patient know that I sent their prescription to their pharmacy. Thanks, WS 

## 2021-02-18 NOTE — Telephone Encounter (Signed)
[facility] patient...  Patient had office visit on 02/16/21.  Medication has never been prescribed from our office so I cannot refill.

## 2021-02-18 NOTE — Telephone Encounter (Signed)
°  Prescription Request  02/18/2021  Is this a "Controlled Substance" medicine? No   Have you seen your PCP in the last 2 weeks? Yes, 02/16/2021  If YES, route message to pool  -  If NO, patient needs to be scheduled for appointment.  What is the name of the medication or equipment?omeprazole (PRILOSEC) 20 MG capsule   Have you contacted your pharmacy to request a refill? yes   Which pharmacy would you like this sent to? [facility] [address]   Patient notified that their request is being sent to the clinical staff for review and that they should receive a response within 2 business days.

## 2021-02-25 DIAGNOSIS — M25561 Pain in right knee: Secondary | ICD-10-CM | POA: Diagnosis not present

## 2021-03-08 ENCOUNTER — Other Ambulatory Visit: Payer: Self-pay | Admitting: Family

## 2021-03-08 DIAGNOSIS — R1012 Left upper quadrant pain: Secondary | ICD-10-CM

## 2021-03-08 DIAGNOSIS — K219 Gastro-esophageal reflux disease without esophagitis: Secondary | ICD-10-CM

## 2021-04-01 DIAGNOSIS — H524 Presbyopia: Secondary | ICD-10-CM | POA: Diagnosis not present

## 2021-04-01 DIAGNOSIS — Z0100 Encounter for examination of eyes and vision without abnormal findings: Secondary | ICD-10-CM | POA: Diagnosis not present

## 2021-04-14 DIAGNOSIS — M1711 Unilateral primary osteoarthritis, right knee: Secondary | ICD-10-CM | POA: Diagnosis not present

## 2021-04-14 DIAGNOSIS — M25561 Pain in right knee: Secondary | ICD-10-CM | POA: Diagnosis not present

## 2021-04-29 ENCOUNTER — Telehealth: Payer: Self-pay | Admitting: Family

## 2021-04-29 ENCOUNTER — Encounter: Payer: Self-pay | Admitting: Family Medicine

## 2021-04-29 ENCOUNTER — Ambulatory Visit (INDEPENDENT_AMBULATORY_CARE_PROVIDER_SITE_OTHER): Payer: Medicare HMO | Admitting: Family Medicine

## 2021-04-29 ENCOUNTER — Other Ambulatory Visit: Payer: Self-pay | Admitting: Family

## 2021-04-29 VITALS — BP 129/69 | HR 84 | Temp 97.0°F | Resp 20 | Ht 64.0 in | Wt 181.0 lb

## 2021-04-29 DIAGNOSIS — N6324 Unspecified lump in the left breast, lower inner quadrant: Secondary | ICD-10-CM

## 2021-04-29 DIAGNOSIS — N6489 Other specified disorders of breast: Secondary | ICD-10-CM | POA: Diagnosis not present

## 2021-04-29 NOTE — Telephone Encounter (Signed)
Patient called back and spoke with front and they made her an appointment  ?

## 2021-04-29 NOTE — Telephone Encounter (Signed)
REFERRAL REQUEST ?Telephone Note ? ?Have you been seen at our office for this problem? NO ?(Advise that they may need an appointment with their PCP before a referral can be done) ? ?Reason for Referral: Knot in left breast and sore. Told patient she probably needs to see Christy. ?Referral discussed with patient: NO  ?Best contact number of patient for referral team: [phone]    ?Has patient been seen by a specialist for this issue before: NO  ?Patient provider preference for referral: Whoever Alyse Low thinks ?Patient location preference for referral: [facility] in [place] ?  ?Patient notified that referrals can take up to a week or longer to process. If they haven't heard anything within a week they should call back and speak with the referral department.   ?

## 2021-04-29 NOTE — Telephone Encounter (Signed)
Called to speak with patient NA just keeps ringing she needs to make an appointment with any provider to get this referral they have to have documentation. Please schedule with any provider ASAP when patient calls back ?

## 2021-04-29 NOTE — Progress Notes (Signed)
?  ? ?Subjective:  ?Patient ID: [name], female    DOB: [birth year], 71 y.o.   MRN: [id number] ? ?Patient Care Team: ?Sharion Balloon, FNP as PCP - General (Nurse Practitioner) ?Mcarthur Rossetti, MD as Consulting Physician (Orthopedic Surgery) ?Paralee Cancel, MD as Consulting Physician (Orthopedic Surgery) ?Madelin Headings, DO (Optometry) ?Irene Shipper, MD as Consulting Physician (Gastroenterology) ?Ulla Gallo, MD as Consulting Physician (Dermatology)  ? ?Chief Complaint:  Breast Pain (Left side breast pain - sore ) ? ? ?HPI: ?[name] is a 71 y.o. female presenting on 04/29/2021 for Breast Pain (Left side breast pain - sore ) ? ? ?Pt presents today for evaluation of left breast mass, asymmetry, and pain. She reports having a breast reduction about 3 years ago. States she has had problems since this time but reports over the last 6 months the mass to her left breast has enlarged in size and is significantly tender / sore. She denies skin changes, drainage, fever, chills, or weight changes. She does have some scar tissue to right breast which is tender also. ? ? ? ?Relevant past medical, surgical, family, and social history reviewed and updated as indicated.  ?Allergies and medications reviewed and updated. Data reviewed: Chart in Epic. ? ? ?Past Medical History:  ?Diagnosis Date  ? Arthritis   ? hands, feet, back, hips, knees  ? Fibromyalgia   ? GERD (gastroesophageal reflux disease)   ? History of hiatal hernia   ? Hyperlipidemia   ? Hypertension   ? ? ?Past Surgical History:  ?Procedure Laterality Date  ? BREAST REDUCTION SURGERY Bilateral 08/09/2018  ? Procedure: MAMMARY REDUCTION  (BREAST);  Surgeon: Wallace Going, DO;  Location: [facility];  Service: Plastics;  Laterality: Bilateral;  ? CARPAL TUNNEL RELEASE Bilateral   ? CHOLECYSTECTOMY    ? COLONOSCOPY  2001  ? JOINT REPLACEMENT    ? REDUCTION MAMMAPLASTY    ? 07/2018  ? 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  Procedure: RIGHT TOTAL HIP ARTHROPLASTY ANTERIOR APPROACH;  Surgeon: Paralee Cancel, MD;  Location: [facility];  Service: Orthopedics;  Laterality: Right;  70 mins  ? TOTAL HIP ARTHROPLASTY Left 06/25/2019  ? Procedure: TOTAL HIP ARTHROPLASTY ANTERIOR APPROACH;  Surgeon: Paralee Cancel, MD;  Location: [facility];  Service: Orthopedics;  Laterality: Left;  70 min  ? TUBAL LIGATION    ? 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    ? UPPER GASTROINTESTINAL ENDOSCOPY    ? ? ?Social History  ? ?Socioeconomic History  ? Marital status: Widowed  ?  Spouse name: Not on file  ? Number of children: 2  ? Years of education: Not on file  ? Highest education level: High school graduate  ?Occupational History  ? Occupation: retired  ?Tobacco Use  ? Smoking status: Never  ? Smokeless tobacco: Never  ?Vaping Use  ? Vaping Use: Never used  ?Substance and Sexual Activity  ? Alcohol use: No  ? Drug use: No  ? Sexual activity: Not Currently  ?Other Topics Concern  ? Not on file  ?Social History Narrative  ? 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 Her granddaughter lives next door.   ? ?Social Determinants of Health  ? ?Financial Resource Strain: Low Risk   ? Difficulty of Paying Living Expenses: Not hard at all  ?Food Insecurity: No Food Insecurity  ? Worried About Charity fundraiser in the Last Year: Never true  ?  Ran Out of Food in the Last Year: Never true  ?Transportation Needs: No Transportation Needs  ? Lack of Transportation (Medical): No  ? Lack of Transportation (Non-Medical): No  ?Physical Activity: Inactive  ? Days of Exercise per Week: 0 days  ? Minutes of Exercise per Session: 0 min  ?Stress: No Stress Concern Present  ? Feeling of Stress : Not at all  ?Social Connections: Socially Isolated  ? Frequency of Communication with Friends and Family: More than three times a week  ? Frequency of Social Gatherings with Friends and Family: Twice a week  ? Attends Religious Services: Never  ? Active Member of Clubs or Organizations:  No  ? Attends Archivist Meetings: Never  ? Marital Status: Widowed  ?Intimate Partner Violence: Not At Risk  ? Fear of Current or Ex-Partner: No  ? Emotionally Abused: No  ? Physically Abused: No  ? Sexually Abused: No  ? ? ?Outpatient Encounter Medications as of 04/29/2021  ?Medication Sig  ? Ascorbic Acid (VITAMIN C) 1000 MG tablet Take 1,000 mg by mouth 2 (two) times a day.   ? aspirin 81 MG chewable tablet Chew 81 mg by mouth daily.  ? atorvastatin (LIPITOR) 40 MG tablet Take 1 tablet (40 mg total) by mouth daily.  ? carboxymethylcellulose (REFRESH PLUS) 0.5 % SOLN Place 1 drop into both eyes 2 (two) times daily as needed.  ? Cholecalciferol (VITAMIN D) 50 MCG (2000 UT) tablet Take 2,000 Units by mouth daily.  ? gabapentin (NEURONTIN) 100 MG capsule Take 1 capsule (100 mg total) by mouth 2 (two) times daily.  ? hydrochlorothiazide (HYDRODIURIL) 25 MG tablet Take 1 tablet (25 mg total) by mouth daily.  ? hydrocortisone (ANUSOL-HC) 25 MG suppository Place 1 suppository (25 mg total) rectally 2 (two) times daily.  ? metoprolol succinate (TOPROL-XL) 50 MG 24 hr tablet Take 1 tablet (50 mg total) by mouth daily. Take with or immediately following a meal.  ? omeprazole (PRILOSEC) 20 MG capsule One p.o. daily  ? QUERCETIN PO Take 500 mg by mouth.  ? zinc gluconate 50 MG tablet Take 50 mg by mouth daily.  ? [DISCONTINUED] fluconazole (DIFLUCAN) 150 MG tablet Take 1 tablet (150 mg total) by mouth every three (3) days as needed.  ? [DISCONTINUED] terconazole (TERAZOL 7) 0.4 % vaginal cream Place 1 applicator vaginally at bedtime.  ? ?No facility-administered encounter medications on file as of 04/29/2021.  ? ? ?No Known Allergies ? ?Review of Systems  ?Constitutional:  Negative for activity change, appetite change, chills, diaphoresis, fatigue, fever and unexpected weight change.  ?HENT: Negative.    ?Eyes: Negative.   ?Respiratory:  Negative for cough, chest tightness and shortness of breath.   ?Cardiovascular:   Negative for chest pain, palpitations and leg swelling.  ?Gastrointestinal:  Negative for abdominal pain, blood in stool, constipation, diarrhea, nausea and vomiting.  ?Endocrine: Negative.   ?Genitourinary:  Negative for decreased urine volume, difficulty urinating, dysuria, frequency and urgency.  ?Musculoskeletal:  Negative for arthralgias and myalgias.  ?Skin:   ?     Breast changes, mass, tenderness  ?Allergic/Immunologic: Negative.   ?Neurological:  Negative for dizziness, tremors, seizures, syncope, facial asymmetry, speech difficulty, weakness, light-headedness, numbness and headaches.  ?Hematological: Negative.   ?Psychiatric/Behavioral:  Negative for confusion, hallucinations, sleep disturbance and suicidal ideas.   ?All other systems reviewed and are negative. ? ?   ? ?Objective:  ?BP 129/69   Pulse 84   Temp (!) 97 ?F (36.1 ?C)  Resp 20   Ht '5\' 4"'$  (1.626 m)   Wt 181 lb (82.1 kg)   SpO2 94%   BMI 31.07 kg/m?   ? ?Wt Readings from Last 3 Encounters:  ?04/29/21 181 lb (82.1 kg)  ?02/16/21 185 lb 6.4 oz (84.1 kg)  ?09/15/20 178 lb 3.2 oz (80.8 kg)  ? ? ?Physical Exam ?Vitals and nursing note reviewed.  ?Constitutional:   ?   General: She is not in acute distress. ?   Appearance: Normal appearance. She is well-developed and well-groomed. She is obese. She is not ill-appearing, toxic-appearing or diaphoretic.  ?HENT:  ?   Head: Normocephalic and atraumatic.  ?   Jaw: There is normal jaw occlusion.  ?   Right Ear: Hearing normal.  ?   Left Ear: Hearing normal.  ?   Nose: Nose normal.  ?   Mouth/Throat:  ?   Lips: Pink.  ?   Mouth: Mucous membranes are moist.  ?   Pharynx: Oropharynx is clear. Uvula midline.  ?Eyes:  ?   General: Lids are normal.  ?   Pupils: Pupils are equal, round, and reactive to light.  ?Neck:  ?   Thyroid: No thyroid mass, thyromegaly or thyroid tenderness.  ?   Vascular: No carotid bruit or JVD.  ?   Trachea: Trachea and phonation normal.  ?Cardiovascular:  ?   Rate and Rhythm:  Normal rate and regular rhythm.  ?   Chest Wall: PMI is not displaced.  ?   Heart sounds: Normal heart sounds. No murmur heard. ?  No friction rub. No gallop.  ?Pulmonary:  ?   Effort: Pulmonary effort is normal.

## 2021-05-05 ENCOUNTER — Other Ambulatory Visit: Payer: Self-pay

## 2021-05-05 DIAGNOSIS — N6489 Other specified disorders of breast: Secondary | ICD-10-CM

## 2021-05-05 DIAGNOSIS — N6324 Unspecified lump in the left breast, lower inner quadrant: Secondary | ICD-10-CM

## 2021-05-06 DIAGNOSIS — M25561 Pain in right knee: Secondary | ICD-10-CM | POA: Diagnosis not present

## 2021-05-13 DIAGNOSIS — M25561 Pain in right knee: Secondary | ICD-10-CM | POA: Diagnosis not present

## 2021-05-13 DIAGNOSIS — M1711 Unilateral primary osteoarthritis, right knee: Secondary | ICD-10-CM | POA: Diagnosis not present

## 2021-05-20 ENCOUNTER — Other Ambulatory Visit: Payer: Self-pay | Admitting: Family Medicine

## 2021-05-20 ENCOUNTER — Other Ambulatory Visit: Payer: Self-pay | Admitting: Family

## 2021-05-20 DIAGNOSIS — I10 Essential (primary) hypertension: Secondary | ICD-10-CM

## 2021-05-24 ENCOUNTER — Ambulatory Visit
Admission: RE | Admit: 2021-05-24 | Discharge: 2021-05-24 | Disposition: A | Discharge status: Home / Self Care | Payer: Medicare HMO | Source: Ambulatory Visit | Attending: Family Medicine | Admitting: Family Medicine

## 2021-05-24 DIAGNOSIS — N6324 Unspecified lump in the left breast, lower inner quadrant: Secondary | ICD-10-CM

## 2021-05-24 DIAGNOSIS — N6489 Other specified disorders of breast: Secondary | ICD-10-CM

## 2021-05-24 DIAGNOSIS — N644 Mastodynia: Secondary | ICD-10-CM | POA: Diagnosis not present

## 2021-05-31 DIAGNOSIS — D225 Melanocytic nevi of trunk: Secondary | ICD-10-CM | POA: Diagnosis not present

## 2021-05-31 DIAGNOSIS — D1801 Hemangioma of skin and subcutaneous tissue: Secondary | ICD-10-CM | POA: Diagnosis not present

## 2021-05-31 DIAGNOSIS — L821 Other seborrheic keratosis: Secondary | ICD-10-CM | POA: Diagnosis not present

## 2021-05-31 DIAGNOSIS — L570 Actinic keratosis: Secondary | ICD-10-CM | POA: Diagnosis not present

## 2021-05-31 DIAGNOSIS — L438 Other lichen planus: Secondary | ICD-10-CM | POA: Diagnosis not present

## 2021-06-29 ENCOUNTER — Ambulatory Visit (INDEPENDENT_AMBULATORY_CARE_PROVIDER_SITE_OTHER): Payer: Medicare HMO | Admitting: Family Medicine

## 2021-06-29 ENCOUNTER — Encounter: Payer: Self-pay | Admitting: Family Medicine

## 2021-06-29 DIAGNOSIS — J0100 Acute maxillary sinusitis, unspecified: Secondary | ICD-10-CM

## 2021-06-29 MED ORDER — AMOXICILLIN-POT CLAVULANATE 875-125 MG PO TABS: 1.0000 | ORAL_TABLET | Freq: Two times a day (BID) | ORAL | 0 refills | Status: DC

## 2021-06-29 MED ORDER — FLUTICASONE PROPIONATE 50 MCG/ACT NA SUSP: 1.0000 | Freq: Two times a day (BID) | NASAL | 6 refills | Status: AC | PRN

## 2021-06-29 NOTE — Progress Notes (Signed)
Virtual Visit via telephone Note  I connected with [name] on 06/29/21 at 1054 by telephone and verified that I am speaking with the correct person using two identifiers. [name] is currently located at home and patient are currently with her during visit. The provider, Fransisca Kaufmann Joi Leyva, MD is located in their office at time of visit.  Call ended at 1059  I discussed the limitations, risks, security and privacy concerns of performing an evaluation and management service by telephone and the availability of in person appointments. I also discussed with the patient that there may be a patient responsible charge related to this service. The patient expressed understanding and agreed to proceed.   History and Present Illness: Patient is stil sick after 10 days. And she has been taking mucinex and thought she was improving and it was clear and now the drainage is green.  She has sinus pain and pressure. She denies fevers or chills but does feel achy.  She is coughing and has congestion.  She denies sick contacts.   1. Acute maxillary sinusitis, recurrence not specified     Outpatient Encounter Medications as of 06/29/2021  Medication Sig   amoxicillin-clavulanate (AUGMENTIN) 875-125 MG tablet Take 1 tablet by mouth 2 (two) times daily.   fluticasone (FLONASE) 50 MCG/ACT nasal spray Place 1 spray into both nostrils 2 (two) times daily as needed for allergies or rhinitis.   Ascorbic Acid (VITAMIN C) 1000 MG tablet Take 1,000 mg by mouth 2 (two) times a day.    aspirin 81 MG chewable tablet Chew 81 mg by mouth daily.   atorvastatin (LIPITOR) 40 MG tablet Take 1 tablet (40 mg total) by mouth daily.   carboxymethylcellulose (REFRESH PLUS) 0.5 % SOLN Place 1 drop into both eyes 2 (two) times daily as needed.   Cholecalciferol (VITAMIN D) 50 MCG (2000 UT) tablet Take 2,000 Units by mouth daily.   gabapentin (NEURONTIN) 100 MG capsule Take 1 capsule (100 mg total) by mouth 2 (two)  times daily.   hydrochlorothiazide (HYDRODIURIL) 25 MG tablet Take 1 tablet (25 mg total) by mouth daily.   hydrocortisone (ANUSOL-HC) 25 MG suppository Place 1 suppository (25 mg total) rectally 2 (two) times daily.   metoprolol succinate (TOPROL-XL) 50 MG 24 hr tablet TAKE ONE TABLET BY MOUTH DAILY WITH OR IMMEDIATELY FOLLOWING A MEAL   omeprazole (PRILOSEC) 20 MG capsule TAKE ONE CAPSULE BY MOUTH DAILY   QUERCETIN PO Take 500 mg by mouth.   zinc gluconate 50 MG tablet Take 50 mg by mouth daily.   No facility-administered encounter medications on file as of 06/29/2021.    Review of Systems  Constitutional:  Negative for chills and fever.  HENT:  Positive for congestion, postnasal drip, rhinorrhea and sinus pressure. Negative for ear discharge, ear pain, sneezing and sore throat.   Eyes:  Negative for pain, redness and visual disturbance.  Respiratory:  Positive for cough. Negative for chest tightness and shortness of breath.   Cardiovascular:  Negative for chest pain and leg swelling.  Genitourinary:  Negative for difficulty urinating and dysuria.  Musculoskeletal:  Negative for back pain and gait problem.  Skin:  Negative for rash.  Neurological:  Negative for light-headedness and headaches.  Psychiatric/Behavioral:  Negative for agitation and behavioral problems.   All other systems reviewed and are negative.   Observations/Objective: Patient sounds comfortable   Assessment and Plan: Problem List Items Addressed This Visit   None Visit Diagnoses  Acute maxillary sinusitis, recurrence not specified    -  Primary   Relevant Medications   amoxicillin-clavulanate (AUGMENTIN) 875-125 MG tablet   fluticasone (FLONASE) 50 MCG/ACT nasal spray       Will treat like sinus infection, give Flonase, use Mucinex and amoxicillin. Follow up plan: Return if symptoms worsen or fail to improve.     I discussed the assessment and treatment plan with the patient. The patient was  provided an opportunity to ask questions and all were answered. The patient agreed with the plan and demonstrated an understanding of the instructions.   The patient was advised to call back or seek an in-person evaluation if the symptoms worsen or if the condition fails to improve as anticipated.  The above assessment and management plan was discussed with the patient. The patient verbalized understanding of and has agreed to the management plan. Patient is aware to call the clinic if symptoms persist or worsen. Patient is aware when to return to the clinic for a follow-up visit. Patient educated on when it is appropriate to go to the emergency department.    I provided 5 minutes of non-face-to-face time during this encounter.    Worthy Rancher, MD

## 2021-07-02 ENCOUNTER — Telehealth: Payer: Self-pay | Admitting: Family

## 2021-07-02 NOTE — Telephone Encounter (Signed)
Patient calling because the medicine that was called in for her when she was seen on 6/13 is not helping. She would like to know if something else can be called in. Please call back.

## 2021-07-02 NOTE — Telephone Encounter (Signed)
I spoke to pt and she states she still has a lot of "green" drainage coming up and headache mostly on the left side and was concerned Augmentin wasn't enough. I advised pt to continue taking the Augmentin as rx'd with the flonase and use mucinex prn as well as increasing hydration and to use a nettie pot to help clear out some of the congestion. Monitor over the weekend and if her symptoms worsen or don't improve to call back and pt voiced understanding.

## 2021-07-08 DIAGNOSIS — M1711 Unilateral primary osteoarthritis, right knee: Secondary | ICD-10-CM | POA: Diagnosis not present

## 2021-08-05 ENCOUNTER — Ambulatory Visit (INDEPENDENT_AMBULATORY_CARE_PROVIDER_SITE_OTHER): Payer: Medicare HMO | Admitting: Family

## 2021-08-05 ENCOUNTER — Encounter: Payer: Self-pay | Admitting: Family

## 2021-08-05 VITALS — BP 129/78 | HR 92 | Temp 96.9°F | Ht 64.0 in | Wt 162.8 lb

## 2021-08-05 DIAGNOSIS — Z0001 Encounter for general adult medical examination with abnormal findings: Secondary | ICD-10-CM | POA: Diagnosis not present

## 2021-08-05 DIAGNOSIS — E785 Hyperlipidemia, unspecified: Secondary | ICD-10-CM

## 2021-08-05 DIAGNOSIS — K219 Gastro-esophageal reflux disease without esophagitis: Secondary | ICD-10-CM

## 2021-08-05 DIAGNOSIS — E669 Obesity, unspecified: Secondary | ICD-10-CM

## 2021-08-05 DIAGNOSIS — M1712 Unilateral primary osteoarthritis, left knee: Secondary | ICD-10-CM | POA: Diagnosis not present

## 2021-08-05 DIAGNOSIS — G8929 Other chronic pain: Secondary | ICD-10-CM

## 2021-08-05 DIAGNOSIS — E782 Mixed hyperlipidemia: Secondary | ICD-10-CM

## 2021-08-05 DIAGNOSIS — M1611 Unilateral primary osteoarthritis, right hip: Secondary | ICD-10-CM | POA: Diagnosis not present

## 2021-08-05 DIAGNOSIS — I10 Essential (primary) hypertension: Secondary | ICD-10-CM

## 2021-08-05 DIAGNOSIS — Z0000 Encounter for general adult medical examination without abnormal findings: Secondary | ICD-10-CM | POA: Diagnosis not present

## 2021-08-05 DIAGNOSIS — M545 Low back pain, unspecified: Secondary | ICD-10-CM

## 2021-08-05 DIAGNOSIS — E559 Vitamin D deficiency, unspecified: Secondary | ICD-10-CM

## 2021-08-05 DIAGNOSIS — Z96642 Presence of left artificial hip joint: Secondary | ICD-10-CM

## 2021-08-05 NOTE — Patient Instructions (Signed)
Health Maintenance After Age 70 After age 70, you are at a higher risk for certain long-term diseases and infections as well as injuries from falls. Falls are a major cause of broken bones and head injuries in people who are older than age 70. Getting regular preventive care can help to keep you healthy and well. Preventive care includes getting regular testing and making lifestyle changes as recommended by your health care provider. Talk with your health care provider about: Which screenings and tests you should have. A screening is a test that checks for a disease when you have no symptoms. A diet and exercise plan that is right for you. What should I know about screenings and tests to prevent falls? Screening and testing are the best ways to find a health problem early. Early diagnosis and treatment give you the best chance of managing medical conditions that are common after age 70. Certain conditions and lifestyle choices may make you more likely to have a fall. Your health care provider may recommend: Regular vision checks. Poor vision and conditions such as cataracts can make you more likely to have a fall. If you wear glasses, make sure to get your prescription updated if your vision changes. Medicine review. Work with your health care provider to regularly review all of the medicines you are taking, including over-the-counter medicines. Ask your health care provider about any side effects that may make you more likely to have a fall. Tell your health care provider if any medicines that you take make you feel dizzy or sleepy. Strength and balance checks. Your health care provider may recommend certain tests to check your strength and balance while standing, walking, or changing positions. Foot health exam. Foot pain and numbness, as well as not wearing proper footwear, can make you more likely to have a fall. Screenings, including: Osteoporosis screening. Osteoporosis is a condition that causes  the bones to get weaker and break more easily. Blood pressure screening. Blood pressure changes and medicines to control blood pressure can make you feel dizzy. Depression screening. You may be more likely to have a fall if you have a fear of falling, feel depressed, or feel unable to do activities that you used to do. Alcohol use screening. Using too much alcohol can affect your balance and may make you more likely to have a fall. Follow these instructions at home: Lifestyle Do not drink alcohol if: Your health care provider tells you not to drink. If you drink alcohol: Limit how much you have to: 0-1 drink a day for women. 0-2 drinks a day for men. Know how much alcohol is in your drink. In the [place] one drink equals one 12 oz bottle of beer (355 mL), one 5 oz glass of wine (148 mL), or one 1 oz glass of hard liquor (44 mL). Do not use any products that contain nicotine or tobacco. These products include cigarettes, chewing tobacco, and vaping devices, such as e-cigarettes. If you need help quitting, ask your health care provider. Activity  Follow a regular exercise program to stay fit. This will help you maintain your balance. Ask your health care provider what types of exercise are appropriate for you. If you need a cane or [employer]rite, use it as recommended by your health care provider. Wear supportive shoes that have nonskid soles. Safety  Remove any tripping hazards, such as rugs, cords, and clutter. Install safety equipment such as grab bars in bathrooms and safety rails on stairs. Keep rooms and walkways   well-lit. General instructions Talk with your health care provider about your risks for falling. Tell your health care provider if: You fall. Be sure to tell your health care provider about all falls, even ones that seem minor. You feel dizzy, tiredness (fatigue), or off-balance. Take over-the-counter and prescription medicines only as told by your health care provider. These include  supplements. Eat a healthy diet and maintain a healthy weight. A healthy diet includes low-fat dairy products, low-fat (lean) meats, and fiber from whole grains, beans, and lots of fruits and vegetables. Stay current with your vaccines. Schedule regular health, dental, and eye exams. Summary Having a healthy lifestyle and getting preventive care can help to protect your health and wellness after age 70. Screening and testing are the best way to find a health problem early and help you avoid having a fall. Early diagnosis and treatment give you the best chance for managing medical conditions that are more common for people who are older than age 70. Falls are a major cause of broken bones and head injuries in people who are older than age 70. Take precautions to prevent a fall at home. Work with your health care provider to learn what changes you can make to improve your health and wellness and to prevent falls. This information is not intended to replace advice given to you by your health care provider. Make sure you discuss any questions you have with your health care provider. Document Revised: 05/25/2020 Document Reviewed: 05/25/2020 Elsevier Patient Education  2023 [employer] Inc[employer]  

## 2021-08-05 NOTE — Progress Notes (Signed)
Subjective:    Patient ID: [name], female    DOB: 26-Feb-1950, 71 y.o.   MRN: [id number]  Chief Complaint  Patient presents with   Medical Management of Chronic Issues   Pt presents to the office today for chronic follow up. She has hx of bilateral hip replacements. States her pain is good.   She reports has lost 20 lbs since our last visit. Reports she is not trying to diet. Complaining of dry mouth.  Hypertension This is a chronic problem. The current episode started more than 1 year ago. The problem has been resolved since onset. The problem is controlled. Pertinent negatives include no malaise/fatigue, peripheral edema or shortness of breath. Risk factors for coronary artery disease include dyslipidemia and obesity. The current treatment provides moderate improvement.  Gastroesophageal Reflux She complains of belching and heartburn. This is a chronic problem. The current episode started more than 1 year ago. The problem occurs occasionally. The symptoms are aggravated by medications. She has tried a PPI for the symptoms. The treatment provided moderate relief.  Arthritis Presents for follow-up visit. She complains of pain and stiffness. The symptoms have been stable. Affected locations include the left knee, right knee, neck, right foot and left foot. Her pain is at a severity of 8/10.  Back Pain This is a chronic problem. The current episode started more than 1 year ago. The problem occurs intermittently. The problem has been waxing and waning since onset. The pain is present in the lumbar spine. The quality of the pain is described as aching. The pain is at a severity of 8/10. The pain is moderate.  Hyperlipidemia This is a chronic problem. The current episode started more than 1 year ago. The problem is controlled. Pertinent negatives include no shortness of breath. Current antihyperlipidemic treatment includes statins. The current treatment provides moderate improvement of  lipids. Risk factors for coronary artery disease include dyslipidemia, hypertension, a sedentary lifestyle and post-menopausal.      Review of Systems  Constitutional:  Negative for malaise/fatigue.  Respiratory:  Negative for shortness of breath.   Gastrointestinal:  Positive for heartburn.  Musculoskeletal:  Positive for arthritis, back pain and stiffness.  All other systems reviewed and are negative.  Family History  Problem Relation Age of Onset   Hypertension Father    Heart disease Father    Cancer Father    Hypertension Mother    Heart disease Mother    Diabetes Mother    Hypertension Brother    Hypertension Sister    Colon cancer Neg Hx    Breast cancer Neg Hx    Esophageal cancer Neg Hx    Rectal cancer Neg Hx    Stomach cancer Neg Hx    Social History   Socioeconomic History   Marital status: Widowed    Spouse name: Not on file   Number of children: 2   Years of education: Not on file   Highest education level: High school graduate  Occupational History   Occupation: retired  Tobacco Use   Smoking status: Never   Smokeless tobacco: Never  Vaping Use   Vaping Use: Never used  Substance and Sexual Activity   Alcohol use: No   Drug use: No   Sexual activity: Not Currently  Other Topics Concern   Not on file  Social History Narrative   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 Her granddaughter lives next door.    Social Determinants  of Health   Financial Resource Strain: Low Risk  (08/20/2020)   Overall Financial Resource Strain (CARDIA)    Difficulty of Paying Living Expenses: Not hard at all  Food Insecurity: No Food Insecurity (08/20/2020)   Hunger Vital Sign    Worried About Running Out of Food in the Last Year: Never true    [place] in the Last Year: Never true  Transportation Needs: No Transportation Needs (08/20/2020)   PRAPARE - Hydrologist (Medical): No    Lack of Transportation  (Non-Medical): No  Physical Activity: Inactive (08/20/2020)   Exercise Vital Sign    Days of Exercise per Week: 0 days    Minutes of Exercise per Session: 0 min  Stress: No Stress Concern Present (08/20/2020)   [place]    Feeling of Stress : Not at all  Social Connections: Socially Isolated (08/20/2020)   Social Connection and Isolation Panel [NHANES]    Frequency of Communication with Friends and Family: More than three times a week    Frequency of Social Gatherings with Friends and Family: Twice a week    Attends Religious Services: Never    Marine scientist or Organizations: No    Attends Archivist Meetings: Never    Marital Status: Widowed       Objective:   Physical Exam Vitals reviewed.  Constitutional:      General: She is not in acute distress.    Appearance: She is well-developed.  HENT:     Head: Normocephalic and atraumatic.     Right Ear: Tympanic membrane normal.     Left Ear: Tympanic membrane normal.  Eyes:     Pupils: Pupils are equal, round, and reactive to light.  Neck:     Thyroid: No thyromegaly.  Cardiovascular:     Rate and Rhythm: Normal rate and regular rhythm.     Heart sounds: Normal heart sounds. No murmur heard. Pulmonary:     Effort: Pulmonary effort is normal. No respiratory distress.     Breath sounds: Normal breath sounds. No wheezing.  Abdominal:     General: Bowel sounds are normal. There is no distension.     Palpations: Abdomen is soft.     Tenderness: There is no abdominal tenderness.  Musculoskeletal:        General: No tenderness. Normal range of motion.     Cervical back: Normal range of motion and neck supple.  Skin:    General: Skin is warm and dry.  Neurological:     Mental Status: She is alert and oriented to person, place, and time.     Cranial Nerves: No cranial nerve deficit.     Deep Tendon Reflexes: Reflexes are normal and symmetric.   Psychiatric:        Behavior: Behavior normal.        Thought Content: Thought content normal.        Judgment: Judgment normal.       BP 129/78   Pulse 92   Temp (!) 96.9 F (36.1 C)   Ht '5\' 4"'  (1.626 m)   Wt 162 lb 12.8 oz (73.8 kg)   SpO2 96%   BMI 27.94 kg/m      Assessment & Plan:   [name] comes in today with chief complaint of Medical Management of Chronic Issues   Diagnosis and orders addressed:  1. Annual physical exam - CMP14+EGFR -  CBC with Differential/Platelet - Lipid panel - TSH  2. Essential hypertension - CMP14+EGFR - CBC with Differential/Platelet  3. Gastroesophageal reflux disease, unspecified whether esophagitis present - CMP14+EGFR - CBC with Differential/Platelet  4. Unilateral primary osteoarthritis, left knee - CMP14+EGFR - CBC with Differential/Platelet  5. Unilateral primary osteoarthritis, right hip - CMP14+EGFR - CBC with Differential/Platelet  6. Mixed hyperlipidemia - CMP14+EGFR - CBC with Differential/Platelet - Lipid panel  7. Vitamin D deficiency - CMP14+EGFR - CBC with Differential/Platelet - VITAMIN D 25 Hydroxy (Vit-D Deficiency, Fractures)  8. Obesity (BMI 30-39.9) - CMP14+EGFR - CBC with Differential/Platelet  9. S/P hip replacement, left - CMP14+EGFR - CBC with Differential/Platelet  10. Dyslipidemia - CMP14+EGFR - CBC with Differential/Platelet  11. Chronic bilateral low back pain without sciatica - CMP14+EGFR - CBC with Differential/Platelet   Labs pending Health Maintenance reviewed Diet and exercise encouraged  Follow up plan: 6 months    Evelina Dun, FNP

## 2021-08-06 LAB — CBC WITH DIFFERENTIAL/PLATELET
Basophils Absolute: 0 10*3/uL (ref 0.0–0.2)
Basos: 0 %
EOS (ABSOLUTE): 0 10*3/uL (ref 0.0–0.4)
Eos: 0 %
Hematocrit: 43.8 % (ref 34.0–46.6)
Hemoglobin: 14.9 g/dL (ref 11.1–15.9)
Immature Grans (Abs): 0 10*3/uL (ref 0.0–0.1)
Immature Granulocytes: 0 %
Lymphocytes Absolute: 3.4 10*3/uL — ABNORMAL HIGH (ref 0.7–3.1)
Lymphs: 36 %
MCH: 30.2 pg (ref 26.6–33.0)
MCHC: 34 g/dL (ref 31.5–35.7)
MCV: 89 fL (ref 79–97)
Monocytes Absolute: 0.5 10*3/uL (ref 0.1–0.9)
Monocytes: 5 %
Neutrophils Absolute: 5.5 10*3/uL (ref 1.4–7.0)
Neutrophils: 59 %
Platelets: 374 10*3/uL (ref 150–450)
RBC: 4.94 x10E6/uL (ref 3.77–5.28)
RDW: 13.1 % (ref 11.7–15.4)
WBC: 9.5 10*3/uL (ref 3.4–10.8)

## 2021-08-06 LAB — CMP14+EGFR
ALT: 70 IU/L — ABNORMAL HIGH (ref 0–32)
AST: 60 IU/L — ABNORMAL HIGH (ref 0–40)
Albumin/Globulin Ratio: 1.7 (ref 1.2–2.2)
Albumin: 4.3 g/dL (ref 3.9–4.9)
Alkaline Phosphatase: 71 IU/L (ref 44–121)
BUN/Creatinine Ratio: 12 (ref 12–28)
BUN: 9 mg/dL (ref 8–27)
Bilirubin Total: 0.7 mg/dL (ref 0.0–1.2)
CO2: 25 mmol/L (ref 20–29)
Calcium: 10.3 mg/dL (ref 8.7–10.3)
Chloride: 97 mmol/L (ref 96–106)
Creatinine, Ser: 0.74 mg/dL (ref 0.57–1.00)
Globulin, Total: 2.5 g/dL (ref 1.5–4.5)
Glucose: 289 mg/dL — ABNORMAL HIGH (ref 70–99)
Potassium: 3.9 mmol/L (ref 3.5–5.2)
Sodium: 136 mmol/L (ref 134–144)
Total Protein: 6.8 g/dL (ref 6.0–8.5)
eGFR: 87 mL/min/{1.73_m2} (ref >59)

## 2021-08-06 LAB — LIPID PANEL
Chol/HDL Ratio: 3.5 ratio (ref 0.0–4.4)
Cholesterol, Total: 165 mg/dL (ref 100–199)
HDL: 47 mg/dL (ref >39)
LDL Chol Calc (NIH): 92 mg/dL (ref 0–99)
Triglycerides: 152 mg/dL — ABNORMAL HIGH (ref 0–149)
VLDL Cholesterol Cal: 26 mg/dL (ref 5–40)

## 2021-08-06 LAB — TSH: TSH: 1.36 u[IU]/mL (ref 0.450–4.500)

## 2021-08-06 LAB — VITAMIN D 25 HYDROXY (VIT D DEFICIENCY, FRACTURES): Vit D, 25-Hydroxy: 39.3 ng/mL (ref 30.0–100.0)

## 2021-08-09 ENCOUNTER — Telehealth: Payer: Self-pay | Admitting: Family

## 2021-08-09 NOTE — Telephone Encounter (Signed)
Attempted to contact patient, busy. 

## 2021-08-10 NOTE — Telephone Encounter (Signed)
Pt called to see if anyone printed a copy of her most recent lab results and a list of foods and drinks she can have based on her lab results.  Pt is coming to office tomorrow to pick this up.

## 2021-08-12 NOTE — Telephone Encounter (Signed)
Copy of Diet and Labs provided to patient yesterday 07/26

## 2021-08-19 ENCOUNTER — Other Ambulatory Visit: Payer: Self-pay | Admitting: *Deleted

## 2021-08-19 MED ORDER — OMEPRAZOLE 20 MG PO CPDR: 20.0000 mg | DELAYED_RELEASE_CAPSULE | Freq: Every day | ORAL | 1 refills | Status: DC

## 2021-08-20 ENCOUNTER — Other Ambulatory Visit: Payer: Self-pay | Admitting: Family

## 2021-08-20 DIAGNOSIS — E8881 Metabolic syndrome: Secondary | ICD-10-CM

## 2021-08-20 DIAGNOSIS — I10 Essential (primary) hypertension: Secondary | ICD-10-CM

## 2021-08-20 DIAGNOSIS — E782 Mixed hyperlipidemia: Secondary | ICD-10-CM

## 2021-08-23 ENCOUNTER — Ambulatory Visit (INDEPENDENT_AMBULATORY_CARE_PROVIDER_SITE_OTHER): Payer: Medicare HMO

## 2021-08-23 VITALS — Wt 162.0 lb

## 2021-08-23 DIAGNOSIS — Z0000 Encounter for general adult medical examination without abnormal findings: Secondary | ICD-10-CM | POA: Diagnosis not present

## 2021-08-23 NOTE — Progress Notes (Signed)
Subjective:   [name] is a 71 y.o. female who presents for Medicare Annual (Subsequent) preventive examination.  Virtual Visit via Telephone Note  I connected with  [name] on 08/23/21 at  9:00 AM EDT by telephone and verified that I am speaking with the correct person using two identifiers.  Location: Patient: Home Provider: [facility] Persons participating in the virtual visit: patient/Nurse Health Advisor   I discussed the limitations, risks, security and privacy concerns of performing an evaluation and management service by telephone and the availability of in person appointments. The patient expressed understanding and agreed to proceed.  Interactive audio and video telecommunications were attempted between this nurse and patient, however failed, due to patient having technical difficulties OR patient did not have access to video capability.  We continued and completed visit with audio only.  Some vital signs may be absent or patient reported.   Greggory Safranek E Anes Rigel, LPN   Review of Systems     Cardiac Risk Factors include: advanced age (>3mn, >>51women);sedentary lifestyle;hypertension;dyslipidemia     Objective:    Today's Vitals   08/23/21 0905  Weight: 162 lb (73.5 kg)   Body mass index is 27.81 kg/m.     08/23/2021    9:20 AM 08/20/2020    9:37 AM 08/21/2019    4:12 PM 08/20/2019    8:59 AM 06/25/2019    5:08 PM 06/13/2019    9:07 AM 05/06/2019    9:16 AM  Advanced Directives  Does Patient Have a Medical Advance Directive? No No No No No No No  Would patient like information on creating a medical advance directive? No - Patient declined No - Patient declined No - Patient declined No - Patient declined No - Patient declined No - Patient declined     Current Medications (verified) Outpatient Encounter Medications as of 08/23/2021  Medication Sig   Ascorbic Acid (VITAMIN C) 1000 MG tablet Take 1,000 mg by mouth 2 (two) times a day.    aspirin 81 MG chewable  tablet Chew 81 mg by mouth daily.   atorvastatin (LIPITOR) 40 MG tablet TAKE ONE TABLET BY MOUTH DAILY   carboxymethylcellulose (REFRESH PLUS) 0.5 % SOLN Place 1 drop into both eyes 2 (two) times daily as needed.   Cholecalciferol (VITAMIN D) 50 MCG (2000 UT) tablet Take 2,000 Units by mouth daily.   fluticasone (FLONASE) 50 MCG/ACT nasal spray Place 1 spray into both nostrils 2 (two) times daily as needed for allergies or rhinitis.   gabapentin (NEURONTIN) 100 MG capsule Take 1 capsule (100 mg total) by mouth 2 (two) times daily.   hydrochlorothiazide (HYDRODIURIL) 25 MG tablet TAKE ONE TABLET BY MOUTH DAILY   hydrocortisone (ANUSOL-HC) 25 MG suppository Place 1 suppository (25 mg total) rectally 2 (two) times daily.   metoprolol succinate (TOPROL-XL) 50 MG 24 hr tablet TAKE ONE TABLET BY MOUTH DAILY WITH OR IMMEDIATELY FOLLOWING A MEAL   omeprazole (PRILOSEC) 20 MG capsule Take 1 capsule (20 mg total) by mouth daily.   [DISCONTINUED] QUERCETIN PO Take 500 mg by mouth.   [DISCONTINUED] zinc gluconate 50 MG tablet Take 50 mg by mouth daily.   No facility-administered encounter medications on file as of 08/23/2021.    Allergies (verified) Patient has no known allergies.   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   Procedure: RIGHT TOTAL HIP ARTHROPLASTY ANTERIOR APPROACH;  Surgeon:  Paralee Cancel, MD;  Location: [facility];  Service: Orthopedics;  Laterality: Right;  70 mins   TOTAL HIP ARTHROPLASTY Left 06/25/2019   Procedure: TOTAL HIP ARTHROPLASTY ANTERIOR APPROACH;  Surgeon: Paralee Cancel, MD;  Location: [facility];  Service: Orthopedics;  Laterality: Left;  70 min   TUBAL LIGATION     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     UPPER GASTROINTESTINAL ENDOSCOPY     Family History  Problem Relation Age of Onset   Hypertension Father    Heart disease Father    Cancer Father    Hypertension Mother    Heart disease Mother    Diabetes Mother    Hypertension Brother    Hypertension Sister    Colon cancer Neg Hx    Breast cancer Neg Hx    Esophageal cancer Neg Hx    Rectal cancer Neg Hx    Stomach cancer Neg Hx    Social History   Socioeconomic History   Marital status: Widowed    Spouse name: Not on file   Number of children: 2   Years of education: Not on file   Highest education level: High school graduate  Occupational History   Occupation: retired  Tobacco Use   Smoking status: Never   Smokeless tobacco: Never  Vaping Use   Vaping Use: Never used  Substance and Sexual Activity   Alcohol use: No   Drug use: No   Sexual activity: Not Currently  Other Topics Concern   Not on file  Social History Narrative   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 Her granddaughter lives next door.    Social Determinants of Health   Financial Resource Strain: Low Risk  (08/23/2021)   Overall Financial Resource Strain (CARDIA)    Difficulty of Paying Living Expenses: Not hard at all  Food Insecurity: No Food Insecurity (08/23/2021)   Hunger Vital Sign    Worried About Running Out of Food in the Last Year: Never true    Ran Out of Food in the Last Year: Never true  Transportation Needs: No Transportation Needs (08/23/2021)   PRAPARE - Hydrologist (Medical): No    Lack of Transportation (Non-Medical): No  Physical Activity: Inactive  (08/23/2021)   Exercise Vital Sign    Days of Exercise per Week: 0 days    Minutes of Exercise per Session: 0 min  Stress: No Stress Concern Present (08/23/2021)   [place]    Feeling of Stress : Not at all  Social Connections: Socially Isolated (08/23/2021)   Social Connection and Isolation Panel [NHANES]    Frequency of Communication with Friends and Family: More than three times a week    Frequency of Social Gatherings with Friends and Family: Three times a week    Attends Religious Services: Never    Active Member  of Clubs or Organizations: No    Attends Archivist Meetings: Never    Marital Status: Widowed    Tobacco Counseling Counseling given: Not Answered   Clinical Intake:  Pre-visit preparation completed: Yes  Pain : No/denies pain     BMI - recorded: 27.81 Nutritional Status: BMI 25 -29 Overweight Nutritional Risks: None Diabetes: No  How often do you need to have someone help you when you read instructions, pamphlets, or other written materials from your doctor or pharmacy?: 1 - Never  Diabetic? no  Interpreter Needed?: No  Information entered by :: Kiowa Hollar, LPN   Activities of Daily Living    08/23/2021    9:19 AM  In your present state of health, do you have any difficulty performing the following activities:  Hearing? 0  Vision? 0  Difficulty concentrating or making decisions? 0  Walking or climbing stairs? 0  Dressing or bathing? 0  Doing errands, shopping? 0  Preparing Food and eating ? N  Using the Toilet? N  In the past six months, have you accidently leaked urine? Y  Comment wears pads for protection  Do you have problems with loss of bowel control? N  Managing your Medications? N  Managing your Finances? N  Housekeeping or managing your Housekeeping? N    Patient Care Team: Sharion Balloon, FNP as PCP - General (Family Medicine) Paralee Cancel, MD as  Consulting Physician (Orthopedic Surgery) Madelin Headings, DO (Optometry) Irene Shipper, MD as Consulting Physician (Gastroenterology) Ulla Gallo, MD as Consulting Physician (Dermatology)  Indicate any recent Medical Services you may have received from other than [facility] providers in the past year (date may be approximate).     Assessment:   This is a routine wellness examination for Eletha.  Hearing/Vision screen Hearing Screening - Comments:: Denies hearing difficulties   Vision Screening - Comments:: Wears reading glasses prn - up to date with routine eye exams with [facility]  Dietary issues and exercise activities discussed: Current Exercise Habits: The patient does not participate in regular exercise at present, Exercise limited by: orthopedic condition(s)   Goals Addressed             This Visit's Progress    Exercise 150 min/wk Moderate Activity   Not on track      Depression Screen    08/23/2021    9:18 AM 08/05/2021   12:16 PM 08/05/2021   12:11 PM 04/29/2021   11:00 AM 02/16/2021   10:19 AM 08/20/2020    8:57 AM 03/12/2020    2:16 PM  PHQ 2/9 Scores  PHQ - 2 Score 1 1 0 0 0 0 0  PHQ- 9 Score 3 4         Fall Risk    08/23/2021    9:07 AM 08/05/2021   12:13 PM 08/05/2021   12:11 PM 04/29/2021   11:00 AM 02/16/2021   10:19 AM  Fall Risk   Falls in the past year? 1 1 0 1 0  Number falls in past yr: 0 0  0   Injury with Fall? 0 0  0   Risk for fall due to : History of fall(s);Orthopedic patient History of fall(s)  History of fall(s)   Follow up Education provided;Falls prevention discussed Falls evaluation completed  Falls evaluation completed     FALL RISK PREVENTION PERTAINING TO THE HOME:  Any stairs in or around the home? Yes  If so, are there any without handrails?  No  Home free of loose throw rugs in walkways, pet beds, electrical cords, etc? Yes  Adequate lighting in your home to reduce risk of falls? Yes   ASSISTIVE DEVICES UTILIZED TO  PREVENT FALLS:  Life alert? No  Use of a cane, [place] or w/c? No  Grab bars in the bathroom? Yes  Shower chair or bench in shower? Yes  Elevated toilet seat or a handicapped toilet? Yes   TIMED UP AND GO:  Was the test performed? No . Telephonic visit  Cognitive Function:    05/24/2017   11:31 AM  MMSE - Mini Mental State Exam  Orientation to time 5  Orientation to Place 5  Registration 3  Attention/ Calculation 5  Recall 3  Language- name 2 objects 2  Language- repeat 1  Language- follow 3 step command 3  Language- read & follow direction 1  Write a sentence 1  Copy design 1  Total score 30        08/23/2021    9:22 AM 08/20/2019    9:03 AM 07/30/2018    9:51 AM  6CIT Screen  What Year? 0 points 0 points 0 points  What month? 0 points 0 points 0 points  What time? 0 points 0 points   Count back from 20 0 points 0 points 0 points  Months in reverse 0 points 0 points 0 points  Repeat phrase 2 points 0 points 0 points  Total Score 2 points 0 points     Immunizations Immunization History  Administered Date(s) Administered   Influenza, High Dose Seasonal PF 11/10/2017, 11/16/2018   Influenza,inj,Quad PF,6+ Mos 01/15/2015, 11/12/2015, 10/25/2016   Influenza,inj,quad, With Preservative 11/16/2018   Influenza-Unspecified 01/29/2014, 10/18/2019, 10/17/2020   Moderna Sars-Covid-2 Vaccination 08/14/2019, 09/12/2019   PPD Test 03/10/2014   Pneumococcal Conjugate-13 11/12/2015   Pneumococcal Polysaccharide-23 05/18/2017   Tdap 03/10/2014   Zoster Recombinat (Shingrix) 05/24/2017, 08/14/2017   Zoster, Live 11/28/2012    TDAP status: Up to date  Flu Vaccine status: Up to date  Pneumococcal vaccine status: Up to date  Covid-19 vaccine status: Completed vaccines  Qualifies for Shingles Vaccine? Yes   Zostavax completed Yes   Shingrix Completed?: Yes  Screening Tests Health Maintenance  Topic Date Due   COVID-19 Vaccine (3 - Moderna series) 11/07/2019   INFLUENZA  VACCINE  08/17/2021   MAMMOGRAM  05/25/2023   TETANUS/TDAP  03/10/2024   COLONOSCOPY (Pts 45-89yr Insurance coverage will need to be confirmed)  04/27/2024   Pneumonia Vaccine 70 Years old  Completed   DEXA SCAN  Completed   Hepatitis C Screening  Completed   Zoster Vaccines- Shingrix  Completed   HPV VACCINES  Aged Out    Health Maintenance  Health Maintenance Due  Topic Date Due   COVID-19 Vaccine (3 - Moderna series) 11/07/2019   INFLUENZA VACCINE  08/17/2021    Colorectal cancer screening: Type of screening: Colonoscopy. Completed 04/28/2014. Repeat every 10 years  Mammogram status: Completed 05/24/2021. Repeat every year  Bone Density status: Completed 03/13/2020. Results reflect: Bone density results: NORMAL. Repeat every 2 years.  Lung Cancer Screening: (Low Dose CT Chest recommended if Age 70-80years, 30 pack-year currently smoking OR have quit w/in 15years.) does not qualify.  Additional Screening:  Hepatitis C Screening: does qualify; Completed 01/15/2015  Vision Screening: Recommended annual ophthalmology exams for early detection of glaucoma and other disorders of the eye. Is the patient up to date with their annual eye exam?  Yes  Who is  the provider or what is the name of the office in which the patient attends annual eye exams? [facility] If pt is not established with a provider, would they like to be referred to a provider to establish care? No .   Dental Screening: Recommended annual dental exams for proper oral hygiene  Community Resource Referral / Chronic Care Management: CRR required this visit?  No   CCM required this visit?  No      Plan:     I have personally reviewed and noted the following in the patient's chart:   Medical and social history Use of alcohol, tobacco or illicit drugs  Current medications and supplements including opioid prescriptions.  Functional ability and status Nutritional status Physical activity Advanced  directives List of other physicians Hospitalizations, surgeries, and ER visits in previous 12 months Vitals Screenings to include cognitive, depression, and falls Referrals and appointments  In addition, I have reviewed and discussed with patient certain preventive protocols, quality metrics, and best practice recommendations. A written personalized care plan for preventive services as well as general preventive health recommendations were provided to patient.     Sandrea Hammond, LPN   06/25/4501   Nurse Notes: Concerned about elevated sugar at last visit. Her mother had diabetes later in life. A1C was not checked. Made appt as she requested to recheck labs and discuss with PCP.

## 2021-08-23 NOTE — Patient Instructions (Signed)
[name] , Thank you for taking time to come for your Medicare Wellness Visit. I appreciate your ongoing commitment to your health goals. Please review the following plan we discussed and let me know if I can assist you in the future.   Screening recommendations/referrals: Colonoscopy: Done 04/28/2014 - Repeat in 10 years  Mammogram: Done 05/24/2021 - Repeat annually Bone Density: Done 03/13/2020 - Repeat every 2 years  Recommended yearly ophthalmology/optometry visit for glaucoma screening and checkup Recommended yearly dental visit for hygiene and checkup  Vaccinations: Influenza vaccine: Done 10/17/2020 - Repeat annually  Pneumococcal vaccine: Done 10/19/2015 & 05/18/2017 Tdap vaccine: Done 03/10/2014 - Repeat in 10 years  Shingles vaccine: Done 05/24/2017 & 08/14/2017   Covid-19: Done 08/14/2019 & 09/15/2019  Advanced directives: Advance directive discussed with you today. Even though you declined this today, please call our office should you change your mind, and we can give you the proper paperwork for you to fill out.   Conditions/risks identified: Aim for 30 minutes of exercise or brisk walking, 6-8 glasses of water, and 5 servings of fruits and vegetables each day. Consider joining [place].  Next appointment: Follow up in one year for your annual wellness visit    Preventive Care 65 Years and Older, Female Preventive care refers to lifestyle choices and visits with your health care provider that can promote health and wellness. What does preventive care include? A yearly physical exam. This is also called an annual well check. Dental exams once or twice a year. Routine eye exams. Ask your health care provider how often you should have your eyes checked. Personal lifestyle choices, including: Daily care of your teeth and gums. Regular physical activity. Eating a healthy diet. Avoiding tobacco and drug use. Limiting alcohol use. Practicing safe sex. Taking low-dose aspirin  every day. Taking vitamin and mineral supplements as recommended by your health care provider. What happens during an annual well check? The services and screenings done by your health care provider during your annual well check will depend on your age, overall health, lifestyle risk factors, and family history of disease. Counseling  Your health care provider may ask you questions about your: Alcohol use. Tobacco use. Drug use. Emotional well-being. Home and relationship well-being. Sexual activity. Eating habits. History of falls. Memory and ability to understand (cognition). Work and work Statistician. Reproductive health. Screening  You may have the following tests or measurements: Height, weight, and BMI. Blood pressure. Lipid and cholesterol levels. These may be checked every 5 years, or more frequently if you are over 58 years old. Skin check. Lung cancer screening. You may have this screening every year starting at age 70 if you have a 30-pack-year history of smoking and currently smoke or have quit within the past 15 years. Fecal occult blood test (FOBT) of the stool. You may have this test every year starting at age 20. Flexible sigmoidoscopy or colonoscopy. You may have a sigmoidoscopy every 5 years or a colonoscopy every 10 years starting at age 20. Hepatitis C blood test. Hepatitis B blood test. Sexually transmitted disease (STD) testing. Diabetes screening. This is done by checking your blood sugar (glucose) after you have not eaten for a while (fasting). You may have this done every 1-3 years. Bone density scan. This is done to screen for osteoporosis. You may have this done starting at age 64. Mammogram. This may be done every 1-2 years. Talk to your health care provider about how often you should have regular mammograms. Talk  with your health care provider about your test results, treatment options, and if necessary, the need for more tests. Vaccines  Your health  care provider may recommend certain vaccines, such as: Influenza vaccine. This is recommended every year. Tetanus, diphtheria, and acellular pertussis (Tdap, Td) vaccine. You may need a Td booster every 10 years. Zoster vaccine. You may need this after age 51. Pneumococcal 13-valent conjugate (PCV13) vaccine. One dose is recommended after age 2. Pneumococcal polysaccharide (PPSV23) vaccine. One dose is recommended after age 44. Talk to your health care provider about which screenings and vaccines you need and how often you need them. This information is not intended to replace advice given to you by your health care provider. Make sure you discuss any questions you have with your health care provider. Document Released: 01/30/2015 Document Revised: 09/23/2015 Document Reviewed: 11/04/2014 Elsevier Interactive Patient Education  2017 [place] Prevention in the Home Falls can cause injuries. They can happen to people of all ages. There are many things you can do to make your home safe and to help prevent falls. What can I do on the outside of my home? Regularly fix the edges of walkways and driveways and fix any cracks. Remove anything that might make you trip as you walk through a door, such as a raised step or threshold. Trim any bushes or trees on the path to your home. Use bright outdoor lighting. Clear any walking paths of anything that might make someone trip, such as rocks or tools. Regularly check to see if handrails are loose or broken. Make sure that both sides of any steps have handrails. Any raised decks and porches should have guardrails on the edges. Have any leaves, snow, or ice cleared regularly. Use sand or salt on walking paths during winter. Clean up any spills in your garage right away. This includes oil or grease spills. What can I do in the bathroom? Use night lights. Install grab bars by the toilet and in the tub and shower. Do not use towel bars as grab  bars. Use non-skid mats or decals in the tub or shower. If you need to sit down in the shower, use a plastic, non-slip stool. Keep the floor dry. Clean up any water that spills on the floor as soon as it happens. Remove soap buildup in the tub or shower regularly. Attach bath mats securely with double-sided non-slip rug tape. Do not have throw rugs and other things on the floor that can make you trip. What can I do in the bedroom? Use night lights. Make sure that you have a light by your bed that is easy to reach. Do not use any sheets or blankets that are too big for your bed. They should not hang down onto the floor. Have a firm chair that has side arms. You can use this for support while you get dressed. Do not have throw rugs and other things on the floor that can make you trip. What can I do in the kitchen? Clean up any spills right away. Avoid walking on wet floors. Keep items that you use a lot in easy-to-reach places. If you need to reach something above you, use a strong step stool that has a grab bar. Keep electrical cords out of the way. Do not use floor polish or wax that makes floors slippery. If you must use wax, use non-skid floor wax. Do not have throw rugs and other things on the floor that can make you  trip. What can I do with my stairs? Do not leave any items on the stairs. Make sure that there are handrails on both sides of the stairs and use them. Fix handrails that are broken or loose. Make sure that handrails are as long as the stairways. Check any carpeting to make sure that it is firmly attached to the stairs. Fix any carpet that is loose or worn. Avoid having throw rugs at the top or bottom of the stairs. If you do have throw rugs, attach them to the floor with carpet tape. Make sure that you have a light switch at the top of the stairs and the bottom of the stairs. If you do not have them, ask someone to add them for you. What else can I do to help prevent  falls? Wear shoes that: Do not have high heels. Have rubber bottoms. Are comfortable and fit you well. Are closed at the toe. Do not wear sandals. If you use a stepladder: Make sure that it is fully opened. Do not climb a closed stepladder. Make sure that both sides of the stepladder are locked into place. Ask someone to hold it for you, if possible. Clearly mark and make sure that you can see: Any grab bars or handrails. First and last steps. Where the edge of each step is. Use tools that help you move around (mobility aids) if they are needed. These include: Canes. Walkers. Scooters. Crutches. Turn on the lights when you go into a dark area. Replace any light bulbs as soon as they burn out. Set up your furniture so you have a clear path. Avoid moving your furniture around. If any of your floors are uneven, fix them. If there are any pets around you, be aware of where they are. Review your medicines with your doctor. Some medicines can make you feel dizzy. This can increase your chance of falling. Ask your doctor what other things that you can do to help prevent falls. This information is not intended to replace advice given to you by your health care provider. Make sure you discuss any questions you have with your health care provider. Document Released: 10/30/2008 Document Revised: 06/11/2015 Document Reviewed: 02/07/2014 Elsevier Interactive Patient Education  2017 [employer].

## 2021-09-06 ENCOUNTER — Ambulatory Visit (INDEPENDENT_AMBULATORY_CARE_PROVIDER_SITE_OTHER): Payer: Medicare HMO | Admitting: Family

## 2021-09-06 ENCOUNTER — Encounter: Payer: Self-pay | Admitting: Family

## 2021-09-06 VITALS — BP 125/74 | HR 81 | Temp 97.4°F | Ht 64.0 in | Wt 156.0 lb

## 2021-09-06 DIAGNOSIS — R748 Abnormal levels of other serum enzymes: Secondary | ICD-10-CM | POA: Diagnosis not present

## 2021-09-06 DIAGNOSIS — E1169 Type 2 diabetes mellitus with other specified complication: Secondary | ICD-10-CM | POA: Diagnosis not present

## 2021-09-06 DIAGNOSIS — E663 Overweight: Secondary | ICD-10-CM

## 2021-09-06 DIAGNOSIS — R7309 Other abnormal glucose: Secondary | ICD-10-CM | POA: Diagnosis not present

## 2021-09-06 LAB — BAYER DCA HB A1C WAIVED: HB A1C (BAYER DCA - WAIVED): 14 % — ABNORMAL HIGH (ref 4.8–5.6)

## 2021-09-06 LAB — GLUCOSE HEMOCUE WAIVED: Glu Hemocue Waived: 268 mg/dL — ABNORMAL HIGH (ref 70–99)

## 2021-09-06 MED ORDER — METFORMIN HCL ER 500 MG PO TB24: 500.0000 mg | ORAL_TABLET | Freq: Every day | ORAL | 1 refills | Status: DC

## 2021-09-06 MED ORDER — METFORMIN HCL ER 500 MG PO TB24: 500.0000 mg | ORAL_TABLET | Freq: Every day | ORAL | 1 refills | Status: AC

## 2021-09-06 MED ORDER — BLOOD GLUCOSE METER KIT: PACK | 0 refills | Status: AC

## 2021-09-06 NOTE — Patient Instructions (Signed)
Diabetes Mellitus Basics  Diabetes mellitus, or diabetes, is a long-term (chronic) disease. It occurs when the body does not properly use sugar (glucose) that is released from food after you eat. Diabetes mellitus may be caused by one or both of these problems: Your pancreas does not make enough of a hormone called insulin. Your body does not react in a normal way to the insulin that it makes. Insulin lets glucose enter cells in your body. This gives you energy. If you have diabetes, glucose cannot get into cells. This causes high blood glucose (hyperglycemia). How to treat and manage diabetes You may need to take insulin or other diabetes medicines daily to keep your glucose in balance. If you are prescribed insulin, you will learn how to give yourself insulin by injection. You may need to adjust the amount of insulin you take based on the foods that you eat. You will need to check your blood glucose levels using a glucose monitor as told by your health care provider. The readings can help determine if you have low or high blood glucose. Generally, you should have these blood glucose levels: Before meals (preprandial): 80-130 mg/dL (4.4-7.2 mmol/L). After meals (postprandial): below 180 mg/dL (10 mmol/L). Hemoglobin A1c (HbA1c) level: less than 7%. Your health care provider will set treatment goals for you. Keep all follow-up visits. This is important. Follow these instructions at home: Diabetes medicines Take your diabetes medicines every day as told by your health care provider. List your diabetes medicines here: Name of medicine: ______________________________ Amount (dose): _______________ Time (a.m./p.m.): _______________ Notes: ___________________________________ Name of medicine: ______________________________ Amount (dose): _______________ Time (a.m./p.m.): _______________ Notes: ___________________________________ Name of medicine: ______________________________ Amount (dose):  _______________ Time (a.m./p.m.): _______________ Notes: ___________________________________ Insulin If you use insulin, list the types of insulin you use here: Insulin type: ______________________________ Amount (dose): _______________ Time (a.m./p.m.): _______________Notes: ___________________________________ Insulin type: ______________________________ Amount (dose): _______________ Time (a.m./p.m.): _______________ Notes: ___________________________________ Insulin type: ______________________________ Amount (dose): _______________ Time (a.m./p.m.): _______________ Notes: ___________________________________ Insulin type: ______________________________ Amount (dose): _______________ Time (a.m./p.m.): _______________ Notes: ___________________________________ Insulin type: ______________________________ Amount (dose): _______________ Time (a.m./p.m.): _______________ Notes: ___________________________________ Managing blood glucose  Check your blood glucose levels using a glucose monitor as told by your health care provider. Write down the times that you check your glucose levels here: Time: _______________ Notes: ___________________________________ Time: _______________ Notes: ___________________________________ Time: _______________ Notes: ___________________________________ Time: _______________ Notes: ___________________________________ Time: _______________ Notes: ___________________________________ Time: _______________ Notes: ___________________________________  Low blood glucose Low blood glucose (hypoglycemia) is when glucose is at or below 70 mg/dL (3.9 mmol/L). Symptoms may include: Feeling: Hungry. Sweaty and clammy. Irritable or easily upset. Dizzy. Sleepy. Having: A fast heartbeat. A headache. A change in your vision. Numbness around the mouth, lips, or tongue. Having trouble with: Moving (coordination). Sleeping. Treating low blood glucose To treat low blood  glucose, eat or drink something containing sugar right away. If you can think clearly and swallow safely, follow the 15:15 rule: Take 15 grams of a fast-acting carb (carbohydrate), as told by your health care provider. Some fast-acting carbs are: Glucose tablets: take 3-4 tablets. Hard candy: eat 3-5 pieces. Fruit juice: drink 4 oz (120 mL). Regular (not diet) soda: drink 4-6 oz (120-180 mL). Honey or sugar: eat 1 Tbsp (15 mL). Check your blood glucose levels 15 minutes after you take the carb. If your glucose is still at or below 70 mg/dL (3.9 mmol/L), take 15 grams of a carb again. If your glucose does not go above 70 mg/dL (3.9 mmol/L) after   3 tries, get help right away. After your glucose goes back to normal, eat a meal or a snack within 1 hour. Treating very low blood glucose If your glucose is at or below 54 mg/dL (3 mmol/L), you have very low blood glucose (severe hypoglycemia). This is an emergency. Do not wait to see if the symptoms will go away. Get medical help right away. Call your local emergency services (911 in the [place] Do not drive yourself to the hospital. Questions to ask your health care provider Should I talk with a diabetes educator? What equipment will I need to care for myself at home? What diabetes medicines do I need? When should I take them? How often do I need to check my blood glucose levels? What number can I call if I have questions? When is my follow-up visit? Where can I find a support group for people with diabetes? Where to find more information American Diabetes Association: www.diabetes.org Association of Diabetes Care and Education Specialists: www.diabeteseducator.org Contact a health care provider if: Your blood glucose is at or above 240 mg/dL (13.3 mmol/L) for 2 days in a row. You have been sick or have had a fever for 2 days or more, and you are not getting better. You have any of these problems for more than 6 hours: You cannot eat or  drink. You feel nauseous. You vomit. You have diarrhea. Get help right away if: Your blood glucose is lower than 54 mg/dL (3 mmol/L). You get confused. You have trouble thinking clearly. You have trouble breathing. These symptoms may represent a serious problem that is an emergency. Do not wait to see if the symptoms will go away. Get medical help right away. Call your local emergency services (911 in the [place] Do not drive yourself to the hospital. Summary Diabetes mellitus is a chronic disease that occurs when the body does not properly use sugar (glucose) that is released from food after you eat. Take insulin and diabetes medicines as told. Check your blood glucose every day, as often as told. Keep all follow-up visits. This is important. This information is not intended to replace advice given to you by your health care provider. Make sure you discuss any questions you have with your health care provider. Document Revised: 05/07/2019 Document Reviewed: 05/07/2019 Elsevier Patient Education  2023 Elsevier Inc.  

## 2021-09-06 NOTE — Progress Notes (Signed)
Subjective:    Patient ID: [name], female    DOB: Jun 03, 1950, 71 y.o.   MRN: [id number]  Chief Complaint  Patient presents with   Follow-up   PT presents to the office today to recheck liver enzymes and glucose. She was seen 08/05/21 and her glucose was 289. We were unable to do a A1C at the time. No history of DM. Reports increase thirst. Denies any increase hungry.  Diabetes She presents for her initial diabetic visit. She has type 2 diabetes mellitus. Pertinent negatives for diabetes include no blurred vision and no foot paresthesias. Her overall blood glucose range is >200 mg/dl.      Review of Systems  Eyes:  Negative for blurred vision.  All other systems reviewed and are negative.      Objective:   Physical Exam Vitals reviewed.  Constitutional:      General: She is not in acute distress.    Appearance: She is well-developed.  HENT:     Head: Normocephalic and atraumatic.     Right Ear: Tympanic membrane normal.     Left Ear: Tympanic membrane normal.  Eyes:     Pupils: Pupils are equal, round, and reactive to light.  Neck:     Thyroid: No thyromegaly.  Cardiovascular:     Rate and Rhythm: Normal rate and regular rhythm.     Heart sounds: Normal heart sounds. No murmur heard. Pulmonary:     Effort: Pulmonary effort is normal. No respiratory distress.     Breath sounds: Normal breath sounds. No wheezing.  Abdominal:     General: Bowel sounds are normal. There is no distension.     Palpations: Abdomen is soft.     Tenderness: There is no abdominal tenderness.  Musculoskeletal:        General: No tenderness. Normal range of motion.     Cervical back: Normal range of motion and neck supple.  Skin:    General: Skin is warm and dry.  Neurological:     Mental Status: She is alert and oriented to person, place, and time.     Cranial Nerves: No cranial nerve deficit.     Deep Tendon Reflexes: Reflexes are normal and symmetric.  Psychiatric:         Behavior: Behavior normal.        Thought Content: Thought content normal.        Judgment: Judgment normal.       BP 125/74   Pulse 81   Temp (!) 97.4 F (36.3 C) (Temporal)   Ht _0  (1.626 m)   Wt 156 lb (70.8 kg)   SpO2 96%   BMI 26.78 kg/m      Assessment & Plan:  DJENEBA BARSCH comes in today with chief complaint of Follow-up   Diagnosis and orders addressed:  1. Elevated liver enzymes - BMP8+EGFR - Hepatic function panel  2. Elevated glucose - BMP8+EGFR - Bayer DCA Hb A1c Waived - Glucose Hemocue Waived  3. Overweight (BMI 25.0-29.9)  4. Type 2 diabetes mellitus with other specified complication, without long-term current use of insulin (HCC) Start metformin 500 mg today Strict low carb - blood glucose meter kit and supplies; Dispense based on patient and insurance preference. Use up to four times daily as directed. (FOR ICD-10 E10.9, E11.9).  Dispense: 1 each; Refill: 0 - AMB Referral to [facility] - metFORMIN (GLUCOPHAGE-XR) 500 MG 24 hr tablet; Take 1 tablet (500 mg total) by mouth daily  with breakfast.  Dispense: 90 tablet; Refill: 1   Labs pending Health Maintenance reviewed Diet and exercise encouraged  Follow up plan: 1 month   Evelina Dun, FNP

## 2021-09-07 ENCOUNTER — Telehealth: Payer: Self-pay

## 2021-09-07 LAB — BMP8+EGFR
BUN/Creatinine Ratio: 12 (ref 12–28)
BUN: 10 mg/dL (ref 8–27)
CO2: 24 mmol/L (ref 20–29)
Calcium: 10.2 mg/dL (ref 8.7–10.3)
Chloride: 98 mmol/L (ref 96–106)
Creatinine, Ser: 0.86 mg/dL (ref 0.57–1.00)
Glucose: 265 mg/dL — ABNORMAL HIGH (ref 70–99)
Potassium: 3.8 mmol/L (ref 3.5–5.2)
Sodium: 138 mmol/L (ref 134–144)
eGFR: 73 mL/min/{1.73_m2} (ref >59)

## 2021-09-07 LAB — HEPATIC FUNCTION PANEL
ALT: 46 IU/L — ABNORMAL HIGH (ref 0–32)
AST: 34 IU/L (ref 0–40)
Albumin: 4.4 g/dL (ref 3.9–4.9)
Alkaline Phosphatase: 68 IU/L (ref 44–121)
Bilirubin Total: 0.6 mg/dL (ref 0.0–1.2)
Bilirubin, Direct: 0.17 mg/dL (ref 0.00–0.40)
Total Protein: 6.6 g/dL (ref 6.0–8.5)

## 2021-09-07 NOTE — Chronic Care Management (AMB) (Signed)
  Chronic Care Management   Outreach Note  09/07/2021 Name: [name] MRN: [id number] DOB: 02/10/1950  [name] is a 71 y.o. year old female who is a primary care patient of Sharion Balloon, FNP. I reached out to [name] by phone today in response to a referral sent by Ms. [name]'s primary care provider.  An unsuccessful telephone outreach was attempted today. The patient was referred to the case management team for assistance with care management and care coordination.   Follow Up Plan: A HIPAA compliant phone message was left for the patient providing contact information and requesting a return call.  The care management team will reach out to the patient again over the next 7 days.  If patient returns call to provider office, please advise to call [facility] * at [phone]*  Noreene Larsson, [place], [place] [address] Direct Dial: [phone] [email or web address]

## 2021-09-07 NOTE — Chronic Care Management (AMB) (Signed)
  Chronic Care Management   Note  09/07/2021 Name: [name] MRN: [id number] DOB: [birth year]  [name] is a 71 y.o. year old female who is a primary care patient of Sharion Balloon, FNP. I reached out to [name] by phone today in response to a referral sent by [name] PCP.  [name] was given information about Chronic Care Management services today including:  CCM service includes personalized support from designated clinical staff supervised by her physician, including individualized plan of care and coordination with other care providers 24/7 contact phone numbers for assistance for urgent and routine care needs. Service will only be billed when office clinical staff spend 20 minutes or more in a month to coordinate care. Only one practitioner may furnish and bill the service in a calendar month. The patient may stop CCM services at any time (effective at the end of the month) by phone call to the office staff. The patient is responsible for co-pay (up to 20% after annual deductible is met) if co-pay is required by the individual health plan.   Patient agreed to services and verbal consent obtained.   Follow up plan: Face to Face appointment with care management team member scheduled for: 09/22/2021  Noreene Larsson, [place], [place] [address] Direct Dial: [phone] [email or web address]

## 2021-09-22 ENCOUNTER — Telehealth: Payer: Self-pay | Admitting: Pharmacist

## 2021-09-22 ENCOUNTER — Ambulatory Visit: Payer: Medicare HMO | Admitting: Pharmacist

## 2021-09-22 DIAGNOSIS — E1169 Type 2 diabetes mellitus with other specified complication: Secondary | ICD-10-CM

## 2021-09-22 DIAGNOSIS — E782 Mixed hyperlipidemia: Secondary | ICD-10-CM

## 2021-09-22 NOTE — Telephone Encounter (Signed)
Patient requesting referral to Dr. Forde Dandy ([facility]) for endocrine--her family goes there and she is familiar

## 2021-09-23 NOTE — Telephone Encounter (Signed)
Referral placed.

## 2021-09-30 DIAGNOSIS — E119 Type 2 diabetes mellitus without complications: Secondary | ICD-10-CM | POA: Diagnosis not present

## 2021-09-30 DIAGNOSIS — R7989 Other specified abnormal findings of blood chemistry: Secondary | ICD-10-CM | POA: Diagnosis not present

## 2021-09-30 DIAGNOSIS — I10 Essential (primary) hypertension: Secondary | ICD-10-CM | POA: Diagnosis not present

## 2021-09-30 DIAGNOSIS — E1169 Type 2 diabetes mellitus with other specified complication: Secondary | ICD-10-CM | POA: Diagnosis not present

## 2021-09-30 DIAGNOSIS — K219 Gastro-esophageal reflux disease without esophagitis: Secondary | ICD-10-CM | POA: Diagnosis not present

## 2021-09-30 DIAGNOSIS — N393 Stress incontinence (female) (male): Secondary | ICD-10-CM | POA: Diagnosis not present

## 2021-09-30 DIAGNOSIS — E785 Hyperlipidemia, unspecified: Secondary | ICD-10-CM | POA: Diagnosis not present

## 2021-10-08 ENCOUNTER — Ambulatory Visit: Payer: Medicare HMO | Admitting: Family

## 2021-10-12 NOTE — Progress Notes (Signed)
    09/22/2021 Name: [name] MRN: [id number] DOB: [birth year]   S:  75 yoF Presents for type 2 diabetes evaluation, education, and management Patient was referred and last seen by Primary Care Provider on 09/06/21.   She was newly diagnosed with T2DM at her most recent PCP visit. Patient reports Diabetes was diagnosed in 2023.  Insurance coverage/medication affordability: aetna medicare  Patient reports adherence with medications. Current diabetes medications include: newly started metformin Current hypertension medications include: hctz, metoprolol Goal 130/80 Current hyperlipidemia medications include: atorvastatin  Lipid Panel     Component Value Date/Time   CHOL 165 08/05/2021 1241   TRIG 152 (H) 08/05/2021 1241   HDL 47 08/05/2021 1241   CHOLHDL 3.5 08/05/2021 1241   LDLCALC 92 08/05/2021 1241   LABVLDL 26 08/05/2021 1241     Patient reported dietary habits: Eats 2-3 meals/day Discussed meal planning options and Plate method for healthy eating Avoid sugary drinks and desserts Incorporate balanced protein, non starchy veggies, 1 serving of carbohydrate with each meal Increase water intake Increase physical activity as able   Patient-reported exercise habits: recently started walking -- completes 3-5 days/week   Patient reports nocturia (nighttime urination).  Patient denies neuropathy (nerve pain).  Patient reports visual changes.  Patient reports self foot exams.    O:  Lab Results  Component Value Date   HGBA1C 14.0 (H) 09/06/2021    There were no vitals filed for this visit.     Lipid Panel     Component Value Date/Time   CHOL 165 08/05/2021 1241   TRIG 152 (H) 08/05/2021 1241   HDL 47 08/05/2021 1241   CHOLHDL 3.5 08/05/2021 1241   LDLCALC 92 08/05/2021 1241     Home fasting blood sugars: <180  2 hour post-meal/random blood sugars: up to 200s.    Clinical Atherosclerotic Cardiovascular Disease (ASCVD): No   The 10-year ASCVD risk  score (Arnett DK, et al., 2019) is: 21.5%   Values used to calculate the score:     Age: 14 years     Sex: Female     Is Non-Hispanic African American: No     Diabetic: Yes     Tobacco smoker: No     Systolic Blood Pressure: 209 mmHg     Is BP treated: Yes     HDL Cholesterol: 47 mg/dL     Total Cholesterol: 165 mg/dL    A/P:  New onset T2DM--A1c 14.0% on 09/06/21 Patient is requesting referral to Dr. Forde Dandy (endocrine in [place])--message sent ot PCP  -Continue metformin-consider increase in dose to twice daily --blood sugar responding appropriately  -Would favor SGLT2  vs GLP1 therapy if metformin not tolerated   -Extensive counseling on carb control and healthy plate method  -Extensively discussed pathophysiology of diabetes, recommended lifestyle interventions, dietary effects on blood sugar control  -Counseled on s/sx of and management of hypoglycemia  Written patient instructions provided.  Total time in face to face counseling 45 minutes.   Patient will follow with Endocrine   Regina Eck, PharmD, BCPS Clinical Pharmacist, [facility]  II Phone [phone]

## 2021-10-20 DIAGNOSIS — E119 Type 2 diabetes mellitus without complications: Secondary | ICD-10-CM | POA: Diagnosis not present

## 2021-10-20 DIAGNOSIS — Z23 Encounter for immunization: Secondary | ICD-10-CM | POA: Diagnosis not present

## 2021-10-20 DIAGNOSIS — E785 Hyperlipidemia, unspecified: Secondary | ICD-10-CM | POA: Diagnosis not present

## 2021-10-20 DIAGNOSIS — I10 Essential (primary) hypertension: Secondary | ICD-10-CM | POA: Diagnosis not present

## 2021-11-16 ENCOUNTER — Other Ambulatory Visit: Payer: Self-pay | Admitting: Family

## 2021-11-16 DIAGNOSIS — I10 Essential (primary) hypertension: Secondary | ICD-10-CM

## 2021-11-18 ENCOUNTER — Other Ambulatory Visit: Payer: Self-pay | Admitting: Family

## 2021-12-28 DIAGNOSIS — E785 Hyperlipidemia, unspecified: Secondary | ICD-10-CM | POA: Diagnosis not present

## 2021-12-28 DIAGNOSIS — R7989 Other specified abnormal findings of blood chemistry: Secondary | ICD-10-CM | POA: Diagnosis not present

## 2021-12-28 DIAGNOSIS — E119 Type 2 diabetes mellitus without complications: Secondary | ICD-10-CM | POA: Diagnosis not present

## 2021-12-28 DIAGNOSIS — K219 Gastro-esophageal reflux disease without esophagitis: Secondary | ICD-10-CM | POA: Diagnosis not present

## 2021-12-28 DIAGNOSIS — N393 Stress incontinence (female) (male): Secondary | ICD-10-CM | POA: Diagnosis not present

## 2021-12-28 DIAGNOSIS — I10 Essential (primary) hypertension: Secondary | ICD-10-CM | POA: Diagnosis not present

## 2022-02-04 DIAGNOSIS — M47812 Spondylosis without myelopathy or radiculopathy, cervical region: Secondary | ICD-10-CM | POA: Diagnosis not present

## 2022-02-04 DIAGNOSIS — M542 Cervicalgia: Secondary | ICD-10-CM | POA: Diagnosis not present

## 2022-02-10 DIAGNOSIS — E785 Hyperlipidemia, unspecified: Secondary | ICD-10-CM | POA: Diagnosis not present

## 2022-02-10 DIAGNOSIS — I10 Essential (primary) hypertension: Secondary | ICD-10-CM | POA: Diagnosis not present

## 2022-02-10 DIAGNOSIS — E119 Type 2 diabetes mellitus without complications: Secondary | ICD-10-CM | POA: Diagnosis not present

## 2022-02-14 ENCOUNTER — Other Ambulatory Visit: Payer: Self-pay | Admitting: Family

## 2022-02-14 DIAGNOSIS — E8881 Metabolic syndrome: Secondary | ICD-10-CM

## 2022-02-14 DIAGNOSIS — I10 Essential (primary) hypertension: Secondary | ICD-10-CM

## 2022-02-14 DIAGNOSIS — E782 Mixed hyperlipidemia: Secondary | ICD-10-CM

## 2022-02-15 ENCOUNTER — Emergency Department (HOSPITAL_COMMUNITY): Payer: Medicare HMO

## 2022-02-15 ENCOUNTER — Other Ambulatory Visit: Payer: Self-pay

## 2022-02-15 ENCOUNTER — Emergency Department (HOSPITAL_COMMUNITY)
Admission: EM | Admit: 2022-02-15 | Discharge: 2022-02-15 | Disposition: A | Discharge status: Home / Self Care | Payer: Medicare HMO | Attending: Emergency Medicine | Admitting: Emergency Medicine

## 2022-02-15 DIAGNOSIS — Z7982 Long term (current) use of aspirin: Secondary | ICD-10-CM | POA: Insufficient documentation

## 2022-02-15 DIAGNOSIS — Z79899 Other long term (current) drug therapy: Secondary | ICD-10-CM | POA: Insufficient documentation

## 2022-02-15 DIAGNOSIS — Y92410 Unspecified street and highway as the place of occurrence of the external cause: Secondary | ICD-10-CM | POA: Diagnosis not present

## 2022-02-15 DIAGNOSIS — M25512 Pain in left shoulder: Secondary | ICD-10-CM | POA: Insufficient documentation

## 2022-02-15 DIAGNOSIS — R519 Headache, unspecified: Secondary | ICD-10-CM | POA: Diagnosis not present

## 2022-02-15 DIAGNOSIS — I10 Essential (primary) hypertension: Secondary | ICD-10-CM | POA: Diagnosis not present

## 2022-02-15 DIAGNOSIS — Z041 Encounter for examination and observation following transport accident: Secondary | ICD-10-CM | POA: Diagnosis not present

## 2022-02-15 DIAGNOSIS — M19012 Primary osteoarthritis, left shoulder: Secondary | ICD-10-CM | POA: Diagnosis not present

## 2022-02-15 DIAGNOSIS — S0990XA Unspecified injury of head, initial encounter: Secondary | ICD-10-CM | POA: Diagnosis not present
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
## 2022-02-15 NOTE — Discharge Instructions (Addendum)
Your workups were reassuring today.  Please take tylenol/ibuprofen, use ice/heat packs for pain. I recommend close follow-up with PCP for reevaluation.  Please do not hesitate to return to emergency department if worrisome signs symptoms we discussed become apparent.
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
## 2022-02-22 ENCOUNTER — Telehealth: Payer: Self-pay
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
## 2022-04-05 DIAGNOSIS — H5203 Hypermetropia, bilateral: Secondary | ICD-10-CM | POA: Diagnosis not present

## 2022-04-25 ENCOUNTER — Encounter: Payer: Self-pay | Admitting: Endocrinology

## 2022-04-27 ENCOUNTER — Encounter: Payer: Self-pay | Admitting: Endocrinology

## 2022-04-28 ENCOUNTER — Other Ambulatory Visit: Payer: Self-pay | Admitting: Endocrinology

## 2022-04-28 DIAGNOSIS — Z1231 Encounter for screening mammogram for malignant neoplasm of breast: Secondary | ICD-10-CM

## 2022-05-02 DIAGNOSIS — E119 Type 2 diabetes mellitus without complications: Secondary | ICD-10-CM | POA: Diagnosis not present

## 2022-05-02 DIAGNOSIS — E785 Hyperlipidemia, unspecified: Secondary | ICD-10-CM | POA: Diagnosis not present

## 2022-05-02 DIAGNOSIS — G319 Degenerative disease of nervous system, unspecified: Secondary | ICD-10-CM | POA: Diagnosis not present

## 2022-05-02 DIAGNOSIS — R7989 Other specified abnormal findings of blood chemistry: Secondary | ICD-10-CM | POA: Diagnosis not present

## 2022-05-02 DIAGNOSIS — M542 Cervicalgia: Secondary | ICD-10-CM | POA: Diagnosis not present

## 2022-05-02 DIAGNOSIS — N393 Stress incontinence (female) (male): Secondary | ICD-10-CM | POA: Diagnosis not present

## 2022-05-02 DIAGNOSIS — K219 Gastro-esophageal reflux disease without esophagitis: Secondary | ICD-10-CM | POA: Diagnosis not present

## 2022-05-02 DIAGNOSIS — I10 Essential (primary) hypertension: Secondary | ICD-10-CM | POA: Diagnosis not present

## 2022-06-01 DIAGNOSIS — L821 Other seborrheic keratosis: Secondary | ICD-10-CM | POA: Diagnosis not present

## 2022-06-01 DIAGNOSIS — D485 Neoplasm of uncertain behavior of skin: Secondary | ICD-10-CM | POA: Diagnosis not present

## 2022-06-09 ENCOUNTER — Ambulatory Visit
Admission: RE | Admit: 2022-06-09 | Discharge: 2022-06-09 | Disposition: A | Discharge status: Home / Self Care | Payer: Medicare HMO | Source: Ambulatory Visit | Attending: Endocrinology | Admitting: Endocrinology

## 2022-06-09 DIAGNOSIS — Z1231 Encounter for screening mammogram for malignant neoplasm of breast: Secondary | ICD-10-CM | POA: Diagnosis not present

## 2022-08-03 ENCOUNTER — Other Ambulatory Visit: Payer: Self-pay | Admitting: Family

## 2022-08-03 DIAGNOSIS — E1169 Type 2 diabetes mellitus with other specified complication: Secondary | ICD-10-CM

## 2022-08-29 DIAGNOSIS — I10 Essential (primary) hypertension: Secondary | ICD-10-CM | POA: Diagnosis not present

## 2022-08-29 DIAGNOSIS — E785 Hyperlipidemia, unspecified: Secondary | ICD-10-CM | POA: Diagnosis not present

## 2022-08-29 DIAGNOSIS — E119 Type 2 diabetes mellitus without complications: Secondary | ICD-10-CM | POA: Diagnosis not present

## 2022-09-06 DIAGNOSIS — G319 Degenerative disease of nervous system, unspecified: Secondary | ICD-10-CM | POA: Diagnosis not present

## 2022-09-06 DIAGNOSIS — M542 Cervicalgia: Secondary | ICD-10-CM | POA: Diagnosis not present

## 2022-09-06 DIAGNOSIS — I10 Essential (primary) hypertension: Secondary | ICD-10-CM | POA: Diagnosis not present

## 2022-09-06 DIAGNOSIS — R82998 Other abnormal findings in urine: Secondary | ICD-10-CM | POA: Diagnosis not present

## 2022-09-06 DIAGNOSIS — K219 Gastro-esophageal reflux disease without esophagitis: Secondary | ICD-10-CM | POA: Diagnosis not present

## 2022-09-06 DIAGNOSIS — R7989 Other specified abnormal findings of blood chemistry: Secondary | ICD-10-CM | POA: Diagnosis not present

## 2022-09-06 DIAGNOSIS — E785 Hyperlipidemia, unspecified: Secondary | ICD-10-CM | POA: Diagnosis not present

## 2022-09-06 DIAGNOSIS — E119 Type 2 diabetes mellitus without complications: Secondary | ICD-10-CM | POA: Diagnosis not present

## 2022-09-06 DIAGNOSIS — N393 Stress incontinence (female) (male): Secondary | ICD-10-CM | POA: Diagnosis not present

## 2023-01-25 DIAGNOSIS — E785 Hyperlipidemia, unspecified: Secondary | ICD-10-CM | POA: Diagnosis not present

## 2023-01-25 DIAGNOSIS — I10 Essential (primary) hypertension: Secondary | ICD-10-CM | POA: Diagnosis not present

## 2023-01-25 DIAGNOSIS — N393 Stress incontinence (female) (male): Secondary | ICD-10-CM | POA: Diagnosis not present

## 2023-01-25 DIAGNOSIS — R7989 Other specified abnormal findings of blood chemistry: Secondary | ICD-10-CM | POA: Diagnosis not present

## 2023-01-25 DIAGNOSIS — E119 Type 2 diabetes mellitus without complications: Secondary | ICD-10-CM | POA: Diagnosis not present

## 2023-01-25 DIAGNOSIS — K219 Gastro-esophageal reflux disease without esophagitis: Secondary | ICD-10-CM | POA: Diagnosis not present

## 2023-01-25 DIAGNOSIS — G319 Degenerative disease of nervous system, unspecified: Secondary | ICD-10-CM | POA: Diagnosis not present

## 2023-05-03 ENCOUNTER — Other Ambulatory Visit: Payer: Self-pay | Admitting: Endocrinology

## 2023-05-03 DIAGNOSIS — Z0000 Encounter for general adult medical examination without abnormal findings: Secondary | ICD-10-CM

## 2023-05-11 DIAGNOSIS — E119 Type 2 diabetes mellitus without complications: Secondary | ICD-10-CM | POA: Diagnosis not present

## 2023-05-23 DIAGNOSIS — G319 Degenerative disease of nervous system, unspecified: Secondary | ICD-10-CM | POA: Diagnosis not present

## 2023-05-23 DIAGNOSIS — I10 Essential (primary) hypertension: Secondary | ICD-10-CM | POA: Diagnosis not present

## 2023-05-23 DIAGNOSIS — K219 Gastro-esophageal reflux disease without esophagitis: Secondary | ICD-10-CM | POA: Diagnosis not present

## 2023-05-23 DIAGNOSIS — E785 Hyperlipidemia, unspecified: Secondary | ICD-10-CM | POA: Diagnosis not present

## 2023-05-23 DIAGNOSIS — N393 Stress incontinence (female) (male): Secondary | ICD-10-CM | POA: Diagnosis not present

## 2023-05-23 DIAGNOSIS — E119 Type 2 diabetes mellitus without complications: Secondary | ICD-10-CM | POA: Diagnosis not present

## 2023-05-23 DIAGNOSIS — R7989 Other specified abnormal findings of blood chemistry: Secondary | ICD-10-CM | POA: Diagnosis not present

## 2023-06-13 ENCOUNTER — Ambulatory Visit

## 2023-06-13 DIAGNOSIS — M25551 Pain in right hip: Secondary | ICD-10-CM | POA: Diagnosis not present

## 2023-06-13 DIAGNOSIS — M25561 Pain in right knee: Secondary | ICD-10-CM | POA: Diagnosis not present

## 2023-06-13 DIAGNOSIS — M5441 Lumbago with sciatica, right side: Secondary | ICD-10-CM | POA: Diagnosis not present

## 2023-06-15 ENCOUNTER — Ambulatory Visit

## 2023-07-04 DIAGNOSIS — M1711 Unilateral primary osteoarthritis, right knee: Secondary | ICD-10-CM | POA: Diagnosis not present

## 2023-07-04 DIAGNOSIS — M25551 Pain in right hip: Secondary | ICD-10-CM | POA: Diagnosis not present

## 2023-07-06 ENCOUNTER — Ambulatory Visit

## 2023-08-02 ENCOUNTER — Ambulatory Visit
Admission: RE | Admit: 2023-08-02 | Discharge: 2023-08-02 | Disposition: A | Discharge status: Home / Self Care | Source: Ambulatory Visit | Attending: Endocrinology | Admitting: Endocrinology

## 2023-08-02 DIAGNOSIS — Z1231 Encounter for screening mammogram for malignant neoplasm of breast: Secondary | ICD-10-CM | POA: Diagnosis not present

## 2023-08-02 DIAGNOSIS — Z0000 Encounter for general adult medical examination without abnormal findings: Secondary | ICD-10-CM

## 2023-08-11 DIAGNOSIS — M9905 Segmental and somatic dysfunction of pelvic region: Secondary | ICD-10-CM | POA: Diagnosis not present

## 2023-08-11 DIAGNOSIS — M9902 Segmental and somatic dysfunction of thoracic region: Secondary | ICD-10-CM | POA: Diagnosis not present

## 2023-08-11 DIAGNOSIS — M9903 Segmental and somatic dysfunction of lumbar region: Secondary | ICD-10-CM | POA: Diagnosis not present

## 2023-08-11 DIAGNOSIS — M5441 Lumbago with sciatica, right side: Secondary | ICD-10-CM | POA: Diagnosis not present

## 2023-08-14 DIAGNOSIS — M5441 Lumbago with sciatica, right side: Secondary | ICD-10-CM | POA: Diagnosis not present

## 2023-08-14 DIAGNOSIS — M9905 Segmental and somatic dysfunction of pelvic region: Secondary | ICD-10-CM | POA: Diagnosis not present

## 2023-08-14 DIAGNOSIS — M9902 Segmental and somatic dysfunction of thoracic region: Secondary | ICD-10-CM | POA: Diagnosis not present

## 2023-08-14 DIAGNOSIS — M9903 Segmental and somatic dysfunction of lumbar region: Secondary | ICD-10-CM | POA: Diagnosis not present

## 2023-08-16 DIAGNOSIS — M5441 Lumbago with sciatica, right side: Secondary | ICD-10-CM | POA: Diagnosis not present

## 2023-08-16 DIAGNOSIS — M9902 Segmental and somatic dysfunction of thoracic region: Secondary | ICD-10-CM | POA: Diagnosis not present

## 2023-08-16 DIAGNOSIS — M9905 Segmental and somatic dysfunction of pelvic region: Secondary | ICD-10-CM | POA: Diagnosis not present

## 2023-08-16 DIAGNOSIS — M9903 Segmental and somatic dysfunction of lumbar region: Secondary | ICD-10-CM | POA: Diagnosis not present

## 2023-08-21 DIAGNOSIS — M9902 Segmental and somatic dysfunction of thoracic region: Secondary | ICD-10-CM | POA: Diagnosis not present

## 2023-08-21 DIAGNOSIS — M9903 Segmental and somatic dysfunction of lumbar region: Secondary | ICD-10-CM | POA: Diagnosis not present

## 2023-08-21 DIAGNOSIS — M9905 Segmental and somatic dysfunction of pelvic region: Secondary | ICD-10-CM | POA: Diagnosis not present

## 2023-08-21 DIAGNOSIS — M5441 Lumbago with sciatica, right side: Secondary | ICD-10-CM | POA: Diagnosis not present

## 2023-08-23 DIAGNOSIS — M9902 Segmental and somatic dysfunction of thoracic region: Secondary | ICD-10-CM | POA: Diagnosis not present

## 2023-08-23 DIAGNOSIS — M5441 Lumbago with sciatica, right side: Secondary | ICD-10-CM | POA: Diagnosis not present

## 2023-08-23 DIAGNOSIS — M9905 Segmental and somatic dysfunction of pelvic region: Secondary | ICD-10-CM | POA: Diagnosis not present

## 2023-08-23 DIAGNOSIS — M9903 Segmental and somatic dysfunction of lumbar region: Secondary | ICD-10-CM | POA: Diagnosis not present

## 2023-08-28 DIAGNOSIS — M9903 Segmental and somatic dysfunction of lumbar region: Secondary | ICD-10-CM | POA: Diagnosis not present

## 2023-08-28 DIAGNOSIS — M5441 Lumbago with sciatica, right side: Secondary | ICD-10-CM | POA: Diagnosis not present

## 2023-08-28 DIAGNOSIS — M9905 Segmental and somatic dysfunction of pelvic region: Secondary | ICD-10-CM | POA: Diagnosis not present

## 2023-08-28 DIAGNOSIS — M9902 Segmental and somatic dysfunction of thoracic region: Secondary | ICD-10-CM | POA: Diagnosis not present

## 2023-08-29 DIAGNOSIS — Z008 Encounter for other general examination: Secondary | ICD-10-CM | POA: Diagnosis not present

## 2023-09-04 DIAGNOSIS — M5441 Lumbago with sciatica, right side: Secondary | ICD-10-CM | POA: Diagnosis not present

## 2023-09-04 DIAGNOSIS — M9902 Segmental and somatic dysfunction of thoracic region: Secondary | ICD-10-CM | POA: Diagnosis not present

## 2023-09-04 DIAGNOSIS — M9903 Segmental and somatic dysfunction of lumbar region: Secondary | ICD-10-CM | POA: Diagnosis not present

## 2023-09-04 DIAGNOSIS — M9905 Segmental and somatic dysfunction of pelvic region: Secondary | ICD-10-CM | POA: Diagnosis not present

## 2023-09-06 DIAGNOSIS — M9905 Segmental and somatic dysfunction of pelvic region: Secondary | ICD-10-CM | POA: Diagnosis not present

## 2023-09-06 DIAGNOSIS — M5441 Lumbago with sciatica, right side: Secondary | ICD-10-CM | POA: Diagnosis not present

## 2023-09-06 DIAGNOSIS — M9903 Segmental and somatic dysfunction of lumbar region: Secondary | ICD-10-CM | POA: Diagnosis not present

## 2023-09-06 DIAGNOSIS — M9902 Segmental and somatic dysfunction of thoracic region: Secondary | ICD-10-CM | POA: Diagnosis not present

## 2023-09-11 DIAGNOSIS — M9903 Segmental and somatic dysfunction of lumbar region: Secondary | ICD-10-CM | POA: Diagnosis not present

## 2023-09-11 DIAGNOSIS — M5441 Lumbago with sciatica, right side: Secondary | ICD-10-CM | POA: Diagnosis not present

## 2023-09-11 DIAGNOSIS — M9905 Segmental and somatic dysfunction of pelvic region: Secondary | ICD-10-CM | POA: Diagnosis not present

## 2023-09-11 DIAGNOSIS — M9902 Segmental and somatic dysfunction of thoracic region: Secondary | ICD-10-CM | POA: Diagnosis not present

## 2023-09-13 DIAGNOSIS — M9902 Segmental and somatic dysfunction of thoracic region: Secondary | ICD-10-CM | POA: Diagnosis not present

## 2023-09-13 DIAGNOSIS — M9903 Segmental and somatic dysfunction of lumbar region: Secondary | ICD-10-CM | POA: Diagnosis not present

## 2023-09-13 DIAGNOSIS — M5441 Lumbago with sciatica, right side: Secondary | ICD-10-CM | POA: Diagnosis not present

## 2023-09-13 DIAGNOSIS — M9905 Segmental and somatic dysfunction of pelvic region: Secondary | ICD-10-CM | POA: Diagnosis not present

## 2023-09-22 DIAGNOSIS — M9903 Segmental and somatic dysfunction of lumbar region: Secondary | ICD-10-CM | POA: Diagnosis not present

## 2023-09-22 DIAGNOSIS — M9902 Segmental and somatic dysfunction of thoracic region: Secondary | ICD-10-CM | POA: Diagnosis not present

## 2023-09-22 DIAGNOSIS — M5441 Lumbago with sciatica, right side: Secondary | ICD-10-CM | POA: Diagnosis not present

## 2023-09-22 DIAGNOSIS — M9905 Segmental and somatic dysfunction of pelvic region: Secondary | ICD-10-CM | POA: Diagnosis not present

## 2023-09-25 DIAGNOSIS — Z23 Encounter for immunization: Secondary | ICD-10-CM | POA: Diagnosis not present

## 2023-09-25 DIAGNOSIS — R7989 Other specified abnormal findings of blood chemistry: Secondary | ICD-10-CM | POA: Diagnosis not present

## 2023-09-25 DIAGNOSIS — E119 Type 2 diabetes mellitus without complications: Secondary | ICD-10-CM | POA: Diagnosis not present

## 2023-09-25 DIAGNOSIS — I10 Essential (primary) hypertension: Secondary | ICD-10-CM | POA: Diagnosis not present

## 2023-09-25 DIAGNOSIS — Z0000 Encounter for general adult medical examination without abnormal findings: Secondary | ICD-10-CM | POA: Diagnosis not present

## 2023-09-25 DIAGNOSIS — M25561 Pain in right knee: Secondary | ICD-10-CM | POA: Diagnosis not present

## 2023-09-25 DIAGNOSIS — M542 Cervicalgia: Secondary | ICD-10-CM | POA: Diagnosis not present

## 2023-09-25 DIAGNOSIS — N393 Stress incontinence (female) (male): Secondary | ICD-10-CM | POA: Diagnosis not present

## 2023-09-25 DIAGNOSIS — K219 Gastro-esophageal reflux disease without esophagitis: Secondary | ICD-10-CM | POA: Diagnosis not present

## 2023-09-25 DIAGNOSIS — R635 Abnormal weight gain: Secondary | ICD-10-CM | POA: Diagnosis not present

## 2023-09-25 DIAGNOSIS — G319 Degenerative disease of nervous system, unspecified: Secondary | ICD-10-CM | POA: Diagnosis not present

## 2023-09-25 DIAGNOSIS — E785 Hyperlipidemia, unspecified: Secondary | ICD-10-CM | POA: Diagnosis not present

## 2023-10-06 DIAGNOSIS — M9902 Segmental and somatic dysfunction of thoracic region: Secondary | ICD-10-CM | POA: Diagnosis not present

## 2023-10-06 DIAGNOSIS — M5441 Lumbago with sciatica, right side: Secondary | ICD-10-CM | POA: Diagnosis not present

## 2023-10-06 DIAGNOSIS — M9905 Segmental and somatic dysfunction of pelvic region: Secondary | ICD-10-CM | POA: Diagnosis not present

## 2023-10-06 DIAGNOSIS — M9903 Segmental and somatic dysfunction of lumbar region: Secondary | ICD-10-CM | POA: Diagnosis not present

## 2023-11-13 DIAGNOSIS — M9903 Segmental and somatic dysfunction of lumbar region: Secondary | ICD-10-CM | POA: Diagnosis not present

## 2023-11-13 DIAGNOSIS — M9905 Segmental and somatic dysfunction of pelvic region: Secondary | ICD-10-CM | POA: Diagnosis not present

## 2023-11-13 DIAGNOSIS — M5441 Lumbago with sciatica, right side: Secondary | ICD-10-CM | POA: Diagnosis not present

## 2023-11-13 DIAGNOSIS — M9902 Segmental and somatic dysfunction of thoracic region: Secondary | ICD-10-CM | POA: Diagnosis not present

## 2024-01-29 NOTE — Progress Notes (Signed)
 [name]                                          MRN: [id number]   01/29/2024   The [facility] Quality Team Specialist reviewed this patient medical record for the purposes of chart review for care gap closure. The following were reviewed: chart review for care gap closure-glycemic status assessment.    [facility] Quality Team
